# Patient Record
Sex: Female | Born: 1935 | Race: Black or African American | Hispanic: No | Smoking: Never smoker
Health system: Southern US, Community
[De-identification: ages and names within clinical notes are randomized; demographics above are authoritative.]

## PROBLEM LIST (undated history)

## (undated) DIAGNOSIS — H269 Unspecified cataract: Secondary | ICD-10-CM

## (undated) DIAGNOSIS — E785 Hyperlipidemia, unspecified: Secondary | ICD-10-CM

## (undated) DIAGNOSIS — I10 Essential (primary) hypertension: Secondary | ICD-10-CM

## (undated) HISTORY — PX: BILATERAL OOPHORECTOMY: SHX1221

## (undated) HISTORY — PX: APPENDECTOMY: SHX54

## (undated) HISTORY — DX: Unspecified cataract: H26.9

## (undated) HISTORY — DX: Hyperlipidemia, unspecified: E78.5

## (undated) HISTORY — PX: ABDOMINAL HYSTERECTOMY: SHX81

---

## 1994-03-04 HISTORY — PX: CATARACT EXTRACTION, BILATERAL: SHX1313

## 1997-11-30 ENCOUNTER — Other Ambulatory Visit: Admission: RE | Admit: 1997-11-30 | Discharge: 1997-11-30 | Payer: Self-pay | Admitting: Obstetrics & Gynecology

## 1998-12-01 ENCOUNTER — Other Ambulatory Visit: Admission: RE | Admit: 1998-12-01 | Discharge: 1998-12-01 | Payer: Self-pay | Admitting: Obstetrics & Gynecology

## 1999-01-14 ENCOUNTER — Emergency Department (HOSPITAL_COMMUNITY): Admission: EM | Admit: 1999-01-14 | Discharge: 1999-01-14 | Payer: Self-pay | Admitting: Emergency Medicine

## 1999-09-04 ENCOUNTER — Encounter (INDEPENDENT_AMBULATORY_CARE_PROVIDER_SITE_OTHER): Payer: Self-pay | Admitting: Specialist

## 1999-09-04 ENCOUNTER — Other Ambulatory Visit: Admission: RE | Admit: 1999-09-04 | Discharge: 1999-09-04 | Payer: Self-pay | Admitting: Internal Medicine

## 1999-12-03 ENCOUNTER — Other Ambulatory Visit: Admission: RE | Admit: 1999-12-03 | Discharge: 1999-12-03 | Payer: Self-pay | Admitting: Obstetrics & Gynecology

## 2000-01-09 ENCOUNTER — Emergency Department (HOSPITAL_COMMUNITY): Admission: EM | Admit: 2000-01-09 | Discharge: 2000-01-09 | Payer: Self-pay | Admitting: Emergency Medicine

## 2000-01-09 ENCOUNTER — Encounter: Payer: Self-pay | Admitting: Emergency Medicine

## 2000-06-26 ENCOUNTER — Emergency Department (HOSPITAL_COMMUNITY): Admission: EM | Admit: 2000-06-26 | Discharge: 2000-06-26 | Payer: Self-pay | Admitting: Internal Medicine

## 2001-01-08 ENCOUNTER — Other Ambulatory Visit: Admission: RE | Admit: 2001-01-08 | Discharge: 2001-01-08 | Payer: Self-pay | Admitting: Obstetrics & Gynecology

## 2002-02-01 ENCOUNTER — Other Ambulatory Visit: Admission: RE | Admit: 2002-02-01 | Discharge: 2002-02-01 | Payer: Self-pay | Admitting: Obstetrics & Gynecology

## 2003-03-18 ENCOUNTER — Other Ambulatory Visit: Admission: RE | Admit: 2003-03-18 | Discharge: 2003-03-18 | Payer: Self-pay | Admitting: Obstetrics & Gynecology

## 2003-03-24 ENCOUNTER — Emergency Department (HOSPITAL_COMMUNITY): Admission: AD | Admit: 2003-03-24 | Discharge: 2003-03-24 | Payer: Self-pay | Admitting: Emergency Medicine

## 2003-11-01 ENCOUNTER — Encounter: Admission: RE | Admit: 2003-11-01 | Discharge: 2003-11-01 | Payer: Self-pay | Admitting: Internal Medicine

## 2004-09-06 ENCOUNTER — Ambulatory Visit: Payer: Self-pay | Admitting: Internal Medicine

## 2004-09-18 ENCOUNTER — Encounter (INDEPENDENT_AMBULATORY_CARE_PROVIDER_SITE_OTHER): Payer: Self-pay | Admitting: Specialist

## 2004-09-18 ENCOUNTER — Ambulatory Visit: Payer: Self-pay | Admitting: Internal Medicine

## 2004-09-18 ENCOUNTER — Encounter (INDEPENDENT_AMBULATORY_CARE_PROVIDER_SITE_OTHER): Payer: Self-pay | Admitting: *Deleted

## 2005-04-11 ENCOUNTER — Other Ambulatory Visit: Admission: RE | Admit: 2005-04-11 | Discharge: 2005-04-11 | Payer: Self-pay | Admitting: Obstetrics & Gynecology

## 2006-02-22 ENCOUNTER — Emergency Department (HOSPITAL_COMMUNITY): Admission: EM | Admit: 2006-02-22 | Discharge: 2006-02-22 | Payer: Self-pay | Admitting: Emergency Medicine

## 2006-04-19 ENCOUNTER — Emergency Department (HOSPITAL_COMMUNITY): Admission: EM | Admit: 2006-04-19 | Discharge: 2006-04-19 | Payer: Self-pay | Admitting: Family Medicine

## 2006-10-11 ENCOUNTER — Emergency Department (HOSPITAL_COMMUNITY): Admission: EM | Admit: 2006-10-11 | Discharge: 2006-10-11 | Payer: Self-pay | Admitting: Family Medicine

## 2008-02-10 ENCOUNTER — Emergency Department (HOSPITAL_COMMUNITY): Admission: EM | Admit: 2008-02-10 | Discharge: 2008-02-10 | Payer: Self-pay | Admitting: Emergency Medicine

## 2009-02-07 ENCOUNTER — Emergency Department (HOSPITAL_COMMUNITY): Admission: EM | Admit: 2009-02-07 | Discharge: 2009-02-07 | Payer: Self-pay | Admitting: Emergency Medicine

## 2009-02-19 ENCOUNTER — Emergency Department (HOSPITAL_COMMUNITY): Admission: EM | Admit: 2009-02-19 | Discharge: 2009-02-19 | Payer: Self-pay | Admitting: Emergency Medicine

## 2009-03-18 ENCOUNTER — Encounter: Admission: RE | Admit: 2009-03-18 | Discharge: 2009-03-18 | Payer: Self-pay | Admitting: Orthopedic Surgery

## 2009-08-15 ENCOUNTER — Encounter (INDEPENDENT_AMBULATORY_CARE_PROVIDER_SITE_OTHER): Payer: Self-pay | Admitting: *Deleted

## 2010-02-21 ENCOUNTER — Emergency Department (HOSPITAL_COMMUNITY)
Admission: EM | Admit: 2010-02-21 | Discharge: 2010-02-21 | Payer: Self-pay | Source: Home / Self Care | Admitting: Family Medicine

## 2010-03-06 ENCOUNTER — Encounter
Admission: RE | Admit: 2010-03-06 | Discharge: 2010-03-06 | Payer: Self-pay | Source: Home / Self Care | Attending: Family Medicine | Admitting: Family Medicine

## 2010-04-03 NOTE — Letter (Signed)
Summary: Colonoscopy Date Change Letter  [facility]  [address] [place], [place] [address]   Phone: [phone]  Fax: [phone]      August 15, 2009 MRN: [id number]   Abbygail Ahrendt [address] [place], [place]  [address]   Dear Ms. Karczewski,   Previously you were recommended to have a repeat colonoscopy around this time. Your chart was recently reviewed by Dr. Lina Sar of [facility]. Follow up colonoscopy is now recommended in July 2013. This revised recommendation is based on current, nationally recognized guidelines for colorectal cancer screening and polyp surveillance. These guidelines are endorsed by the American Cancer Society, [employer] on Colorectal Cancer as well as numerous other major medical organizations.  Please understand that our recommendation assumes that you do not have any new symptoms such as bleeding, a change in bowel habits, anemia, or significant abdominal discomfort. If you do have any concerning GI symptoms or want to discuss the guideline recommendations, please call to arrange an office visit at your earliest convenience. Otherwise we will keep you in our reminder system and contact you 1-2 months prior to the date listed above to schedule your next colonoscopy.  Thank you,  Hedwig Morton. Juanda Chance, M.D.  [facility] [phone]

## 2010-04-20 ENCOUNTER — Emergency Department (HOSPITAL_COMMUNITY): Payer: Medicare Other

## 2010-04-20 ENCOUNTER — Emergency Department (HOSPITAL_COMMUNITY)
Admission: EM | Admit: 2010-04-20 | Discharge: 2010-04-20 | Disposition: A | Discharge status: Home / Self Care | Payer: Medicare Other | Attending: Emergency Medicine | Admitting: Emergency Medicine

## 2010-04-20 ENCOUNTER — Encounter (INDEPENDENT_AMBULATORY_CARE_PROVIDER_SITE_OTHER): Payer: Self-pay | Admitting: *Deleted

## 2010-04-20 DIAGNOSIS — K5900 Constipation, unspecified: Secondary | ICD-10-CM | POA: Insufficient documentation

## 2010-04-20 DIAGNOSIS — R110 Nausea: Secondary | ICD-10-CM | POA: Insufficient documentation

## 2010-04-20 DIAGNOSIS — I10 Essential (primary) hypertension: Secondary | ICD-10-CM | POA: Insufficient documentation

## 2010-04-20 DIAGNOSIS — R1011 Right upper quadrant pain: Secondary | ICD-10-CM | POA: Insufficient documentation

## 2010-04-20 DIAGNOSIS — R1033 Periumbilical pain: Secondary | ICD-10-CM | POA: Insufficient documentation

## 2010-04-20 LAB — COMPREHENSIVE METABOLIC PANEL
ALT: 10 U/L (ref 0–35)
Alkaline Phosphatase: 42 U/L (ref 39–117)
CO2: 31 mEq/L (ref 19–32)
Chloride: 101 mEq/L (ref 96–112)
Glucose, Bld: 111 mg/dL — ABNORMAL HIGH (ref 70–99)
Potassium: 4.1 mEq/L (ref 3.5–5.1)
Sodium: 140 mEq/L (ref 135–145)
Total Bilirubin: 1 mg/dL (ref 0.3–1.2)
Total Protein: 6.6 g/dL (ref 6.0–8.3)

## 2010-04-20 LAB — DIFFERENTIAL
Basophils Absolute: 0 10*3/uL (ref 0.0–0.1)
Basophils Relative: 0 % (ref 0–1)
Lymphocytes Relative: 28 % (ref 12–46)
Neutro Abs: 5.3 10*3/uL (ref 1.7–7.7)
Neutrophils Relative %: 63 % (ref 43–77)

## 2010-04-20 LAB — URINALYSIS, ROUTINE W REFLEX MICROSCOPIC
Bilirubin Urine: NEGATIVE
Hgb urine dipstick: NEGATIVE
Specific Gravity, Urine: 1.016 (ref 1.005–1.030)
Urobilinogen, UA: 1 mg/dL (ref 0.0–1.0)

## 2010-04-20 LAB — CBC
Hemoglobin: 13.7 g/dL (ref 12.0–15.0)
RBC: 5.17 MIL/uL — ABNORMAL HIGH (ref 3.87–5.11)
WBC: 8.4 10*3/uL (ref 4.0–10.5)

## 2010-04-20 MED ORDER — IOHEXOL 300 MG/ML  SOLN
100.0000 mL | Freq: Once | INTRAMUSCULAR | Status: AC | PRN
  Administered 2010-04-20: 100 mL via INTRAVENOUS

## 2010-05-14 LAB — URINE CULTURE: Culture  Setup Time: 201112211856

## 2010-05-14 LAB — POCT URINALYSIS DIPSTICK
Bilirubin Urine: NEGATIVE
Hgb urine dipstick: NEGATIVE
Ketones, ur: NEGATIVE mg/dL
Nitrite: NEGATIVE
pH: 6 (ref 5.0–8.0)

## 2010-05-14 LAB — URINALYSIS, ROUTINE W REFLEX MICROSCOPIC
Bilirubin Urine: NEGATIVE
Hgb urine dipstick: NEGATIVE
Specific Gravity, Urine: 1.014 (ref 1.005–1.030)
pH: 6 (ref 5.0–8.0)

## 2010-05-17 ENCOUNTER — Ambulatory Visit: Payer: Medicare Other | Admitting: Physician Assistant

## 2010-05-17 ENCOUNTER — Telehealth: Payer: Self-pay | Admitting: Internal Medicine

## 2010-05-18 ENCOUNTER — Encounter: Payer: Self-pay | Admitting: Internal Medicine

## 2010-05-18 ENCOUNTER — Encounter: Payer: Self-pay | Admitting: Physician Assistant

## 2010-05-18 ENCOUNTER — Other Ambulatory Visit: Payer: Self-pay | Admitting: Physician Assistant

## 2010-05-18 ENCOUNTER — Other Ambulatory Visit: Payer: Medicare Other

## 2010-05-18 ENCOUNTER — Ambulatory Visit (INDEPENDENT_AMBULATORY_CARE_PROVIDER_SITE_OTHER): Payer: Medicare Other | Admitting: Physician Assistant

## 2010-05-18 DIAGNOSIS — I10 Essential (primary) hypertension: Secondary | ICD-10-CM | POA: Insufficient documentation

## 2010-05-18 DIAGNOSIS — Z860100 Personal history of colon polyps, unspecified: Secondary | ICD-10-CM | POA: Insufficient documentation

## 2010-05-18 DIAGNOSIS — R1031 Right lower quadrant pain: Secondary | ICD-10-CM

## 2010-05-18 LAB — CBC WITH DIFFERENTIAL/PLATELET
Basophils Absolute: 0 10*3/uL (ref 0.0–0.1)
Eosinophils Absolute: 0.1 10*3/uL (ref 0.0–0.7)
Lymphocytes Relative: 27.6 % (ref 12.0–46.0)
MCHC: 33.5 g/dL (ref 30.0–36.0)
Neutrophils Relative %: 62.7 % (ref 43.0–77.0)
RDW: 14.1 % (ref 11.5–14.6)

## 2010-05-22 NOTE — Letter (Signed)
Summary: [facility] Instructions  [facility]  [address] [place], [place] [address]   Phone: [phone]  Fax: [phone]       Lauren Holloway    Jan 07, 1932    MRN: [id number]        Procedure Day /Date: 05-24-2010     Arrival Time: 12:30 PM      Procedure Time: 1:30 PM     Location of Procedure:                    X      [facility] (4th Floor)   PREPARATION FOR COLONOSCOPY WITH MOVIPREP   Starting 5 days prior to your procedure 05-19-2010 do not eat nuts, seeds, popcorn, corn, beans, peas,  salads, or any raw vegetables.  Do not take any fiber supplements (e.g. Metamucil, Citrucel, and Benefiber).  THE DAY BEFORE YOUR PROCEDURE         DATE: 05-23-2010  DAY: Wednesday  1.  Drink clear liquids the entire day-NO SOLID FOOD  2.  Do not drink anything colored red or purple.  Avoid juices with pulp.  No orange juice.  3.  Drink at least 64 oz. (8 glasses) of fluid/clear liquids during the day to prevent dehydration and help the prep work efficiently.  CLEAR LIQUIDS INCLUDE: Water Jello Ice Popsicles Tea (sugar ok, no milk/cream) Powdered fruit flavored drinks Coffee (sugar ok, no milk/cream) Gatorade Juice: apple, white grape, white cranberry  Lemonade Clear bullion, consomm, broth Carbonated beverages (any kind) Strained chicken noodle soup Hard Candy                             4.  In the morning, mix first dose of MoviPrep solution:    Empty 1 Pouch A and 1 Pouch B into the disposable container    Add lukewarm drinking water to the top line of the container. Mix to dissolve    Refrigerate (mixed solution should be used within 24 hrs)  5.  Begin drinking the prep at 5:00 p.m. The MoviPrep container is divided by 4 marks.   Every 15 minutes drink the solution down to the next mark (approximately 8 oz) until the full liter is complete.   6.  Follow completed prep with 16 oz of clear liquid of your choice (Nothing red or purple).   Continue to drink clear liquids until bedtime.  7.  Before going to bed, mix second dose of MoviPrep solution:    Empty 1 Pouch A and 1 Pouch B into the disposable container    Add lukewarm drinking water to the top line of the container. Mix to dissolve    Refrigerate  THE DAY OF YOUR PROCEDURE      DATE: 05-24-2010 DAY: Thursday  Beginning at 8:30 AM  (5 hours before procedure):         1. Every 15 minutes, drink the solution down to the next mark (approx 8 oz) until the full liter is complete.  2. Follow completed prep with 16 oz. of clear liquid of your choice.    3. You may drink clear liquids until 11:30 PM  (2 HOURS BEFORE PROCEDURE).   MEDICATION INSTRUCTIONS  Unless otherwise instructed, you should take regular prescription medications with a small sip of water   as early as possible the morning of your procedure.  Diabetic patients - see separate instructions.  Stop taking Plavix or Aggrenox on  _  _  (7 days before procedure).     Stop taking Coumadin on  _ _  (5 days before procedure).  Additional medication instructions: _         OTHER INSTRUCTIONS  You will need a responsible adult at least 75 years of age to accompany you and drive you home.   This person must remain in the waiting room during your procedure.  Wear loose fitting clothing that is easily removed.  Leave jewelry and other valuables at home.  However, you may wish to bring a book to read or  an iPod/MP3 player to listen to music as you wait for your procedure to start.  Remove all body piercing jewelry and leave at home.  Total time from sign-in until discharge is approximately 2-3 hours.  You should go home directly after your procedure and rest.  You can resume normal activities the  day after your procedure.  The day of your procedure you should not:   Drive   Make legal decisions   Operate machinery   Drink alcohol   Return to work  You will receive specific  instructions about eating, activities and medications before you leave.    The above instructions have been reviewed and explained to me by   _______________________    I fully understand and can verbalize these instructions _____________________________ Date _________

## 2010-05-22 NOTE — Progress Notes (Signed)
Summary: triage  Phone Note Call from Patient Call back at Home Phone [phone]   Caller: Patient Call For: Dr. Juanda Chance Reason for Call: Talk to Nurse Summary of Call: Clydie Braun at Dr. Nigel Mormon office is calling to set the patient up an appt for persistent right Upper quad pain, we can call the patient to schedule Initial call taken by: [place] Johnson,  May 17, 2010 10:12 AM  Follow-up for Phone Call        Scheduled patient with Mike Gip, PA on 05/18/10 at 11:00 AM. Message left for patient to call back.Jesse Fall, RN 05/17/10 11:09 AM Follow-up by: Jesse Fall RN,  May 17, 2010 11:10 AM  Additional Follow-up for Phone Call Additional follow up Details #1::        Spoke with patient and gave her the appointment date and time.  Additional Follow-up by: Jesse Fall RN,  May 17, 2010 1:35 PM    Additional Follow-up for Phone Call Additional follow up Details #2::    OK Follow-up by: Hart Carwin MD,  May 17, 2010 11:49 PM

## 2010-05-22 NOTE — Procedures (Signed)
Summary: colonoscopy  Patient Name: Lauren Holloway, Lauren Holloway MRN:  Procedure Procedures: Colonoscopy CPT: [id number].    with biopsy. CPT: Q5068410.  Personnel: Endoscopist: Delainee Tramel L. Juanda Chance, MD.  Exam Location: Exam performed in Outpatient Clinic. Outpatient  Patient Consent: Procedure, Alternatives, Risks and Benefits discussed, consent obtained, from patient. Consent was obtained by the RN.  Indications  Surveillance of: Adenomatous Polyp(s). Initial polypectomy was performed in 2001. 1-2 Polyps were found at Index Exam. Largest polyp removed was 6 to 9 mm. Prior polyp located in proximal (splenic flexure and beyond) colon. Pathology of worst  polyp: tubular adenoma.  History  Current Medications: Patient is not currently taking Coumadin.  Pre-Exam Physical: Performed Sep 18, 2004. Entire physical exam was normal.  Exam Exam: Extent of exam reached: Cecum, extent intended: Cecum.  The cecum was identified by appendiceal orifice and IC valve. Colon retroflexion performed. Images taken. ASA Classification: I. Tolerance: good.  Monitoring: Pulse and BP monitoring, Oximetry used. Supplemental O2 given.  Colon Prep Used Miralax for colon prep. Prep results: good.  Sedation Meds: Patient assessed and found to be appropriate for moderate (conscious) sedation. Fentanyl 75 mcg. given IV. Versed 8 mg. given IV.  Findings - MUCOSAL ABNORMALITY: Cecum. Biopsy/Mucosal Abn. taken. Comments: nonspesific erythema of the ileocecal valve, may be scope related.  - NORMAL EXAM: Cecum.  - NORMAL EXAM: Rectum.   Assessment Normal examination.  Comments: no recurrent polyps, s/p biopsies of the ileocecal valve Events  Unplanned Interventions: No intervention was required.  Unplanned Events: There were no complications. Plans Medication Plan: Await pathology.  Patient Education: Patient given standard instructions for: Yearly hemoccult testing recommended. Patient instructed to get routine  colonoscopy every 5-7 years.  Disposition: After procedure patient sent to recovery. After recovery patient sent  cc: Melanie B. Daphine Deutscher, MD   This report was created from the original endoscopy report, which was reviewed and signed by the above listed endoscopist.

## 2010-05-22 NOTE — Assessment & Plan Note (Signed)
Summary: RUQ pain referrred by Dr. Nash Dimmer) hx Juanda Chance, no show, cop...   History of Present Illness Visit Type: Initial Consult Primary GI MD: Lina Sar MD Primary Provider: Loetta Rough Requesting Provider: n/a Chief Complaint: RLQ Pain History of Present Illness:    This is a very nice 77 year old female known to Dr. Juanda Chance from prior colonoscopies. She was last seen in 2006 at which time colonoscopy was normal with no evidence of recurrent polyps she did have some erythema at the ileocecal valve this was biopsied and was benign. She does have history of previous adenomatous polyps.    patient is status post appendectomy and hysterectomy with BSO remotely.    She comes in today with complaints of acute right lower quadrant pain present over the past 3 weeks worse over the past one week by her report. She states the pain is constant sore and aching without radiation. She has not had any fever or chills no diarrhea or real change in her bowel habits. She does say that she feels better after a bowel movement. She has also been feeling a gassy and uncomfortable. She has not noted any melena or hematochezia. She does not feel that she evacuates her bowels very well. She has not had any known recent injury straining etc. Her appetite has been fine and she has not noticed any change in his right lower quadrant pain with by mouth intake.  Denies urgency ,frequency, dysuria.  She did have an ER visit on February 17 when the pain initially began. Review of those records shows a normal CBC ,cmet  and lipase. She had upper abdominal ultrasound which was negative and then a CT scan of the abdomen and pelvis on 2/17 with contrast which was also unremarkable.   GI Review of Systems    Reports abdominal pain, bloating, and  nausea.     Location of  Abdominal pain: RLQ.    Denies acid reflux, belching, chest pain, dysphagia with liquids, dysphagia with solids, heartburn, loss of appetite, vomiting,  vomiting blood, weight loss, and  weight gain.      Reports constipation.     Denies anal fissure, black tarry stools, change in bowel habit, diarrhea, diverticulosis, fecal incontinence, heme positive stool, hemorrhoids, irritable bowel syndrome, jaundice, light color stool, liver problems, rectal bleeding, and  rectal pain. Preventive Screening-Counseling & Management  Alcohol-Tobacco     Smoking Status: never      Drug Use:  no.      Current Medications (verified): 1)  Norvasc 5 Mg Tabs (Amlodipine Besylate) .Marland Kitchen.. 1 By Mouth Once Daily 2)  Toprol Xl 25 Mg Xr24h-Tab (Metoprolol Succinate) .Marland Kitchen.. 1 By Mouth Once Daily 3)  Klor-Con 10 10 Meq Cr-Tabs (Potassium Chloride) .Marland Kitchen.. 1 By Mouth Once Daily 4)  Premarin 1.25 Mg Tabs (Estrogens Conjugated) .Marland Kitchen.. 1 By Mouth Once Daily 5)  Lisinopril-Hydrochlorothiazide 20-12.5 Mg Tabs (Lisinopril-Hydrochlorothiazide) .Marland Kitchen.. 1 By Mouth Once Daily  Allergies (verified): 1)  ! Sulfa  Past History:  Past Medical History: Hypertension ADENOMATOUS COLON POLYPS  Past Surgical History: Hysterectomy Appendectomy COLONOSCOPY 2006-DR. BRODIE  Family History: Family History of Ovarian Cancer: mother Family History of Diabetes: sister BROTHER-COLON/PROSTATE CA  Social History: retired Runner, broadcasting/film/video Occupation:  Patient has never smoked.  Alcohol Use - no Illicit Drug Use - no Smoking Status:  never Drug Use:  no  Review of Systems       The patient complains of night sweats and sleeping problems.  The patient denies allergy/sinus, anemia, anxiety-new, arthritis/joint  pain, back pain, blood in urine, breast changes/lumps, change in vision, confusion, cough, coughing up blood, depression-new, fainting, fatigue, fever, headaches-new, hearing problems, heart murmur, heart rhythm changes, itching, menstrual pain, muscle pains/cramps, nosebleeds, pregnancy symptoms, shortness of breath, skin rash, sore throat, swelling of feet/legs, swollen lymph glands,  thirst - excessive , urination - excessive , urination changes/pain, urine leakage, vision changes, and voice change.         SEE HPI  Vital Signs:  Patient profile:   77 year old female Height:      62 inches Weight:      151 pounds BMI:     27.72 BSA:     1.70 Pulse rate:   60 / minute Pulse rhythm:   regular BP sitting:   122 / 68  (left arm)  Vitals Entered By: Merri Ray CMA Duncan Dull) (May 18, 2010 10:47 AM)  Physical Exam  General:  Well developed, well nourished, no acute distress. Head:  Normocephalic and atraumatic. Eyes:  PERRLA, no icterus. Lungs:  Clear throughout to auscultation. Heart:  Regular rate and rhythm; no murmurs, rubs,  or bruits. Abdomen:  SOFT, TENDER RLQ, NO PALP MASS OR HSM,BS+, NO GUARDING OR REBOUND Rectal:  HEME NEG, Extremities:  No clubbing, cyanosis, edema or deformities noted. Neurologic:  Alert and  oriented x4;  grossly normal neurologically. Psych:  Alert and cooperative. Normal mood and affect.   Impression & Recommendations:  Problem # 1:  ABDOMINAL PAIN RIGHT LOWER QUADRANT (ICD-789.03) Assessment New 77 YO FEMALE  WITH 3 WEEK HX OF RLQ PAIN. ETIOLOGY NOT CLEAR. NEGATIVE CT ABD/PELVIS  R/O ADHESIONS,CONSTIPATION,OCCULT COLON LESION.  ULTRAM 50 MG Q 6 HOURS as needed PAIN SCHEDULE FOR  COLONOSCOPY WITH DR. Juanda Chance  -DISCUSSED IN DETAIL WITH PT. CBC,CRP TODAY Orders: Colonoscopy (Colon) TLB-CRP-High Sensitivity (C-Reactive Protein) (86140-FCRP) TLB-CBC Platelet - w/Differential (85025-CBCD)  Problem # 2:  HYPERTENSION (ICD-401.9) Assessment: Comment Only  Problem # 3:  PERSONAL HISTORY OF COLONIC POLYPS (ICD-V12.72) Assessment: Comment Only ADENOMATOUS 2001,NEGATIVE 2006  COLONOSCOPY SCHEDULED AS ABOVE Orders: Colonoscopy (Colon) TLB-CRP-High Sensitivity (C-Reactive Protein) (86140-FCRP) TLB-CBC Platelet - w/Differential (85025-CBCD)  Problem # 4:  FAMILY HX COLON CANCER (ICD-V16.0) Assessment: Comment  Only BROTHER  Patient Instructions: 1)  Please go to lab, basement level. 2)  We sent prescriptions for Ultram and the colonoscopy prep to [facility] [address] 3)  We scheduled the the Colonoscopy with Dr. Lina Sar on 05-24-2010. 4)  Directions and brochure provided. 5)  [facility] Patient Information Guide given to patient. 6)  Copy sent to : Dr. Lowella Grip 7)  The medication list was reviewed and reconciled.  All changed / newly prescribed medications were explained.  A complete medication list was provided to the patient / caregiver. Prescriptions: MOVIPREP 100 GM  SOLR (PEG-KCL-NACL-NASULF-NA ASC-C) As per prep instructions.  #1 x 0   Entered by:   Lowry Ram NCMA   Authorized by:   Sammuel Cooper PA-c   Signed by:   Lowry Ram NCMA on 05/18/2010   Method used:   [facility]        [facility]  [address] [phone]* (retail)       [address]       [place], [place]  [id number]       Ph: [phone] or [phone]       Fax: [phone]   RxID:   [id number] ULTRAM 50 MG TABS (TRAMADOL HCL)  Take 1 tab every 6 hours as needed for pain  #20 x 0   Entered by:   Lowry Ram NCMA   Authorized by:   Sammuel Cooper PA-c   Signed by:   Lowry Ram NCMA on 05/18/2010   Method used:   [facility]        [facility]  [address] [phone]* (retail)       [address]       [place], [place]  [id number]       Ph: [phone] or [phone]       Fax: [phone]   RxID:   [id number]

## 2010-05-22 NOTE — Procedures (Signed)
Summary: [facility] COLON  COLONOSCOPY/[facility]C 05-24-2010 Time 2:00 PM  Dr Lina Sar

## 2010-05-23 ENCOUNTER — Encounter: Payer: Self-pay | Admitting: *Deleted

## 2010-05-24 ENCOUNTER — Encounter: Payer: Self-pay | Admitting: Internal Medicine

## 2010-05-24 ENCOUNTER — Ambulatory Visit (AMBULATORY_SURGERY_CENTER): Payer: Medicare Other | Admitting: Internal Medicine

## 2010-05-24 DIAGNOSIS — R1031 Right lower quadrant pain: Secondary | ICD-10-CM

## 2010-05-24 DIAGNOSIS — Z860100 Personal history of colon polyps, unspecified: Secondary | ICD-10-CM

## 2010-05-24 DIAGNOSIS — D126 Benign neoplasm of colon, unspecified: Secondary | ICD-10-CM

## 2010-05-24 DIAGNOSIS — R109 Unspecified abdominal pain: Secondary | ICD-10-CM

## 2010-05-24 MED ORDER — DICYCLOMINE HCL 10 MG PO CAPS: 10.0000 mg | ORAL_CAPSULE | Freq: Two times a day (BID) | ORAL | Status: DC

## 2010-05-24 NOTE — Patient Instructions (Signed)
1 small polyp Otherwise normal exam High Fiber diet Bentyl 10 mg 1 by mouth twice a day Await pathology results  Repeat colonoscopy in 10 yrs

## 2010-05-24 NOTE — Progress Notes (Signed)
Vital signs unintentionally erased before printing.  Vital signs were stable throughout recovery time with normal BP, HR and O2 sats.

## 2010-05-25 ENCOUNTER — Telehealth: Payer: Self-pay | Admitting: *Deleted

## 2010-05-25 NOTE — Telephone Encounter (Signed)

## 2010-05-29 ENCOUNTER — Encounter: Payer: Self-pay | Admitting: Internal Medicine

## 2010-05-30 ENCOUNTER — Encounter: Payer: Self-pay | Admitting: Internal Medicine

## 2010-05-31 NOTE — Procedures (Addendum)
Summary: Colonoscopy  Patient: [name] Note: All result statuses are Final unless otherwise noted.  Tests: (1) Colonoscopy (COL)   COL Colonoscopy           DONE      [facility]     [address]     [place], [place]  [address]          COLONOSCOPY PROCEDURE REPORT          PATIENT:  Lauren Holloway, Lauren Holloway  MR#:  [id number]     BIRTHDATE:  [birth year], 79 yrs. old  GENDER:  female     ENDOSCOPIST:  Hedwig Morton. Juanda Chance, MD     REF. BY:  Varney Baas, M.D.     PROCEDURE DATE:  05/24/2010     PROCEDURE:  Colonoscopy 295[address]     ASA CLASS:  Class II     INDICATIONS:  Abdominal pain RLQ abd. pain,     last colon 2006     hx ofadenom. polyp,cecum,2001     normal colon 1994     MEDICATIONS:   Versed 10 mg, Fentanyl 75 mcg          DESCRIPTION OF PROCEDURE:   After the risks benefits and     alternatives of the procedure were thoroughly explained, informed     consent was obtained.  Digital rectal exam was performed and     revealed no rectal masses.   The LB [id number] endoscope     was introduced through the anus and advanced to the cecum, which     was identified by both the appendix and ileocecal valve, without     limitations.  The quality of the prep was good, using MoviPrep.     The instrument was then slowly withdrawn as the colon was fully     examined.     <<PROCEDUREIMAGES>>          FINDINGS:  A diminutive polyp was found. at 60 cm 2mm polyp The     polyp was removed using cold biopsy forceps (see image4).  This     was otherwise a normal examination of the colon (see image5,     image3, image2, and image1).  normal cecum.   Retroflexed views in     the rectum revealed no abnormalities.    The scope was then     withdrawn from the patient and the procedure completed.          COMPLICATIONS:  None     ENDOSCOPIC IMPRESSION:     1) Diminutive polyp     2) Otherwise normal examination     3) Normal cecum     no structural abnormality of the cecum and right  colon,     RECOMMENDATIONS:     1) high fiber diet     2) Await pathology results     trial of Bentyl 10mg , #30, 1 po bid, 1 refill,     REPEAT EXAM:  In 10 year(s) for.          ______________________________     Hedwig Morton. Juanda Chance, MD          CC:  Renaye Rakers, MD          n.     Rosalie DoctorHedwig Morton. Brodie at 05/24/2010 02:46 PM          Valinda Party, [phone]  Note: An exclamation mark (!) indicates a result that was not dispersed into the flowsheet. Document  Creation Date: 05/30/2010 11:38 AM _______________________________________________________________________  (1) Order result status: Final Collection or observation date-time: 05/24/2010 14:36 Requested date-time:  Receipt date-time:  Reported date-time:  Referring Physician:   Ordering Physician: Lina Sar [phone]) Specimen Source:  Source: Launa Grill Order Number: [id number] Lab site:

## 2010-09-06 ENCOUNTER — Emergency Department (HOSPITAL_COMMUNITY)
Admission: EM | Admit: 2010-09-06 | Discharge: 2010-09-07 | Disposition: A | Discharge status: Home / Self Care | Payer: Medicare Other | Attending: Emergency Medicine | Admitting: Emergency Medicine

## 2010-09-06 DIAGNOSIS — I10 Essential (primary) hypertension: Secondary | ICD-10-CM | POA: Insufficient documentation

## 2010-09-06 DIAGNOSIS — M5380 Other specified dorsopathies, site unspecified: Secondary | ICD-10-CM | POA: Insufficient documentation

## 2010-09-06 DIAGNOSIS — Z79899 Other long term (current) drug therapy: Secondary | ICD-10-CM | POA: Insufficient documentation

## 2010-11-02 ENCOUNTER — Other Ambulatory Visit: Payer: Self-pay | Admitting: Neurosurgery

## 2010-11-02 DIAGNOSIS — M542 Cervicalgia: Secondary | ICD-10-CM

## 2010-11-12 ENCOUNTER — Ambulatory Visit
Admission: RE | Admit: 2010-11-12 | Discharge: 2010-11-12 | Disposition: A | Discharge status: Home / Self Care | Payer: Medicare Other | Source: Ambulatory Visit | Attending: Neurosurgery | Admitting: Neurosurgery

## 2010-11-12 DIAGNOSIS — M542 Cervicalgia: Secondary | ICD-10-CM

## 2010-11-15 ENCOUNTER — Ambulatory Visit
Admission: RE | Admit: 2010-11-15 | Discharge: 2010-11-15 | Disposition: A | Discharge status: Home / Self Care | Payer: Medicare Other | Source: Ambulatory Visit | Attending: Neurosurgery | Admitting: Neurosurgery

## 2010-11-16 ENCOUNTER — Other Ambulatory Visit: Payer: Medicare Other

## 2010-12-17 LAB — POCT URINALYSIS DIP (DEVICE)
Bilirubin Urine: NEGATIVE
Ketones, ur: NEGATIVE
Operator id: 247071
Protein, ur: NEGATIVE
Specific Gravity, Urine: 1.01
pH: 7

## 2011-05-11 ENCOUNTER — Emergency Department (INDEPENDENT_AMBULATORY_CARE_PROVIDER_SITE_OTHER): Payer: Medicare Other

## 2011-05-11 ENCOUNTER — Emergency Department (INDEPENDENT_AMBULATORY_CARE_PROVIDER_SITE_OTHER)
Admission: EM | Admit: 2011-05-11 | Discharge: 2011-05-11 | Disposition: A | Discharge status: Home / Self Care | Payer: Medicare Other | Source: Home / Self Care | Attending: Emergency Medicine | Admitting: Emergency Medicine

## 2011-05-11 ENCOUNTER — Encounter (HOSPITAL_COMMUNITY): Payer: Self-pay | Admitting: Emergency Medicine

## 2011-05-11 ENCOUNTER — Other Ambulatory Visit: Payer: Self-pay

## 2011-05-11 DIAGNOSIS — B029 Zoster without complications: Secondary | ICD-10-CM

## 2011-05-11 HISTORY — DX: Essential (primary) hypertension: I10

## 2011-05-11 MED ORDER — HYDROMORPHONE HCL PF 1 MG/ML IJ SOLN
INTRAMUSCULAR | Status: AC
  Filled 2011-05-11: qty 1

## 2011-05-11 MED ORDER — ACYCLOVIR 400 MG PO TABS: 800.0000 mg | ORAL_TABLET | ORAL | Status: AC

## 2011-05-11 MED ORDER — ONDANSETRON 4 MG PO TBDP
ORAL_TABLET | ORAL | Status: AC
  Filled 2011-05-11: qty 2

## 2011-05-11 MED ORDER — OXYCODONE-ACETAMINOPHEN 5-325 MG PO TABS: ORAL_TABLET | ORAL | Status: AC

## 2011-05-11 MED ORDER — ONDANSETRON 4 MG PO TBDP
8.0000 mg | ORAL_TABLET | Freq: Once | ORAL | Status: AC
  Administered 2011-05-11: 8 mg via ORAL

## 2011-05-11 MED ORDER — HYDROMORPHONE HCL PF 1 MG/ML IJ SOLN
1.0000 mg | Freq: Once | INTRAMUSCULAR | Status: AC
  Administered 2011-05-11: 1 mg via INTRAMUSCULAR

## 2011-05-11 MED ORDER — PREDNISONE 5 MG PO KIT: 1.0000 | PACK | Freq: Every day | ORAL | Status: DC

## 2011-05-11 NOTE — ED Notes (Signed)
Patient has notified friend to pick her up, waiting for her arrival

## 2011-05-11 NOTE — ED Notes (Signed)
Reports right shoulder pain, reports pain into back, down to mid back and pain under breast.  No known injury.  Reports dull pain.  Movement of right arm changes pain in shoulder/back/chest.  Pain is constant.  Patient reports pain med helps, but pain returns, pain seems particularly bad at night.  Patient reports similar pain in left shoulder, this was 2 years ago, seen here, given injection.  Denies sob, nausea, vomiting.

## 2011-05-11 NOTE — Discharge Instructions (Signed)

## 2011-05-11 NOTE — ED Provider Notes (Signed)
Chief Complaint  Patient presents with  . Shoulder Pain    History of Present Illness:   Lauren Holloway is a 78 year old female who has had a four-day history of pain that began in her right shoulder blade area, radiated to her shoulder, right lateral chest, axilla, and around to the right submammary area. She denies any rash or itching. The pain is described as a severe, sharp ache. She denies any burning sensation of the pain, numbness, or tingling. There has been no injury. Pain is worse with movement of her neck but not with movement of her shoulder arm and not with deep inspiration. She denies any fevers, chills, adenopathy, sore throat, nasal congestion, or rhinorrhea. She's had no radiation of pain down the arm, numbness, tingling, or weakness. She denies any shortness of breath or wheezing. She has had a slight nonproductive cough.  Review of Systems:  Other than noted above, the patient denies any of the following symptoms. Systemic:  No fever, chills, sweats, or fatigue. ENT:  No nasal congestion, rhinorrhea, or sore throat. Pulmonary:  No cough, wheezing, shortness of breath, sputum production, hemoptysis. Cardiac:  No palpitations, rapid heartbeat, dizziness, presyncope or syncope. GI:  No abdominal pain, heartburn, nausea, or vomiting. Skin:  No rash or itching. Ext:  No leg pain or swelling.   PMFSH:  Past medical history, family history, social history, meds, and allergies were reviewed and updated as needed.  Physical Exam:   Vital signs:  BP 150/66  Pulse 69  Temp(Src) 97.3 F (36.3 C) (Oral)  Resp 16  SpO2 99% Gen:  Alert, oriented, in no distress, skin warm and dry. Eye:  PERRL, lids and conjunctivas normal.  Sclera non-icteric. ENT:  Mucous membranes moist, pharynx clear. Neck:  Supple, no adenopathy or tenderness.  No JVD. Lungs:  Clear to auscultation, no wheezes, rales or rhonchi.  No respiratory distress. Heart:  Regular rhythm.  No gallops, murmers, clicks or  rubs. Chest:  No chest wall tenderness. Abdomen:  Soft, nontender, no organomegaly or mass.  Bowel sounds normal.  No pulsatile abdominal mass or bruit. Ext:  No edema.  No calf tenderness and Homann's sign negative.  Pulses full and equal. Skin:  Warm and dry.  No rash.   Radiology:  Dg Chest 2 View  05/11/2011  *RADIOLOGY REPORT*  Clinical Data: Right-sided scapular pain for the past 4 days.  CHEST - 2 VIEW  Comparison: Chest x-ray 02/07/2009.  Findings: Lung volumes are normal.  No consolidative airspace disease.  No pleural effusions.  No pneumothorax.  No pulmonary nodule or mass noted.  Pulmonary vasculature and the cardiomediastinal silhouette are within normal limits.  IMPRESSION: 1. No radiographic evidence of acute cardiopulmonary disease.  Original Report Authenticated By: Florencia Reasons, M.D.    EKG:   Date: 05/11/2011  Rate: 50  Rhythm: sinus bradycardia  QRS Axis: normal  Intervals: normal  ST/T Wave abnormalities: normal  Conduction Disutrbances:none  Narrative Interpretation: Sinus bradycardia, otherwise normal EKG.  Old EKG Reviewed: none available  Course in Urgent Care Center:   She was given hydromorphone 1 mg IM and Zofran 8 mg by mouth sublingually. She tolerated these medications well without any immediate side effects.   Assessment:   Diagnoses that have been ruled out:  None  Diagnoses that are still under consideration:  None  Final diagnoses:  Shingles    Plan:   1.  The following meds were prescribed:   New Prescriptions   ACYCLOVIR (ZOVIRAX) 400 MG  TABLET    Take 2 tablets (800 mg total) by mouth every 4 (four) hours while awake.   OXYCODONE-ACETAMINOPHEN (PERCOCET) 5-325 MG PER TABLET    1 to 2 tablets every 6 hours as needed for pain.   PREDNISONE 5 MG KIT    Take 1 kit (5 mg total) by mouth daily after breakfast. Prednisone 5 mg 6 day dosepack.  Take as directed.   2.  The patient was instructed in symptomatic care and handouts were given. 3.   The patient was told to return if becoming worse in any way, if no better in 3 or 4 days, and given some red flag symptoms that would indicate earlier return.    Reuben Likes, MD 05/11/11 1351

## 2012-01-01 ENCOUNTER — Emergency Department (INDEPENDENT_AMBULATORY_CARE_PROVIDER_SITE_OTHER)
Admission: EM | Admit: 2012-01-01 | Discharge: 2012-01-01 | Disposition: A | Discharge status: Home / Self Care | Payer: Medicare Other | Source: Home / Self Care

## 2012-01-01 ENCOUNTER — Encounter (HOSPITAL_COMMUNITY): Payer: Self-pay | Admitting: *Deleted

## 2012-01-01 DIAGNOSIS — J069 Acute upper respiratory infection, unspecified: Secondary | ICD-10-CM

## 2012-01-01 LAB — POCT RAPID STREP A: Streptococcus, Group A Screen (Direct): NEGATIVE

## 2012-01-01 MED ORDER — PHENYLEPHRINE-CHLORPHEN-DM 10-4-12.5 MG/5ML PO LIQD: 5.0000 mL | ORAL | Status: DC | PRN

## 2012-01-01 NOTE — ED Provider Notes (Signed)
History     CSN: [id number]  Arrival date & time 01/01/12  9528   First MD Initiated Contact with Patient 01/01/12 [phone]      Chief Complaint  Patient presents with  . Sore Throat    (Consider location/radiation/quality/duration/timing/severity/associated sxs/prior treatment) HPI Comments: 78 year old female presents with upper respiratory congestion, laryngitis and cough for 3 days. She denies having p.m. the she is clearing her throat frequently. Denies fever, earache or sore throat. Denies chest pain or shortness of breath.  Patient is a 78 y.o. female presenting with pharyngitis.  Sore Throat    Past Medical History  Diagnosis Date  . Cataract   . Hypertension     Past Surgical History  Procedure Date  . Appendectomy   . Abdominal hysterectomy   . Bilateral oophorectomy     Family History  Problem Relation Age of Onset  . Ovarian cancer Mother   . Hypertension Mother   . Hypertension Father     History  Substance Use Topics  . Smoking status: Never Smoker   . Smokeless tobacco: Not on file  . Alcohol Use: Yes     MIX OCCASIONALLY    OB History    Grav Para Term Preterm Abortions TAB SAB Ect Mult Living                  Review of Systems  Constitutional: Negative for fever, chills, activity change, appetite change and fatigue.  HENT: Positive for congestion and rhinorrhea. Negative for sore throat, facial swelling, neck pain, neck stiffness and postnasal drip.   Eyes: Negative.   Respiratory: Negative.   Cardiovascular: Negative.   Gastrointestinal: Negative.   Genitourinary: Negative.   Musculoskeletal: Negative.   Skin: Negative for pallor and rash.  Neurological: Negative.   Psychiatric/Behavioral: Negative.     Allergies  Sulfonamide derivatives  Home Medications   Current Outpatient Rx  Name Route Sig Dispense Refill  . AMLODIPINE BESYLATE 5 MG PO TABS Oral Take 5 mg by mouth daily.      Marland Kitchen DICYCLOMINE HCL 10 MG PO CAPS Oral Take 1  capsule (10 mg total) by mouth 2 (two) times daily. 120 capsule 0  . ESTROGENS CONJUGATED 1.25 MG PO TABS Oral Take 1.25 mg by mouth daily.      Marland Kitchen LISINOPRIL-HYDROCHLOROTHIAZIDE 20-12.5 MG PO TABS Oral Take 1 tablet by mouth daily.      Marland Kitchen METOPROLOL SUCCINATE ER 25 MG PO TB24 Oral Take 25 mg by mouth daily.      Marland Kitchen PHENYLEPHRINE-CHLORPHEN-DM 12-06-10.5 MG/5ML PO LIQD Oral Take 5 mLs by mouth every 4 (four) hours as needed. 120 mL 0  . POTASSIUM CHLORIDE 10 MEQ PO TBCR Oral Take 10 mEq by mouth daily.      Marland Kitchen PREDNISONE 5 MG PO KIT Oral Take 1 kit (5 mg total) by mouth daily after breakfast. Prednisone 5 mg 6 day dosepack.  Take as directed. 1 kit 0    BP 171/85  Pulse 65  Temp 98.4 F (36.9 C) (Oral)  Resp 17  SpO2 99%  Physical Exam  Constitutional: She is oriented to person, place, and time. She appears well-developed and well-nourished. No distress.  HENT:  Right Ear: External ear normal.  Left Ear: External ear normal.  Nose: Nose normal.  Mouth/Throat: No oropharyngeal exudate.       TMs are partially obstructed by cerumen. Oropharynx is erythematous with cobblestoning and clear PND   Eyes: Conjunctivae normal and EOM are normal.  Neck: Normal  range of motion. Neck supple.  Cardiovascular: Normal rate, regular rhythm and normal heart sounds.   Pulmonary/Chest: Effort normal and breath sounds normal. No respiratory distress. She has no wheezes. She has no rales.  Abdominal: Soft. She exhibits no distension. There is no tenderness.  Musculoskeletal: Normal range of motion. She exhibits no edema.  Lymphadenopathy:    She has no cervical adenopathy.  Neurological: She is alert and oriented to person, place, and time.  Skin: Skin is warm and dry. No rash noted.  Psychiatric: She has a normal mood and affect.    ED Course  Procedures (including critical care time)   Labs Reviewed  POCT RAPID STREP A ([facility] ONLY)   No results found.   1. URI (upper respiratory  infection)       MDM  Norell CS 1 teaspoon every 4 hours when necessary cough drainage and congestion. Drink plenty of fluids stay well hydrated Cepacol lozenges as needed for development of sore throat Tylenol every 4 hours when necessary pain and discomfort [place] of rest        Hayden Rasmussen, NP 01/01/12 1042  Hayden Rasmussen, NP 01/01/12 1042

## 2012-01-01 NOTE — ED Notes (Signed)
Pt  Reports  Symptoms  Of  sorethroat  With  Body  Aches  And  Congestion       Cough  Is  Worse at  Night        -  Symptoms  Not  releived  By  otc   meds

## 2012-01-02 NOTE — ED Provider Notes (Signed)
Medical screening examination/treatment/procedure(s) were performed by non-physician practitioner and as supervising physician I was immediately available for consultation/collaboration.   [facility]; MD   Sharin Grave, MD 01/02/12 [phone]

## 2013-03-31 ENCOUNTER — Ambulatory Visit (INDEPENDENT_AMBULATORY_CARE_PROVIDER_SITE_OTHER): Payer: Medicare Other | Admitting: Podiatry

## 2013-03-31 ENCOUNTER — Ambulatory Visit (INDEPENDENT_AMBULATORY_CARE_PROVIDER_SITE_OTHER): Payer: Medicare Other

## 2013-03-31 ENCOUNTER — Encounter: Payer: Self-pay | Admitting: Podiatry

## 2013-03-31 VITALS — BP 159/76 | HR 74 | Resp 12 | Ht 63.0 in | Wt 150.0 lb

## 2013-03-31 DIAGNOSIS — R52 Pain, unspecified: Secondary | ICD-10-CM

## 2013-03-31 DIAGNOSIS — M779 Enthesopathy, unspecified: Secondary | ICD-10-CM

## 2013-03-31 NOTE — Progress Notes (Signed)
   Subjective:    Patient ID: Lauren Holloway, female    DOB: [birth year], 80 y.o.   MRN: [id number]  HPI Comments: '' BOTH BALL OF THE FEET ARE TENDER FOR 2 WEEKS AND BEEN THE SAME. TIRED NO TREATMENT.''  Foot Pain   Patient denies any direct injury or any significant increased her physical activity. The symptoms are proportions of standing and walking.   Review of Systems  All other systems reviewed and are negative.       Objective:   Physical Exam Orientated x31 space 80 year old female  Vascular: The DP and PT pulses are 2/4 bilaterally. Capillary fill is immediate bilaterally.  Neurological: Knee and ankle reflexes equal and reactive bilaterally  Dermatological: Hypertrophic, deformed, and discolored toenails x10 noted  Musculoskeletal: The medial longitudinal arches bilaterally and a medium/tight contour bilaterally. The heel is in a varus position upon standing bilaterally. There is palpable tenderness on the plantar second and third right metatarsal and the plantar third left metatarsal without a palpable lesions or edema noted. These areas that are symptomatic.  X-ray examination right and left feet demonstrate intact bony structures done a fracture dislocation and adequate joint spaces including the MPJs bilaterally.       Assessment & Plan:   Assessment: Capsulitis/metatarsalgia bilaterally  Plan:Two Universal metatarsal straps  weredispensed. Patient noticed immediate relief when she began wearing these. She will continue to wear these in a thick sole shoes until asymptomatic.

## 2013-03-31 NOTE — Patient Instructions (Signed)
Wear the pads on the right and left feet daily until foot pain ends.

## 2013-10-25 ENCOUNTER — Ambulatory Visit (INDEPENDENT_AMBULATORY_CARE_PROVIDER_SITE_OTHER): Payer: Medicare Other | Admitting: Cardiovascular Disease

## 2013-10-25 ENCOUNTER — Encounter: Payer: Self-pay | Admitting: Cardiovascular Disease

## 2013-10-25 VITALS — BP 122/64 | HR 58 | Ht 64.0 in | Wt 165.0 lb

## 2013-10-25 DIAGNOSIS — I10 Essential (primary) hypertension: Secondary | ICD-10-CM

## 2013-10-25 DIAGNOSIS — R0609 Other forms of dyspnea: Secondary | ICD-10-CM

## 2013-10-25 DIAGNOSIS — R0600 Dyspnea, unspecified: Secondary | ICD-10-CM | POA: Insufficient documentation

## 2013-10-25 DIAGNOSIS — R0989 Other specified symptoms and signs involving the circulatory and respiratory systems: Secondary | ICD-10-CM

## 2013-10-25 NOTE — Assessment & Plan Note (Signed)
Well controlled.  Continue current medications and low sodium Dash type diet.    

## 2013-10-25 NOTE — Patient Instructions (Signed)
Your physician recommends that you schedule a follow-up appointment in:   3 MONTHS WITH  DR NISHAN Your physician recommends that you continue on your current medications as directed. Please refer to the Current Medication list given to you today.  Your physician has requested that you have an echocardiogram. Echocardiography is a painless test that uses sound waves to create images of your heart. It provides your doctor with information about the size and shape of your heart and how well your heart's chambers and valves are working. This procedure takes approximately one hour. There are no restrictions for this procedure.  

## 2013-10-25 NOTE — Assessment & Plan Note (Signed)
Seems more related to "asthma"  Continue Qvar and consider claritin when outdoors.  Will order echo to assess RV and LV function and estimate PA pressure.  No immediate stress test needed as she is active at gym with no chest pain

## 2013-10-25 NOTE — Progress Notes (Signed)
Patient ID: Lauren Holloway, female   DOB: April 01, 1931, 80 y.o.   MRN: [id number]   80 yo  [employer] graduate referred by Dr Joylene Draft for dyspnea Reviewed his office notes from 10/21/13    His notes indicate echo in 2013 with mild pulmonary HTN  Record not available  Some asthma uses ventolin and trial of Qvair  Non smoker  CXR at his office reported normal and review shows normal CXR 2013 She gets her dyspnea always with wheezing.  Mostly when outside and tending her roses.  She goes to gym 2-3 x/ week and does not Have as much dyspnea or wheezing.  No chest pain.  No history of murmur, CAD, anemia or thyroid disease.  She was on ventolin and Now on Qvar and feels it is helping with less wheezing No LE edema or pain    ROS: Denies fever, malais, weight loss, blurry vision, decreased visual acuity, cough, sputum, SOB, hemoptysis, pleuritic pain, palpitaitons, heartburn, abdominal pain, melena, lower extremity edema, claudication, or rash.  All other systems reviewed and negative   General: Affect appropriate Healthy:  appears stated age 73: normal Neck supple with no adenopathy JVP normal no bruits no thyromegaly Lungs clear with no wheezing and good diaphragmatic motion Heart:  S1/S2 no murmur,rub, gallop or click PMI normal Abdomen: benighn, BS positve, no tenderness, no AAA no bruit.  No HSM or HJR Distal pulses intact with no bruits No edema Neuro non-focal Skin warm and dry No muscular weakness  Medications Current Outpatient Prescriptions  Medication Sig Dispense Refill  . amLODipine (NORVASC) 5 MG tablet Take 5 mg by mouth daily.        Marland Kitchen aspirin 81 MG tablet Take 81 mg by mouth daily.      Marland Kitchen escitalopram (LEXAPRO) 5 MG tablet Take 5 mg by mouth daily.      . Fish Oil-Cholecalciferol (FISH OIL + D3 PO) Take by mouth.      Marland Kitchen lisinopril-hydrochlorothiazide (PRINZIDE,ZESTORETIC) 20-12.5 MG per tablet Take 1 tablet by mouth daily.        . meloxicam (MOBIC) 7.5 MG tablet       .  metoprolol succinate (TOPROL-XL) 25 MG 24 hr tablet Take 25 mg by mouth daily.        . metoprolol tartrate (LOPRESSOR) 25 MG tablet        No current facility-administered medications for this visit.    Allergies Sulfonamide derivatives  Family History: Family History  Problem Relation Age of Onset  . Ovarian cancer Mother   . Hypertension Mother   . Hypertension Father     Social History: History   Social History  . Marital Status: Widowed    Spouse Name: N/A    Number of Children: N/A  . Years of Education: N/A   Occupational History  . Not on file.   Social History Main Topics  . Smoking status: Never Smoker   . Smokeless tobacco: Not on file  . Alcohol Use: Yes     Comment: MIX OCCASIONALLY  . Drug Use: No  . Sexual Activity: Not on file   Other Topics Concern  . Not on file   Social History Narrative  . No narrative on file    Electrocardiogram:  05/11/11 SB rate 58  Nonspecific ST T wave changes   Assessment and Plan

## 2013-10-29 ENCOUNTER — Ambulatory Visit (HOSPITAL_COMMUNITY): Payer: Medicare Other | Attending: Cardiovascular Disease | Admitting: Radiology

## 2013-10-29 DIAGNOSIS — I10 Essential (primary) hypertension: Secondary | ICD-10-CM | POA: Insufficient documentation

## 2013-10-29 DIAGNOSIS — R0602 Shortness of breath: Secondary | ICD-10-CM

## 2013-10-29 DIAGNOSIS — R0989 Other specified symptoms and signs involving the circulatory and respiratory systems: Secondary | ICD-10-CM | POA: Diagnosis present

## 2013-10-29 DIAGNOSIS — R0600 Dyspnea, unspecified: Secondary | ICD-10-CM

## 2013-10-29 DIAGNOSIS — R0609 Other forms of dyspnea: Secondary | ICD-10-CM | POA: Insufficient documentation

## 2013-10-29 NOTE — Progress Notes (Signed)
Echocardiogram performed.  

## 2013-11-04 ENCOUNTER — Telehealth: Payer: Self-pay | Admitting: Cardiovascular Disease

## 2013-11-04 NOTE — Telephone Encounter (Signed)
PT AWARE OF ECHO RESULTS./CY 

## 2013-11-04 NOTE — Telephone Encounter (Signed)
New Message  Pt called for ECHO results please call

## 2013-12-03 ENCOUNTER — Encounter: Payer: Self-pay | Admitting: Internal Medicine

## 2014-01-25 ENCOUNTER — Ambulatory Visit (INDEPENDENT_AMBULATORY_CARE_PROVIDER_SITE_OTHER): Payer: Medicare Other | Admitting: Cardiovascular Disease

## 2014-01-25 ENCOUNTER — Encounter: Payer: Self-pay | Admitting: Cardiovascular Disease

## 2014-01-25 VITALS — BP 136/68 | HR 56 | Ht 65.0 in | Wt 165.8 lb

## 2014-01-25 DIAGNOSIS — R0600 Dyspnea, unspecified: Secondary | ICD-10-CM

## 2014-01-25 DIAGNOSIS — I10 Essential (primary) hypertension: Secondary | ICD-10-CM

## 2014-01-25 NOTE — Assessment & Plan Note (Signed)
Well controlled.  Continue current medications and low sodium Dash type diet.    

## 2014-01-25 NOTE — Assessment & Plan Note (Signed)
Not related to heart  Echo normal RV and LV function ? Reactive airway disease  Sounds good today and very active for age No further w/u at this time F/U 6 months

## 2014-01-25 NOTE — Patient Instructions (Signed)
Your physician wants you to follow-up in:  6 MONTHS WITH DR NISHAN  You will receive a reminder letter in the mail two months in advance. If you don't receive a letter, please call our office to schedule the follow-up appointment. Your physician recommends that you continue on your current medications as directed. Please refer to the Current Medication list given to you today. 

## 2014-01-25 NOTE — Progress Notes (Signed)
Patient ID: Lauren Holloway, female   DOB: 04-11-1931, 80 y.o.   MRN: [id number] 80 yo [employer] graduate referred by Dr Joylene Draft for dyspnea Reviewed his office notes from 10/21/13    His notes indicate echo in 2013 with mild pulmonary HTN Record not available  Some asthma uses ventolin and trial of Qvair Non smoker CXR at his office reported normal and review shows normal CXR 2013 She gets her dyspnea always with wheezing. Mostly when outside and tending her roses. She goes to gym 2-3 x/ week and does not Have as much dyspnea or wheezing. No chest pain. No history of murmur, CAD, anemia or thyroid disease. She was on ventolin and Now on Qvar and feels it is helping with less wheezing No LE edema or pain   Echo:  8/28  EF 60-65%  PA syst estimate 38 mmhg   Breathing some better Still complaining about not finding a boyfriend    ROS: Denies fever, malais, weight loss, blurry vision, decreased visual acuity, cough, sputum, SOB, hemoptysis, pleuritic pain, palpitaitons, heartburn, abdominal pain, melena, lower extremity edema, claudication, or rash.  All other systems reviewed and negative  General: Affect appropriate Healthy:  appears stated age 7: normal Neck supple with no adenopathy JVP normal no bruits no thyromegaly Lungs clear with no wheezing and good diaphragmatic motion Heart:  S1/S2 no murmur, no rub, gallop or click PMI normal Abdomen: benighn, BS positve, no tenderness, no AAA no bruit.  No HSM or HJR Distal pulses intact with no bruits No edema Neuro non-focal Skin warm and dry No muscular weakness   Current Outpatient Prescriptions  Medication Sig Dispense Refill  . amLODipine (NORVASC) 5 MG tablet Take 5 mg by mouth daily.      Marland Kitchen aspirin 81 MG tablet Take 81 mg by mouth daily.    Marland Kitchen doxazosin (CARDURA) 2 MG tablet Take 2 mg by mouth daily.    Marland Kitchen escitalopram (LEXAPRO) 5 MG tablet Take 5 mg by mouth daily.    . Fish Oil-Cholecalciferol (FISH OIL + D3 PO) Take  by mouth.    Marland Kitchen HYDROcodone-acetaminophen (NORCO/VICODIN) 5-325 MG per tablet Take 1 tablet by mouth every 6 (six) hours as needed.  0  . lisinopril-hydrochlorothiazide (PRINZIDE,ZESTORETIC) 20-12.5 MG per tablet Take 1 tablet by mouth daily.      . metoprolol tartrate (LOPRESSOR) 25 MG tablet     . pravastatin (PRAVACHOL) 40 MG tablet Take 40 mg by mouth at bedtime.    . meloxicam (MOBIC) 7.5 MG tablet     . metoprolol succinate (TOPROL-XL) 25 MG 24 hr tablet Take 25 mg by mouth daily.      . predniSONE (DELTASONE) 10 MG tablet Take 10 mg by mouth daily with breakfast.     No current facility-administered medications for this visit.    Allergies  Sulfonamide derivatives  Electrocardiogram:  SR rate 58 nonspecific ST T wave change 8/15   Assessment and Plan

## 2014-06-06 ENCOUNTER — Other Ambulatory Visit: Payer: Self-pay | Admitting: Obstetrics & Gynecology

## 2014-06-08 LAB — CYTOLOGY - PAP

## 2014-07-21 NOTE — Progress Notes (Signed)
Patient ID: Lauren Holloway, female   DOB: 1931-03-21, 81 y.o.   MRN: [id number] 81 y.o.  [employer] graduate referred by Dr Joylene Draft for dyspnea Reviewed his office notes from 10/21/13    His notes indicate echo in 2013 with mild pulmonary HTN Record not available  Some asthma uses ventolin and trial of Qvair Non smoker CXR at his office reported normal and review shows normal CXR 2013 She gets her dyspnea always with wheezing. Mostly when outside and tending her roses. She goes to gym 2-3 x/ week and does not Have as much dyspnea or wheezing. No chest pain. No history of murmur, CAD, anemia or thyroid disease. She was on ventolin and Now on Qvar and feels it is helping with less wheezing No LE edema or pain   Echo:  8/28  EF 60-65%  PA syst estimate 38 mmhg   Breathing some better Still complaining about not finding a boyfriend  Been on a cruise and church and still can't find one    ROS: Denies fever, malais, weight loss, blurry vision, decreased visual acuity, cough, sputum, SOB, hemoptysis, pleuritic pain, palpitaitons, heartburn, abdominal pain, melena, lower extremity edema, claudication, or rash.  All other systems reviewed and negative  General: Affect appropriate Healthy:  appears stated age 18: normal Neck supple with no adenopathy JVP normal no bruits no thyromegaly Lungs clear with no wheezing and good diaphragmatic motion Heart:  S1/S2 no murmur, no rub, gallop or click PMI normal Abdomen: benighn, BS positve, no tenderness, no AAA no bruit.  No HSM or HJR Distal pulses intact with no bruits No edema Neuro non-focal Skin warm and dry No muscular weakness   Current Outpatient Prescriptions  Medication Sig Dispense Refill  . amLODipine (NORVASC) 5 MG tablet Take 5 mg by mouth daily.      Marland Kitchen aspirin 81 MG tablet Take 81 mg by mouth daily.    Marland Kitchen doxazosin (CARDURA) 2 MG tablet Take 2 mg by mouth daily.    Marland Kitchen escitalopram (LEXAPRO) 10 MG tablet Take 1 tablet by mouth  daily.    . Fish Oil-Cholecalciferol (FISH OIL + D3 PO) Take 1 tablet by mouth daily.     Marland Kitchen lisinopril-hydrochlorothiazide (PRINZIDE,ZESTORETIC) 20-25 MG per tablet Take 1 tablet by mouth daily.    . metoprolol tartrate (LOPRESSOR) 25 MG tablet Take 25 mg by mouth 2 (two) times daily.     . VENTOLIN HFA 108 (90 BASE) MCG/ACT inhaler Inhale 1 puff into the lungs 2 (two) times daily.     No current facility-administered medications for this visit.    Allergies  Sulfonamide derivatives  Electrocardiogram:   01/25/14  SR rate 58 nonspecific ST T wave change 8/15   Assessment and Plan Dyspnea:  Non cardiac exam normal echo normal stable HTN:  Well controlled.  Continue current medications and low sodium Dash type diet.     F/u with Dr Abner Greenspan in September and me in a year

## 2014-07-25 ENCOUNTER — Encounter: Payer: Self-pay | Admitting: Cardiovascular Disease

## 2014-07-25 ENCOUNTER — Ambulatory Visit (INDEPENDENT_AMBULATORY_CARE_PROVIDER_SITE_OTHER): Payer: Medicare Other | Admitting: Cardiovascular Disease

## 2014-07-25 VITALS — BP 138/54 | HR 61 | Ht 64.0 in | Wt 160.1 lb

## 2014-07-25 DIAGNOSIS — R0600 Dyspnea, unspecified: Secondary | ICD-10-CM

## 2014-07-25 NOTE — Patient Instructions (Addendum)
Medication Instructions:  NO CHANGES  Labwork: NONE  Testing/Procedures: NONE  Follow-Up: Your physician wants you to follow-up in:   12 MONTHS WITH DR NISHAN You will receive a reminder letter in the mail two months in advance. If you don't receive a letter, please call our office to schedule the follow-up appointment.  Any Other Special Instructions Will Be Listed Below (If Applicable).   

## 2015-03-25 ENCOUNTER — Encounter (HOSPITAL_COMMUNITY): Payer: Self-pay | Admitting: Emergency Medicine

## 2015-03-25 ENCOUNTER — Emergency Department (HOSPITAL_COMMUNITY): Payer: Medicare Other

## 2015-03-25 ENCOUNTER — Emergency Department (HOSPITAL_COMMUNITY)
Admission: EM | Admit: 2015-03-25 | Discharge: 2015-03-25 | Disposition: A | Discharge status: Home / Self Care | Payer: Medicare Other | Attending: Emergency Medicine | Admitting: Emergency Medicine

## 2015-03-25 DIAGNOSIS — Y9389 Activity, other specified: Secondary | ICD-10-CM | POA: Diagnosis not present

## 2015-03-25 DIAGNOSIS — Y998 Other external cause status: Secondary | ICD-10-CM | POA: Insufficient documentation

## 2015-03-25 DIAGNOSIS — S99921A Unspecified injury of right foot, initial encounter: Secondary | ICD-10-CM | POA: Diagnosis present

## 2015-03-25 DIAGNOSIS — Y9289 Other specified places as the place of occurrence of the external cause: Secondary | ICD-10-CM | POA: Insufficient documentation

## 2015-03-25 DIAGNOSIS — W228XXA Striking against or struck by other objects, initial encounter: Secondary | ICD-10-CM | POA: Diagnosis not present

## 2015-03-25 DIAGNOSIS — S90121A Contusion of right lesser toe(s) without damage to nail, initial encounter: Secondary | ICD-10-CM | POA: Insufficient documentation

## 2015-03-25 DIAGNOSIS — Z79899 Other long term (current) drug therapy: Secondary | ICD-10-CM | POA: Diagnosis not present

## 2015-03-25 DIAGNOSIS — H269 Unspecified cataract: Secondary | ICD-10-CM | POA: Diagnosis not present

## 2015-03-25 DIAGNOSIS — I10 Essential (primary) hypertension: Secondary | ICD-10-CM | POA: Insufficient documentation

## 2015-03-25 DIAGNOSIS — Z7982 Long term (current) use of aspirin: Secondary | ICD-10-CM | POA: Insufficient documentation

## 2015-03-25 MED ORDER — IBUPROFEN 200 MG PO TABS
600.0000 mg | ORAL_TABLET | Freq: Once | ORAL | Status: AC
Start: 2015-03-25 — End: 2015-03-25
  Administered 2015-03-25: 600 mg via ORAL
  Filled 2015-03-25: qty 1

## 2015-03-25 MED ORDER — IBUPROFEN 400 MG PO TABS: 400.0000 mg | ORAL_TABLET | Freq: Four times a day (QID) | ORAL | Status: DC | PRN

## 2015-03-25 NOTE — ED Notes (Signed)
Pt sts right pinky toe pain after hitting on grocery cart yesterday

## 2015-03-25 NOTE — ED Provider Notes (Signed)
CSN: [id number]     Arrival date & time 03/25/15  1018 History   First MD Initiated Contact with Patient 03/25/15 1114     Chief Complaint  Patient presents with  . Toe Pain   HPI   Lauren Holloway is an 82 y.o. female who presents to the ED for evaluation of right fifth toe pain. States she stubbed her toe on a grocery shopping cart yesterday. Since then her toe has been extremely painful. She states she has used iced to the area and epsom salt soaks with no relief. She denies radiation of the pain. Denies new weakness, numbness, or tingling. Denies significant swelling or erythema. States she has not tried any PO pain meds. Denies falling at time of injury. States she is able to ambulate though it is painful.  Past Medical History  Diagnosis Date  . Cataract   . Hypertension    Past Surgical History  Procedure Laterality Date  . Appendectomy    . Abdominal hysterectomy    . Bilateral oophorectomy     Family History  Problem Relation Age of Onset  . Ovarian cancer Mother   . Hypertension Mother   . Hypertension Father    Social History  Substance Use Topics  . Smoking status: Never Smoker   . Smokeless tobacco: None  . Alcohol Use: Yes     Comment: MIX OCCASIONALLY   OB History    No data available     Review of Systems  All other systems reviewed and are negative.     Allergies  Sulfonamide derivatives  Home Medications   Prior to Admission medications   Medication Sig Start Date End Date Taking? Authorizing Provider  amLODipine (NORVASC) 5 MG tablet Take 5 mg by mouth daily.      Historical Provider, MD  aspirin 81 MG tablet Take 81 mg by mouth daily.    Historical Provider, MD  doxazosin (CARDURA) 2 MG tablet Take 2 mg by mouth daily. 09/27/13   Historical Provider, MD  escitalopram (LEXAPRO) 10 MG tablet Take 1 tablet by mouth daily. 05/03/14   Historical Provider, MD  Fish Oil-Cholecalciferol (FISH OIL + D3 PO) Take 1 tablet by mouth daily.     Historical  Provider, MD  lisinopril-hydrochlorothiazide (PRINZIDE,ZESTORETIC) 20-25 MG per tablet Take 1 tablet by mouth daily. 05/03/14   Historical Provider, MD  metoprolol tartrate (LOPRESSOR) 25 MG tablet Take 25 mg by mouth 2 (two) times daily.  03/18/13   Historical Provider, MD  VENTOLIN HFA 108 (90 BASE) MCG/ACT inhaler Inhale 1 puff into the lungs 2 (two) times daily. 04/19/14   Historical Provider, MD   BP 144/55 mmHg  Pulse 64  Temp(Src) 97.7 F (36.5 C) (Oral)  Resp 18  SpO2 95% Physical Exam  Constitutional: She is oriented to person, place, and time. No distress.  HENT:  Head: Atraumatic.  Right Ear: External ear normal.  Left Ear: External ear normal.  Nose: Nose normal.  Mouth/Throat: Oropharynx is clear and moist.  Eyes: Conjunctivae and EOM are normal.  Neck: Normal range of motion. Neck supple.  Cardiovascular: Normal rate, regular rhythm, normal heart sounds and intact distal pulses.   Pulmonary/Chest: Effort normal and breath sounds normal. She exhibits no tenderness.  Abdominal: Soft. She exhibits no distension. There is no tenderness.  Musculoskeletal: Normal range of motion.  Right pinky toe mildly erythematous. +TTP. No radiation of pain. 2+ pulses. Good cap refill x 5 digits.   Neurological: She is alert and oriented  to person, place, and time. No cranial nerve deficit. Gait normal.  Skin: Skin is warm and dry. She is not diaphoretic.  Psychiatric: She has a normal mood and affect.  Nursing note and vitals reviewed.   ED Course  Procedures (including critical care time) Labs Review Labs Reviewed - No data to display  Imaging Review Dg Foot Complete Right  03/25/2015  CLINICAL DATA:  Blunt trauma on grocery cart with lateral foot pain at the fifth toe, initial encounter EXAM: RIGHT FOOT COMPLETE - 3+ VIEW COMPARISON:  None. FINDINGS: There is no evidence of fracture or dislocation. There is no evidence of arthropathy or other focal bone abnormality. Soft tissues are  unremarkable. IMPRESSION: No acute abnormality noted. Electronically Signed   By: Inez Catalina M.D.   On: 03/25/2015 11:25   I have personally reviewed and evaluated these images and lab results as part of my medical decision-making.   EKG Interpretation None      MDM   Final diagnoses:  Contusion of fifth toe, right, initial encounter    XR negative. Motrin given for pain. Will buddy tape toe for support. Discussed RICE therapy. Encouraged PCP f/u. ER return precautions given.       Anne Ng, PA-C 03/25/15 [place], MD 03/28/15 [phone]

## 2015-03-25 NOTE — Discharge Instructions (Signed)
Your X-ray was normal today. As we discussed, you may take Motrin as needed for pain. Apply ice to the area on and off to help with pain and swelling. We taped your small toe to the one next to it for added support. Please follow up with your primary care provider next week. Return to the ED for new or worsening symptoms.

## 2015-03-31 ENCOUNTER — Encounter: Payer: Self-pay | Admitting: Podiatry

## 2015-03-31 ENCOUNTER — Ambulatory Visit (INDEPENDENT_AMBULATORY_CARE_PROVIDER_SITE_OTHER): Payer: Medicare Other | Admitting: Podiatry

## 2015-03-31 VITALS — BP 126/61 | HR 53 | Resp 16

## 2015-03-31 DIAGNOSIS — S92911A Unspecified fracture of right toe(s), initial encounter for closed fracture: Secondary | ICD-10-CM | POA: Diagnosis not present

## 2015-03-31 NOTE — Progress Notes (Signed)
Subjective:     Patient ID: Lauren Holloway, female   DOB: [birth year], 82 y.o.   MRN: [id number]  HPI this patient presents to the office with chief complaint of a painful fifth toe, right foot. She states she jammed her fifth toe while shopping last Friday. She says on Saturday. She went to the emergency room where they diagnosed her as having a contusion of the fifth toe, right foot. They told her to make an appointment and follow-up with a podiatrist. She presents the office today stating that her toe has continued to swell and be painful and throbbing at night. She presents the office for an evaluation and treatment of her fifth toe   Review of Systems     Objective:   Physical Exam GENERAL APPEARANCE: Alert, conversant. Appropriately groomed. No acute distress.  VASCULAR: Pedal pulses palpable at  [facility] and PT bilateral.  Capillary refill time is immediate to all digits,  Normal temperature gradient.  Digital hair growth is present bilateral  NEUROLOGIC: sensation is normal to 5.07 monofilament at 5/5 sites bilateral.  Light touch is intact bilateral, Muscle strength normal.  MUSCULOSKELETAL: acceptable muscle strength, tone and stability bilateral.  Intrinsic muscluature intact bilateral.  Rectus appearance of foot and digits noted bilateral.  Swelling noted fifth toe right foot with palpable pain proximal phalanx fifth toe right foot.  DERMATOLOGIC: skin color, texture, and turgor are within normal limits.  No preulcerative lesions or ulcers  are seen, no interdigital maceration noted.  No open lesions present.  Digital nails are asymptomatic. No drainage noted.      Assessment:     Closed fifth toe fracture right foot.     Plan:     ROV  X-ray reveals a non -displaced fracture.    Patient was taught to buddy tape toes.  Dispense wooden shoe.  RTC prn   Gardiner Barefoot DPM

## 2015-04-06 ENCOUNTER — Telehealth: Payer: Self-pay | Admitting: *Deleted

## 2015-04-06 NOTE — Telephone Encounter (Signed)
Pt states her toe is still red and swollen from her last visit with Dr. Prudence Davidson.  I told pt to begin warm salt water soaks, and transferred to schedulers to be seen tomorrow by Dr. Prudence Davidson.

## 2015-04-07 ENCOUNTER — Ambulatory Visit (INDEPENDENT_AMBULATORY_CARE_PROVIDER_SITE_OTHER): Payer: Medicare Other | Admitting: Podiatry

## 2015-04-07 ENCOUNTER — Encounter: Payer: Self-pay | Admitting: Podiatry

## 2015-04-07 VITALS — BP 167/87 | HR 60 | Resp 14

## 2015-04-07 DIAGNOSIS — S92911A Unspecified fracture of right toe(s), initial encounter for closed fracture: Secondary | ICD-10-CM | POA: Diagnosis not present

## 2015-04-07 MED ORDER — MELOXICAM 15 MG PO TABS: 15.0000 mg | ORAL_TABLET | Freq: Every day | ORAL | Status: DC

## 2015-04-07 NOTE — Progress Notes (Signed)
Subjective:     Patient ID: Lauren Holloway, female   DOB: 1931/07/30, 82 y.o.   MRN: [id number]  HPI this patient presents to the office stating she has continued pain in her fifth toe, right foot. She states the throbbing  pain is worse at night after days activity. She walks in the wooden shoe dispensed last visit.  She says she may be incorrectly buddy taping the toes right foot. She presents for evaluation and treatment. She was diagnosed with closed proximal phalanx last week.   Review of Systems     Objective:   Physical Exam  Objective:   Physical Exam GENERAL APPEARANCE: Alert, conversant. Appropriately groomed. No acute distress.  VASCULAR: Pedal pulses palpable at [facility] and PT bilateral. Capillary refill time is immediate to all digits, Normal temperature gradient. Digital hair growth is present bilateral  NEUROLOGIC: sensation is normal to 5.07 monofilament at 5/5 sites bilateral. Light touch is intact bilateral, Muscle strength normal.  MUSCULOSKELETAL: acceptable muscle strength, tone and stability bilateral. Intrinsic muscluature intact bilateral. Rectus appearance of foot and digits noted bilateral. Swelling noted fifth toe right foot with palpable pain proximal phalanx fifth toe right foot.  DERMATOLOGIC: skin color, texture, and turgor are within normal limits. No preulcerative lesions or ulcers are seen, no interdigital maceration noted. No open lesions present. Digital nails are asymptomatic. No drainage noted.            Assessment:     Closed fracture fifth toe right foot.     Plan:     ROV.  Told patient that the closed fracture takes 4-6 weeks to heal. Therefore, she needs to continue buddy taping the toe and wearing the surgical shoe. Prescribed Mobic to help her with her night pain .  Return to the office in 4 weeks  For reevaluation.  Gardiner Barefoot DPM

## 2015-05-03 ENCOUNTER — Other Ambulatory Visit: Payer: Self-pay | Admitting: Podiatry

## 2015-05-03 NOTE — Telephone Encounter (Signed)
Pt needs an appt prior to future refills. 

## 2015-07-24 NOTE — Progress Notes (Signed)
Patient ID: Lauren Holloway, female   DOB: [birth year], 82 y.o.   MRN: [id number]   82 y.o.  [employer] graduate  Initially referred by Dr Lauren Holloway in 2015  for dyspnea     His notes indicate echo in 2013 with mild pulmonary HTN Record not available  Some asthma uses ventolin and trial of Qvair Non smoker CXR at his office reported normal and review shows normal CXR 2013 She gets her dyspnea always with wheezing. Mostly when outside and tending her roses. She goes to gym 2-3 x/ week and does not Have as much dyspnea or wheezing. No chest pain. No history of murmur, CAD, anemia or thyroid disease. She was on ventolin and Now on Qvar and feels it is helping with less wheezing No LE edema or pain   Echo:  10/29/13   EF 60-65%  PA syst estimate 38 mmhg   Breathing some better Still complaining about not finding a boyfriend  Been on a cruise and church and still can't find one   Her twin brother died last year and left a void  ECG shows new BBB she is asymptomatic    ROS: Denies fever, malais, weight loss, blurry vision, decreased visual acuity, cough, sputum, SOB, hemoptysis, pleuritic pain, palpitaitons, heartburn, abdominal pain, melena, lower extremity edema, claudication, or rash.  All other systems reviewed and negative  General: Affect appropriate Healthy:  appears stated age 82: normal Neck supple with no adenopathy JVP normal no bruits no thyromegaly Lungs clear with no wheezing and good diaphragmatic motion Heart:  S1/S2 no murmur, no rub, gallop or click PMI normal Abdomen: benighn, BS positve, no tenderness, no AAA no bruit.  No HSM or HJR Distal pulses intact with no bruits No edema Neuro non-focal Skin warm and dry No muscular weakness   Current Outpatient Prescriptions  Medication Sig Dispense Refill  . amLODipine (NORVASC) 5 MG tablet Take 5 mg by mouth daily.      Marland Kitchen aspirin 81 MG tablet Take 81 mg by mouth daily.    Marland Kitchen doxazosin (CARDURA) 2 MG tablet Take 2 mg  by mouth daily.    Marland Kitchen escitalopram (LEXAPRO) 10 MG tablet Take 1 tablet by mouth daily.    . Fish Oil-Cholecalciferol (FISH OIL + D3 PO) Take 1 tablet by mouth daily.     Marland Kitchen ibuprofen (ADVIL,MOTRIN) 400 MG tablet Take 400 mg by mouth every 6 (six) hours as needed for headache, mild pain or moderate pain.    Marland Kitchen lisinopril-hydrochlorothiazide (PRINZIDE,ZESTORETIC) 20-25 MG per tablet Take 1 tablet by mouth daily.    . meloxicam (MOBIC) 15 MG tablet TAKE 1 TABLET (15 MG TOTAL) BY MOUTH DAILY. 30 tablet 0  . metoprolol tartrate (LOPRESSOR) 25 MG tablet Take 25 mg by mouth 2 (two) times daily.     . Multiple Vitamins-Minerals (MULTIVITAMIN WITH MINERALS) tablet Take 1 tablet by mouth daily.    . rosuvastatin (CRESTOR) 5 MG tablet Take 5 mg by mouth daily.    . VENTOLIN HFA 108 (90 BASE) MCG/ACT inhaler Inhale 1 puff into the lungs 2 (two) times daily.     No current facility-administered medications for this visit.    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  Continue current medications and low sodium Dash type diet.  Bifasicular Block:  New no high grade AV block no syncope follow ECG q 6 months    F/u with  me in a year  She will get routine blood work with Dr Lauren Holloway next week   Lauren Holloway

## 2015-07-26 ENCOUNTER — Ambulatory Visit (INDEPENDENT_AMBULATORY_CARE_PROVIDER_SITE_OTHER): Payer: Medicare Other | Admitting: Cardiovascular Disease

## 2015-07-26 ENCOUNTER — Encounter: Payer: Self-pay | Admitting: Cardiovascular Disease

## 2015-07-26 VITALS — BP 144/60 | HR 61 | Ht 65.0 in | Wt 164.8 lb

## 2015-07-26 DIAGNOSIS — I10 Essential (primary) hypertension: Secondary | ICD-10-CM | POA: Diagnosis not present

## 2015-07-26 NOTE — Patient Instructions (Signed)

## 2016-02-07 ENCOUNTER — Telehealth: Payer: Self-pay | Admitting: Internal Medicine

## 2016-02-07 ENCOUNTER — Encounter: Payer: Self-pay | Admitting: Internal Medicine

## 2016-02-07 NOTE — Telephone Encounter (Signed)
Pt scheduled to see Tye Savoy NP tomorrow at 3pm. Dr. Joylene Draft aware and will notify pt of appt date and time.

## 2016-02-08 ENCOUNTER — Ambulatory Visit (INDEPENDENT_AMBULATORY_CARE_PROVIDER_SITE_OTHER): Payer: Medicare Other | Admitting: Nurse Practitioner

## 2016-02-08 ENCOUNTER — Encounter: Payer: Self-pay | Admitting: Nurse Practitioner

## 2016-02-08 VITALS — BP 140/60 | HR 66 | Ht 63.0 in | Wt 165.2 lb

## 2016-02-08 DIAGNOSIS — K921 Melena: Secondary | ICD-10-CM | POA: Diagnosis not present

## 2016-02-08 NOTE — Patient Instructions (Addendum)
We have given you samples of Omeprazole 20 mg daily  You have been scheduled for an endoscopy. Please follow written instructions given to you at your visit today. If you use inhalers (even only as needed), please bring them with you on the day of your procedure. Your physician has requested that you go to www.startemmi.com and enter the access code given to you at your visit today. This web site gives a general overview about your procedure. However, you should still follow specific instructions given to you by our office regarding your preparation for the procedure.

## 2016-02-08 NOTE — Progress Notes (Signed)
HPI: Patient is an 82 year old female previously followed by Dr. Olevia Perches. She had an adenomatous colon polyp in 2001. Her last colonoscopy was March 2012. It was a complete exam with a good prep. A tiny non-adenomatous polyp was removed.  Patient is referred by PCP, Dr. [name], for evaluation of melena. Last week she noticed black stools. This continued over the weekend, she saw PCP on Thursday and found to be heme positive. Stools still dark yesterday, normal in color today. No bismuth or iron. Initially patient had some associated morningtime nausea but this has subsided..  No upper abdominal pain. She takes baby aspirin, no other NSAIDs. Weight is stable. Patient has no history of peptic ulcer disease.   Past Medical History:  Diagnosis Date  . Hypertension     Past Surgical History:  Procedure Laterality Date  . ABDOMINAL HYSTERECTOMY    . APPENDECTOMY    . BILATERAL OOPHORECTOMY    . CATARACT EXTRACTION, BILATERAL Bilateral 1996   Family History  Problem Relation Age of Onset  . Ovarian cancer Mother   . Hypertension Mother   . Hypertension Father   . Heart disease Father   . Hypertension Sister   . Hypertension Sister    Social History  Substance Use Topics  . Smoking status: Never Smoker  . Smokeless tobacco: Never Used  . Alcohol use Yes     Comment: MIX OCCASIONALLY   Current Outpatient Prescriptions  Medication Sig Dispense Refill  . amLODipine (NORVASC) 5 MG tablet Take 5 mg by mouth daily.      Marland Kitchen aspirin 81 MG tablet Take 81 mg by mouth daily.    Marland Kitchen doxazosin (CARDURA) 2 MG tablet Take 2 mg by mouth daily.    Marland Kitchen escitalopram (LEXAPRO) 5 MG tablet Take 5 mg by mouth daily.    . Fish Oil-Cholecalciferol (FISH OIL + D3 PO) Take 1 tablet by mouth daily.     Marland Kitchen ibuprofen (ADVIL,MOTRIN) 400 MG tablet Take 400 mg by mouth every 6 (six) hours as needed for headache, mild pain or moderate pain.    Marland Kitchen lisinopril-hydrochlorothiazide (PRINZIDE,ZESTORETIC) 20-25 MG  per tablet Take 1 tablet by mouth daily.    . metoprolol tartrate (LOPRESSOR) 25 MG tablet Take 25 mg by mouth 2 (two) times daily.     . Multiple Vitamins-Minerals (MULTIVITAMIN WITH MINERALS) tablet Take 1 tablet by mouth daily.    . rosuvastatin (CRESTOR) 5 MG tablet Take 5 mg by mouth daily.    . VENTOLIN HFA 108 (90 BASE) MCG/ACT inhaler Inhale 1 puff into the lungs 2 (two) times daily.     No current facility-administered medications for this visit.    Allergies  Allergen Reactions  . Sulfonamide Derivatives     HIVES     Review of Systems: All systems reviewed and negative except where noted in HPI.    Physical Exam: BP 140/60   Pulse 66   Ht 5\' 3"  (1.6 m) Comment: measured without shoes  Wt 165 lb 4 oz (75 kg)   BMI 29.27 kg/m  Constitutional:  Well-developed, black female in no acute distress. Psychiatric: Normal mood and affect. Behavior is normal. HEENT: Normocephalic and atraumatic. Conjunctivae are normal. No scleral icterus. Neck supple.  Cardiovascular: Normal rate, regular rhythm.  Pulmonary/chest: Effort normal and breath sounds normal. No wheezing, rales or rhonchi. Abdominal: Soft, nondistended, nontender. Bowel sounds active throughout. There are no masses palpable. No hepatomegaly. Rectal: brown, heme negative stool Extremities: no edema Lymphadenopathy:  No cervical adenopathy noted. Neurological: Alert and oriented to person place and time. Skin: Skin is warm and dry. No rashes noted.   ASSESSMENT AND PLAN:  82 year old female referred for black, heme positive stool. Today her stools are light brown, heme negative. Hgb yesterday at PCPs office was 13.6 which is at baseline.  -No evidence for significant GI bleeding at present but will schedule for EGD since lesions can bleed intermittently. The risks and benefits of EGD were discussed and the patient agrees to proceed.  -She will come in for repeat CBC on Monday  -Samples of Prilosec OTC given for  her to take once every morning until EGD. -PCP has already put her daily aspirin on hold.    Dr. [place] Cellar will assume patient's GI care now that Dr. Olevia Perches has retired.   Tye Savoy  02/08/2016, 3:19 PM  Cc:  [name], MD

## 2016-02-09 ENCOUNTER — Encounter: Payer: Self-pay | Admitting: Gastroenterology

## 2016-02-12 NOTE — Progress Notes (Signed)
Agree with assessment and plan as outlined. Recent Hgb normal, agree with repeat to ensure stable. We will await EGD.

## 2016-02-14 ENCOUNTER — Ambulatory Visit (INDEPENDENT_AMBULATORY_CARE_PROVIDER_SITE_OTHER): Payer: Medicare Other

## 2016-02-14 ENCOUNTER — Ambulatory Visit (HOSPITAL_COMMUNITY)
Admission: EM | Admit: 2016-02-14 | Discharge: 2016-02-14 | Disposition: A | Discharge status: Home / Self Care | Payer: Medicare Other | Attending: Family Medicine | Admitting: Family Medicine

## 2016-02-14 ENCOUNTER — Encounter (HOSPITAL_COMMUNITY): Payer: Self-pay | Admitting: Emergency Medicine

## 2016-02-14 DIAGNOSIS — S93402A Sprain of unspecified ligament of left ankle, initial encounter: Secondary | ICD-10-CM

## 2016-02-14 MED ORDER — TRAMADOL HCL 50 MG PO TABS: 50.0000 mg | ORAL_TABLET | Freq: Four times a day (QID) | ORAL | 0 refills | Status: DC | PRN

## 2016-02-14 NOTE — ED Triage Notes (Signed)
The patient presented to the [facility] with a complaint of left ankle pain secondary to a fall that occurred today. The patient reported that she slipped in her garage and injured her left ankle. The patient had obvious swelling and reduced ROM in her left foot.

## 2016-02-14 NOTE — ED Provider Notes (Signed)
[place]    CSN: [id number] Arrival date & time: 02/14/16  1556     History   Chief Complaint Chief Complaint  Patient presents with  . Fall    HPI Lauren Holloway is a 82 y.o. female.   This is an 82 year old woman who tripped in her garage today and injured her left ankle. She has pain on the lateral malleolus and cannot bear weight. She has swelling over the lateral aspect of her ankle as well as the anterior aspect right ankle.   Patient has a past medical history of hypertension.      Past Medical History:  Diagnosis Date  . Hypertension     Patient Active Problem List   Diagnosis Date Noted  . Dyspnea 10/25/2013  . Essential hypertension 05/18/2010  . ABDOMINAL PAIN RIGHT LOWER QUADRANT 05/18/2010  . PERSONAL HISTORY OF COLONIC POLYPS 05/18/2010    Past Surgical History:  Procedure Laterality Date  . ABDOMINAL HYSTERECTOMY    . APPENDECTOMY    . BILATERAL OOPHORECTOMY    . CATARACT EXTRACTION, BILATERAL Bilateral 1996    OB History    No data available       Home Medications    Prior to Admission medications   Medication Sig Start Date End Date Taking? Authorizing Provider  amLODipine (NORVASC) 5 MG tablet Take 5 mg by mouth daily.     Yes Historical Provider, MD  aspirin 81 MG tablet Take 81 mg by mouth daily.   Yes Historical Provider, MD  doxazosin (CARDURA) 2 MG tablet Take 2 mg by mouth daily. 09/27/13  Yes Historical Provider, MD  escitalopram (LEXAPRO) 5 MG tablet Take 5 mg by mouth daily.   Yes Historical Provider, MD  Fish Oil-Cholecalciferol (FISH OIL + D3 PO) Take 1 tablet by mouth daily.    Yes Historical Provider, MD  lisinopril-hydrochlorothiazide (PRINZIDE,ZESTORETIC) 20-25 MG per tablet Take 1 tablet by mouth daily. 05/03/14  Yes Historical Provider, MD  metoprolol tartrate (LOPRESSOR) 25 MG tablet Take 25 mg by mouth 2 (two) times daily.  03/18/13  Yes Historical Provider, MD  Multiple Vitamins-Minerals (MULTIVITAMIN  WITH MINERALS) tablet Take 1 tablet by mouth daily.   Yes Historical Provider, MD  rosuvastatin (CRESTOR) 5 MG tablet Take 5 mg by mouth daily. 06/07/15  Yes Historical Provider, MD  VENTOLIN HFA 108 (90 BASE) MCG/ACT inhaler Inhale 1 puff into the lungs 2 (two) times daily. 04/19/14  Yes Historical Provider, MD  ibuprofen (ADVIL,MOTRIN) 400 MG tablet Take 400 mg by mouth every 6 (six) hours as needed for headache, mild pain or moderate pain.    Historical Provider, MD  traMADol (ULTRAM) 50 MG tablet Take 1 tablet (50 mg total) by mouth every 6 (six) hours as needed. 02/14/16   Robyn Haber, MD    Family History Family History  Problem Relation Age of Onset  . Ovarian cancer Mother   . Hypertension Mother   . Hypertension Father   . Heart disease Father   . Hypertension Sister   . Hypertension Sister     Social History Social History  Substance Use Topics  . Smoking status: Never Smoker  . Smokeless tobacco: Never Used  . Alcohol use Yes     Comment: MIX OCCASIONALLY     Allergies   Sulfonamide derivatives   Review of Systems Review of Systems  Constitutional: Negative.   HENT: Negative.   Cardiovascular: Negative.   Musculoskeletal: Positive for arthralgias and gait problem.  Physical Exam Triage Vital Signs ED Triage Vitals  Enc Vitals Group     BP 02/14/16 1613 149/57     Pulse Rate 02/14/16 1613 65     Resp 02/14/16 1613 16     Temp 02/14/16 1613 98.5 F (36.9 C)     Temp Source 02/14/16 1613 Oral     SpO2 02/14/16 1613 96 %     Weight --      Height --      Head Circumference --      Peak Flow --      Pain Score 02/14/16 1618 8     Pain Loc --      Pain Edu? --      Excl. in [place]? --    No data found.   Updated Vital Signs BP 149/57 (BP Location: Left Arm)   Pulse 65   Temp 98.5 F (36.9 C) (Oral)   Resp 16   SpO2 96%   Physical Exam  Constitutional: She is oriented to person, place, and time. She appears well-developed and  well-nourished.  HENT:  Right Ear: External ear normal.  Left Ear: External ear normal.  Mouth/Throat: Oropharynx is clear and moist.  Eyes: Conjunctivae and EOM are normal.  Neck: Normal range of motion. Neck supple.  Pulmonary/Chest: Effort normal.  Musculoskeletal:  Marked swelling over the lateral aspect of her left ankle. There is no tenderness over any of the metatarsals. She does have tenderness over the left lateral malleolus.  Neurological: She is alert and oriented to person, place, and time.  Skin: Skin is warm and dry.  Nursing note and vitals reviewed.    UC Treatments / Results  Labs (all labs ordered are listed, but only abnormal results are displayed) Labs Reviewed - No data to display  EKG  EKG Interpretation None       Radiology No results found.  Procedures Procedures (including critical care time)  Medications Ordered in UC Medications - No data to display   Initial Impression / Assessment and Plan / UC Course  I have reviewed the triage vital signs and the nursing notes.  Pertinent labs & imaging results that were available during my care of the patient were reviewed by me and considered in my medical decision making (see chart for details).  Clinical Course      Final Clinical Impressions(s) / UC Diagnoses   Final diagnoses:  Sprain of left ankle, unspecified ligament, initial encounter    New Prescriptions New Prescriptions   TRAMADOL (ULTRAM) 50 MG TABLET    Take 1 tablet (50 mg total) by mouth every 6 (six) hours as needed.  crutches and cam walker   Robyn Haber, MD 02/14/16 1719

## 2016-02-15 ENCOUNTER — Ambulatory Visit: Payer: Medicare Other | Admitting: Physician Assistant

## 2016-02-20 ENCOUNTER — Encounter: Payer: Self-pay | Admitting: Nurse Practitioner

## 2016-02-20 ENCOUNTER — Encounter: Payer: Self-pay | Admitting: Gastroenterology

## 2016-02-20 ENCOUNTER — Ambulatory Visit (AMBULATORY_SURGERY_CENTER): Payer: Medicare Other | Admitting: Gastroenterology

## 2016-02-20 VITALS — BP 141/78 | HR 58 | Temp 97.1°F | Resp 15 | Ht 63.0 in | Wt 165.0 lb

## 2016-02-20 DIAGNOSIS — K921 Melena: Secondary | ICD-10-CM

## 2016-02-20 DIAGNOSIS — K317 Polyp of stomach and duodenum: Secondary | ICD-10-CM | POA: Diagnosis not present

## 2016-02-20 DIAGNOSIS — K2900 Acute gastritis without bleeding: Secondary | ICD-10-CM | POA: Diagnosis not present

## 2016-02-20 MED ORDER — SODIUM CHLORIDE 0.9 % IV SOLN: 500.0000 mL | INTRAVENOUS | Status: DC

## 2016-02-20 NOTE — Progress Notes (Signed)
Pt awake and sitting in fowler's position.  She denied and nausea at this time.  I asked if she wanted to stay or ready to go home.  Pt wanted to go home.  No further complaints at this time.  maw

## 2016-02-20 NOTE — Patient Instructions (Signed)
YOU HAD AN ENDOSCOPIC PROCEDURE TODAY AT [facility]:   Refer to the procedure report that was given to you for any specific questions about what was found during the examination.  If the procedure report does not answer your questions, please call your gastroenterologist to clarify.  If you requested that your care partner not be given the details of your procedure findings, then the procedure report has been included in a sealed envelope for you to review at your convenience later.  YOU SHOULD EXPECT: Some feelings of bloating in the abdomen. Passage of more gas than usual.  Walking can help get rid of the air that was put into your GI tract during the procedure and reduce the bloating. If you had a lower endoscopy (such as a colonoscopy or flexible sigmoidoscopy) you may notice spotting of blood in your stool or on the toilet paper. If you underwent a bowel prep for your procedure, you may not have a normal bowel movement for a few days.  Please Note:  You might notice some irritation and congestion in your nose or some drainage.  This is from the oxygen used during your procedure.  There is no need for concern and it should clear up in a day or so.  SYMPTOMS TO REPORT IMMEDIATELY:   Following lower endoscopy (colonoscopy or flexible sigmoidoscopy):  Excessive amounts of blood in the stool  Significant tenderness or worsening of abdominal pains  Swelling of the abdomen that is new, acute  Fever of 100F or higher   Following upper endoscopy (EGD)  Vomiting of blood or coffee ground material  New chest pain or pain under the shoulder blades  Painful or persistently difficult swallowing  New shortness of breath  Fever of 100F or higher  Black, tarry-looking stools  For urgent or emergent issues, a gastroenterologist can be reached at any hour by calling [phone].   DIET:  We do recommend a small meal at first, but then you may proceed to your regular diet.  Drink  plenty of fluids but you should avoid alcoholic beverages for 24 hours.  ACTIVITY:  You should plan to take it easy for the rest of today and you should NOT DRIVE or use heavy machinery until tomorrow (because of the sedation medicines used during the test).    FOLLOW UP: Our staff will call the number listed on your records the next business day following your procedure to check on you and address any questions or concerns that you may have regarding the information given to you following your procedure. If we do not reach you, we will leave a message.  However, if you are feeling well and you are not experiencing any problems, there is no need to return our call.  We will assume that you have returned to your regular daily activities without incident.  If any biopsies were taken you will be contacted by phone or by letter within the next 1-3 weeks.  Please call us at [phone] if you have not heard about the biopsies in 3 weeks.    SIGNATURES/CONFIDENTIALITY: You and/or your care partner have signed paperwork which will be entered into your electronic medical record.  These signatures attest to the fact that that the information above on your After Visit Summary has been reviewed and is understood.  Full responsibility of the confidentiality of this discharge information lies with you and/or your care-partner.   A card was given to your daughter giving information that  a metal clip was applied to body of your stomach. No aspirin, aspirin products,  ibuprofen, naproxen, advil, motrin, aleve, or other non-steroidal anti-inflammatory drugs for 14 days after polyp removal. You may resume your other current medications today. Await biopsy results. Consider repeat upper endoscopy for surveillance pending pathology results. Please call if any questions or concerns.

## 2016-02-20 NOTE — Progress Notes (Signed)
Report given to PACU RN, vss 

## 2016-02-20 NOTE — Progress Notes (Signed)
Pt very sleepy.  1115 pt still sleepy.  Pt c/o nausea.  Reported to Dr. Havery Moros.  He gave verbal order to administer zofran 4 mg po.  Ernestine Conrad, RN administer to pt at 11:20.  Maw  Pt also spit up clear mucous before swallowing pill.  maw

## 2016-02-20 NOTE — Op Note (Signed)
[place] Patient Name: Lauren Holloway Procedure Date: 02/20/2016 10:12 AM MRN: [id number] Endoscopist: Remo Lipps P. Armbruster MD, MD Age: 82 Referring MD:  Date of Birth: Apr 11, 1931 Gender: Female Account #: [id number] Procedure:                Upper GI endoscopy Indications:              reported symptoms of melena Medicines:                Monitored Anesthesia Care Procedure:                Pre-Anesthesia Assessment:                           - Prior to the procedure, a History and Physical                            was performed, and patient medications and                            allergies were reviewed. The patient's tolerance of                            previous anesthesia was also reviewed. The risks                            and benefits of the procedure and the sedation                            options and risks were discussed with the patient.                            All questions were answered, and informed consent                            was obtained. Prior Anticoagulants: The patient has                            taken aspirin, last dose was 1 day prior to                            procedure. ASA Grade Assessment: II - A patient                            with mild systemic disease. After reviewing the                            risks and benefits, the patient was deemed in                            satisfactory condition to undergo the procedure.                           After obtaining informed consent, the endoscope was  passed under direct vision. Throughout the                            procedure, the patient's blood pressure, pulse, and                            oxygen saturations were monitored continuously. The                            Model GIF-HQ190 [id number] [id number]) scope was introduced                            through the mouth, and advanced to the second part                            of duodenum. The  upper GI endoscopy was                            accomplished without difficulty. The patient                            tolerated the procedure well. Scope In: Scope Out: Findings:                 Esophagogastric landmarks were identified: the                            Z-line was found at 37 cm, the gastroesophageal                            junction was found at 37 cm and the upper extent of                            the gastric folds was found at 37 cm from the                            incisors.                           The exam of the esophagus was otherwise normal.                           A single 20 to 25 mm pedunculated polyp with                            inflammed appearance was found on the greater                            curvature of the distal gastric body. Area was                            successfully injected with 4 mL of a 1:10,000  solution of epinephrine for drug delivery prior to                            polypectomy attempt. The polyp was removed with a                            hot snare. Resection and retrieval were complete.                            To prevent bleeding after the polypectomy, one                            hemostatic clip was successfully placed across the                            defect. There was no bleeding during, or at the                            end, of the procedure.                           A single 3 mm sessile polyp with no stigmata of                            recent bleeding was found in the cardia. The polyp                            was removed with a cold biopsy forceps. Resection                            and retrieval were complete.                           The exam of the stomach was otherwise normal.                           The duodenal bulb and second portion of the                            duodenum were normal. Complications:            No immediate complications.  Estimated blood loss:                            Minimal. Estimated Blood Loss:     Estimated blood loss was minimal. Impression:               - Esophagogastric landmarks identified.                           - A single gastric polyp. Resected and retrieved.                            Injected. Clip was placed.                           -  A single gastric polyp. Resected and retrieved.                           - Normal duodenal bulb and second portion of the                            duodenum.                           Overall, I suspect symptoms are due to large                            inflamed gastric polyp. Recommendation:           - Patient has a contact number available for                            emergencies. The signs and symptoms of potential                            delayed complications were discussed with the                            patient. Return to normal activities tomorrow.                            Written discharge instructions were provided to the                            patient.                           - Resume previous diet.                           - Continue present medications.                           - No aspirin, ibuprofen, naproxen, or other                            non-steroidal anti-inflammatory drugs for 2 weeks                            after polyp removal.                           - Await pathology results.                           - Consideration for repeat upper endoscopy for                            surveillance pending pathology results Remo Lipps P. Armbruster MD, MD 02/20/2016 10:47:23 AM This report has been signed electronically.

## 2016-02-21 ENCOUNTER — Telehealth: Payer: Self-pay | Admitting: *Deleted

## 2016-02-21 NOTE — Telephone Encounter (Signed)
  Follow up Call-  Call back number 02/20/2016  Post procedure Call Back phone  # [phone]  Permission to leave phone message Yes  Some recent data might be hidden     Patient questions:  Do you have a fever, pain , or abdominal swelling? No. Pain Score  0 *  Have you tolerated food without any problems? Yes.    Have you been able to return to your normal activities? Yes.    Do you have any questions about your discharge instructions: Diet   No. Medications  No. Follow up visit  No.  Do you have questions or concerns about your Care? No.  Actions: * If pain score is 4 or above: No action needed, pain <4.

## 2016-02-29 ENCOUNTER — Other Ambulatory Visit: Payer: Self-pay

## 2016-02-29 DIAGNOSIS — A048 Other specified bacterial intestinal infections: Secondary | ICD-10-CM

## 2016-02-29 MED ORDER — OMEPRAZOLE 40 MG PO CPDR: 40.0000 mg | DELAYED_RELEASE_CAPSULE | Freq: Two times a day (BID) | ORAL | 0 refills | Status: DC

## 2016-02-29 MED ORDER — BIS SUBCIT-METRONID-TETRACYC 140-125-125 MG PO CAPS: 3.0000 | ORAL_CAPSULE | Freq: Three times a day (TID) | ORAL | 0 refills | Status: DC

## 2016-02-29 NOTE — Progress Notes (Signed)
Labs ordered.

## 2016-03-05 ENCOUNTER — Telehealth: Payer: Self-pay | Admitting: Gastroenterology

## 2016-03-05 MED ORDER — BIS SUBCIT-METRONID-TETRACYC 140-125-125 MG PO CAPS: 3.0000 | ORAL_CAPSULE | Freq: Three times a day (TID) | ORAL | 0 refills | Status: DC

## 2016-03-05 MED ORDER — OMEPRAZOLE 40 MG PO CPDR: 40.0000 mg | DELAYED_RELEASE_CAPSULE | Freq: Two times a day (BID) | ORAL | 0 refills | Status: DC

## 2016-03-05 NOTE — Telephone Encounter (Signed)
Spoke with pt and scripts were sent to [facility]. Scripts sent today to [facility] [address]. Pt aware.

## 2016-03-19 ENCOUNTER — Encounter (INDEPENDENT_AMBULATORY_CARE_PROVIDER_SITE_OTHER): Payer: Self-pay | Admitting: Orthopaedic Surgery

## 2016-03-19 ENCOUNTER — Ambulatory Visit (INDEPENDENT_AMBULATORY_CARE_PROVIDER_SITE_OTHER): Payer: Medicare Other | Admitting: Orthopaedic Surgery

## 2016-03-19 ENCOUNTER — Encounter (INDEPENDENT_AMBULATORY_CARE_PROVIDER_SITE_OTHER): Payer: Self-pay

## 2016-03-19 ENCOUNTER — Ambulatory Visit (INDEPENDENT_AMBULATORY_CARE_PROVIDER_SITE_OTHER): Payer: Medicare Other

## 2016-03-19 VITALS — BP 136/59 | HR 59 | Ht 63.0 in | Wt 165.0 lb

## 2016-03-19 DIAGNOSIS — M25572 Pain in left ankle and joints of left foot: Secondary | ICD-10-CM

## 2016-03-19 NOTE — Progress Notes (Signed)
Office Visit Note   Patient: Lauren Holloway           Date of Birth: [birth year]           MRN: [id number] Visit Date: 03/19/2016              Requested by: [name], MD [address] [place], [place] [address] PCP: Jerlyn Ly, MD   Assessment & Plan: Visit Diagnoses:  1. Pain in left ankle and joints of left foot        Grade 2 lateral ankle sprain  Plan: We'll apply Swede-O. She'll use the boot for about 10 days to 2 weeks. She can elevate her foot above her heart intermittently during the day to help with the swelling. She can remove it at night and for bathing will recheck in 4 weeks. She needs to elevate her foot due to the dependent edema.  Follow-Up Instructions: Return in about 4 weeks (around 04/16/2016).   Orders:  Orders Placed This Encounter  Procedures  . XR Ankle Complete Left   No orders of the defined types were placed in this encounter.     Procedures: No procedures performed   Clinical Data: No additional findings.   Subjective: Chief Complaint  Patient presents with  . Left Ankle - Follow-up    Patient presents with left ankle pain and swelling. She [facility] and had x-rays. She states that she was diagnosed with a sprain. She was given a boot to wear. She has continued to have pain and swelling. She is unable to wear any of her shoes due to the swelling. She has tried tylenol with no relief.  She is having great difficulty walking by the end of the day. She stopped the pill pack on the walk for a well with that since her ankle doesn't band and she is afraid she will fall. She only use the boot for about 1-1/2 weeks.  Review of Systems  Constitutional: Negative for chills and diaphoresis.  HENT: Negative for ear discharge, ear pain and nosebleeds.   Eyes: Negative for discharge and visual disturbance.  Respiratory: Negative for cough, choking and shortness of breath.   Cardiovascular: Negative for  chest pain and palpitations.  Gastrointestinal: Negative for abdominal distention and abdominal pain.  Endocrine: Negative for cold intolerance and heat intolerance.  Genitourinary: Negative for flank pain and hematuria.  Musculoskeletal:       Anterolateral ankle tenderness swelling over the dorsum of the foot limited range of motion decreased by 50%. Pulses are intact. 1+ anterior drawer. Syndesmosis is nontender. Negative proctoscopy compression test good knee range of motion.  Skin: Negative for rash and wound.  Neurological: Negative for seizures and speech difficulty.  Hematological: Negative for adenopathy. Does not bruise/bleed easily.  Psychiatric/Behavioral: Negative for agitation and suicidal ideas.     Objective: Vital Signs: BP (!) 136/59   Pulse (!) 59   Ht 5\' 3"  (1.6 m)   Wt 165 lb (74.8 kg)   BMI 29.23 kg/m   Physical Exam  Constitutional: She is oriented to person, place, and time. She appears well-developed.  HENT:  Head: Normocephalic.  Right Ear: External ear normal.  Left Ear: External ear normal.  Eyes: Pupils are equal, round, and reactive to light.  Neck: No tracheal deviation present. No thyromegaly present.  Cardiovascular: Normal rate.   Pulmonary/Chest: Effort normal.  Abdominal: Soft.  Musculoskeletal:  Diffuse swelling on ankle tenderness over the  anterior talofibular 1+ anterior drawer. Good capillary refill pulses are 2+ knee range of motion is full. Negative straight leg raising 90. She is a mature with a short stride gait minimal ankle range of motion keeps her foot in external rotation to minimize ankle dorsiflexion.  Neurological: She is alert and oriented to person, place, and time.  Skin: Skin is warm and dry.  Psychiatric: She has a normal mood and affect. Her behavior is normal.    Ortho Exam syndesmosis is nontender. Proximal fibula is normal post pressure is normal. Achilles tendon and plantar fascia is normal. No plantar foot  lesions.  Specialty Comments:  No specialty comments available.  Imaging: Xr Ankle Complete Left  Result Date: 03/19/2016 X-rays left ankle obtained and reviewed AP lateral mortise view. This is negative for fracture she has significant soft tissue swelling at the ankle joint dorsally and also laterally. Impression: Left ankle soft tissue swelling x-rays negative for fracture    PMFS History: Patient Active Problem List   Diagnosis Date Noted  . Dyspnea 10/25/2013  . Essential hypertension 05/18/2010  . ABDOMINAL PAIN RIGHT LOWER QUADRANT 05/18/2010  . PERSONAL HISTORY OF COLONIC POLYPS 05/18/2010   Past Medical History:  Diagnosis Date  . Cataract   . Hyperlipidemia   . Hypertension     Family History  Problem Relation Age of Onset  . Ovarian cancer Mother   . Hypertension Mother   . Hypertension Father   . Heart disease Father   . Hypertension Sister   . Hypertension Sister     Past Surgical History:  Procedure Laterality Date  . ABDOMINAL HYSTERECTOMY    . APPENDECTOMY    . BILATERAL OOPHORECTOMY    . CATARACT EXTRACTION, BILATERAL Bilateral 1996   Social History   Occupational History  . Not on file.   Social History Main Topics  . Smoking status: Never Smoker  . Smokeless tobacco: Never Used  . Alcohol use Yes     Comment: MIX OCCASIONALLY  . Drug use: No  . Sexual activity: Not on file

## 2016-04-23 ENCOUNTER — Encounter (INDEPENDENT_AMBULATORY_CARE_PROVIDER_SITE_OTHER): Payer: Self-pay | Admitting: Orthopaedic Surgery

## 2016-04-23 ENCOUNTER — Ambulatory Visit (INDEPENDENT_AMBULATORY_CARE_PROVIDER_SITE_OTHER): Payer: Medicare Other | Admitting: Orthopaedic Surgery

## 2016-04-23 ENCOUNTER — Encounter (INDEPENDENT_AMBULATORY_CARE_PROVIDER_SITE_OTHER): Payer: Self-pay

## 2016-04-23 VITALS — BP 128/64 | HR 60 | Ht 63.0 in | Wt 165.0 lb

## 2016-04-23 DIAGNOSIS — M25572 Pain in left ankle and joints of left foot: Secondary | ICD-10-CM | POA: Diagnosis not present

## 2016-04-23 NOTE — Progress Notes (Signed)
Office Visit Note   Patient: Lauren Holloway           Date of Birth: 1932/01/15           MRN: [id number] Visit Date: 04/23/2016              Requested by: [name], MD [address] [place], [place] [address] PCP: Jerlyn Ly, MD   Assessment & Plan: Visit Diagnoses:  1. Pain in left ankle and joints of left foot          Persistently painful post conservative treatment for ankle sprain.  Plan: Patient sent persistent pain and difficulty walking it at the end of the day since her ankle sprain in December 2017. We'll set her up for some physical therapy for strengthening and edema reduction and check her back again in 4 weeks  Follow-Up Instructions: Return in about 4 weeks (around 05/21/2016).   Orders:  No orders of the defined types were placed in this encounter.  No orders of the defined types were placed in this encounter.     Procedures: No procedures performed   Clinical Data: No additional findings.   Subjective: Chief Complaint  Patient presents with  . Left Ankle - Follow-up    Patient returns for four week follow up Grade 2 lateral ankle sprain left ankle. She states that she is not doing much better. She continues to have swelling. She tried to walk yesterday and states that she almost couldn't get back. She does have to be up on her feet a lot at work. She has worn the ASO, but does not have it on today due to it not fitting in her shoes. She states that she bought an elastic ankle sleeve to wear and that helps some.     Review of Systems  Constitutional: Negative for chills and diaphoresis.  HENT: Negative for ear discharge, ear pain and nosebleeds.   Eyes: Negative for discharge and visual disturbance.  Respiratory: Negative for cough, choking and shortness of breath.   Cardiovascular: Negative for chest pain and palpitations.  Gastrointestinal: Negative for abdominal distention and abdominal pain.  Endocrine: Negative for cold intolerance and  heat intolerance.  Genitourinary: Negative for flank pain and hematuria.  Musculoskeletal:       Persistent ankle pain since her injury before Christmas. She used to cam boot and then was switched to suite of the [facility] doesn't fit in many overnight stylish shoes she's been using elastic sleeve. At the in the day she states she limps and has some problems walking with increased pain and edema. Swelling goes down the morning  Skin: Negative for rash and wound.  Neurological: Negative for seizures and speech difficulty.  Hematological: Negative for adenopathy. Does not bruise/bleed easily.  Psychiatric/Behavioral: Negative for agitation and suicidal ideas.     Objective: Vital Signs: BP 128/64   Pulse 60   Ht 5\' 3"  (1.6 m)   Wt 165 lb (74.8 kg)   BMI 29.23 kg/m   Physical Exam  Constitutional: She is oriented to person, place, and time. She appears well-developed.  HENT:  Head: Normocephalic.  Right Ear: External ear normal.  Left Ear: External ear normal.  Eyes: Pupils are equal, round, and reactive to light.  Neck: No tracheal deviation present. No thyromegaly present.  Cardiovascular: Normal rate.   Pulmonary/Chest: Effort normal.  Abdominal: Soft.  Musculoskeletal:  Patient's tenderness from anteromedial across the anterior aspect of the ankle joint all the way to the tip  of the fibula. Syndesmosis is nontender. She has some opaque pitting edema in the foot. No tenderness along the saphenous vein. Anterior tib peroneals are strong.  Neurological: She is alert and oriented to person, place, and time.  Skin: Skin is warm and dry.  Psychiatric: She has a normal mood and affect. Her behavior is normal.    Ortho Exam negative anterior drawer. Posterior tib anterior tib peroneals are strong. No tenderness of the talonavicular or calcaneocuboid joint. Good endpoint for anterior drawer. No lateral talar uncoverage. Anterior ankle is tender across the front of the joint minimal  tenderness over the syndesmosis anterior talofibular ligament region is very tender.  Specialty Comments:  No specialty comments available.  Imaging: No results found.   PMFS History: Patient Active Problem List   Diagnosis Date Noted  . Dyspnea 10/25/2013  . Essential hypertension 05/18/2010  . ABDOMINAL PAIN RIGHT LOWER QUADRANT 05/18/2010  . PERSONAL HISTORY OF COLONIC POLYPS 05/18/2010   Past Medical History:  Diagnosis Date  . Cataract   . Hyperlipidemia   . Hypertension     Family History  Problem Relation Age of Onset  . Ovarian cancer Mother   . Hypertension Mother   . Hypertension Father   . Heart disease Father   . Hypertension Sister   . Hypertension Sister     Past Surgical History:  Procedure Laterality Date  . ABDOMINAL HYSTERECTOMY    . APPENDECTOMY    . BILATERAL OOPHORECTOMY    . CATARACT EXTRACTION, BILATERAL Bilateral 1996   Social History   Occupational History  . Not on file.   Social History Main Topics  . Smoking status: Never Smoker  . Smokeless tobacco: Never Used  . Alcohol use Yes     Comment: MIX OCCASIONALLY  . Drug use: No  . Sexual activity: Not on file

## 2016-04-30 ENCOUNTER — Encounter: Payer: Self-pay | Admitting: Physical Therapy

## 2016-04-30 ENCOUNTER — Ambulatory Visit: Payer: Medicare Other | Attending: Orthopaedic Surgery | Admitting: Physical Therapy

## 2016-04-30 DIAGNOSIS — R600 Localized edema: Secondary | ICD-10-CM | POA: Diagnosis present

## 2016-04-30 DIAGNOSIS — M25672 Stiffness of left ankle, not elsewhere classified: Secondary | ICD-10-CM | POA: Diagnosis not present

## 2016-04-30 DIAGNOSIS — M25572 Pain in left ankle and joints of left foot: Secondary | ICD-10-CM

## 2016-04-30 DIAGNOSIS — M6281 Muscle weakness (generalized): Secondary | ICD-10-CM | POA: Diagnosis present

## 2016-04-30 DIAGNOSIS — R262 Difficulty in walking, not elsewhere classified: Secondary | ICD-10-CM

## 2016-05-01 ENCOUNTER — Encounter: Payer: Self-pay | Admitting: Physical Therapy

## 2016-05-01 NOTE — Therapy (Signed)
[place], [place], [address] Phone: [phone]   Fax:  [phone]  Physical Therapy Evaluation  Patient Details  Name: Lauren Holloway MRN: [id number] Date of Birth: 1932-03-01 Referring Provider: Dr Rodell Perna   Encounter Date: 04/30/2016      PT End of Session - 05/01/16 1311    Visit Number 1   Number of Visits 16   Date for PT Re-Evaluation 06/26/16   Authorization Type Medicare    Activity Tolerance Patient tolerated treatment well   Behavior During Therapy [facility] for tasks assessed/performed      Past Medical History:  Diagnosis Date  . Cataract   . Hyperlipidemia   . Hypertension     Past Surgical History:  Procedure Laterality Date  . ABDOMINAL HYSTERECTOMY    . APPENDECTOMY    . BILATERAL OOPHORECTOMY    . CATARACT EXTRACTION, BILATERAL Bilateral 1996    There were no vitals filed for this visit.       Subjective Assessment - 04/30/16 1536    Subjective Patient got out her car on Decemebr 18th and walked around her car and fell. She has not fallen before. She sustained a lateral ankle sprain. Since that point she has not had any significant imporovement. She continues to have swelling. She has difficult putting full weight through her foot when ambulating. She is active. She goes to the gym and walks for exercise. She reports pain in her anterior ankle and lateral ankle.    Limitations Standing;Walking   How long can you sit comfortably? No limit    How long can you stand comfortably? < 20 min    How long can you walk comfortably? limited community distances without increased pain    Diagnostic tests X-ray: (-) for fracture    Patient Stated Goals To return to walking    Currently in Pain? Yes   Pain Score 6    Pain Location Ankle   Pain Orientation Left   Pain Descriptors / Indicators Aching   Pain Type Acute pain   Pain Onset More than a month ago   Pain Frequency Constant   increases with walking    Aggravating Factors  ambualtion    Pain Relieving Factors rest and ice    Effect of Pain on Daily Activities difficulty walking for IADL's and for exercises             [facility] PT Assessment - 05/01/16 0001      Assessment   Medical Diagnosis Left ankle sprain    Referring Provider Dr Rodell Perna    Onset Date/Surgical Date 02/19/16   Hand Dominance Right   Next MD Visit 05/28/2016   Prior Therapy None     Precautions   Precautions None     Restrictions   Weight Bearing Restrictions No     Balance Screen   Has the patient fallen in the past 6 months Yes   How many times? 1  Just an accident. No pattern of falls    Has the patient had a decrease in activity level because of a fear of falling?  Yes   Is the patient reluctant to leave their home because of a fear of falling?  No     Home Environment   Additional Comments No steps at her house.      Prior Function   Level of Independence Independent   Vocation Retired   Leisure Going to [employer]  Cognition   Overall Cognitive Status Within Functional Limits for tasks assessed   Attention Focused   Focused Attention Appears intact   Memory Appears intact   Awareness Appears intact   Problem Solving Appears intact     Observation/Other Assessments   Observations high arches billateral rear foot supination.      Observation/Other Assessments-Edema    Edema Figure 8     Figure 8 Edema   Figure 8 - Right  48.5 cm    Figure 8 - Left  51.5 cm      Sensation   Light Touch Appears Intact   Additional Comments densies parathesias      Coordination   Gross Motor Movements are Fluid and Coordinated No   Fine Motor Movements are Fluid and Coordinated No     Posture/Postural Control   Posture/Postural Control No significant limitations     AROM   Left Ankle Dorsiflexion -10   Left Ankle Plantar Flexion 28   Left Ankle Inversion 15   Left Ankle Eversion 5     PROM   Overall PROM   Deficits   Right Ankle Dorsiflexion 8   Right Ankle Plantar Flexion 10   Right Ankle Inversion 25   Right Ankle Eversion 10   Left Ankle Dorsiflexion -8   Left Ankle Inversion `8   Left Ankle Eversion 5     Strength   Right Ankle Dorsiflexion 5/5   Right Ankle Plantar Flexion 5/5   Right Ankle Inversion 5/5   Right Ankle Eversion 5/5   Left Ankle Dorsiflexion 3/5   Left Ankle Plantar Flexion 1/5   Left Ankle Inversion 3/5   Left Ankle Eversion 3/5     Palpation   Palpation comment tednernes sto palpation around the ATFL and the anterior talus      Ambulation/Gait   Gait Comments decreased weight bearing on the left. Decreased heel strike,      High Level Balance   High Level Balance Comments unable to perform single leg stance                    [facility] Adult PT Treatment/Exercise - 05/01/16 0001      Ankle Exercises: Supine   Other Supine Ankle Exercises DF/PF 2x10 EV/ IV 2x10; passive DF stretch 2x10                 PT Education - 05/01/16 1311    Education provided Yes   Education Details improtance of increasing DF, HEP, symptom mangement, edema mangement    Person(s) Educated Patient   Methods Explanation;Demonstration   Comprehension Verbalized understanding;Returned demonstration          PT Short Term Goals - 05/01/16 1325      PT SHORT TERM GOAL #1   Title Patient will improve passive left dorsiflexion to neutral    Baseline -6    Time 4   Period Weeks   Status New     PT SHORT TERM GOAL #2   Title Patient will report no pain with active EV/IV    Time 4   Period Weeks     PT SHORT TERM GOAL #3   Title Patients figure 8 swelling on the left will be within 1 cm of the right    Time 4   Period Weeks   Status New     PT SHORT TERM GOAL #4   Title Patient will be independent with initial HEP    Time 4  Period Weeks   Status New           PT Long Term Goals - 05/09/2016 1327      PT LONG TERM GOAL #1   Title Patient  will stand for 1 hour without increased pain inorder to go shopping    Time 8   Period Weeks   Status New     PT LONG TERM GOAL #2   Title Patient will go up/down 8 steps without increased pain to improve community mobility.   Time 8   Period Weeks   Status New     PT LONG TERM GOAL #3   Title Patient will return to the gym without increased pain and swelling    Time 8   Period Weeks   Status New     PT LONG TERM GOAL #4   Title Patient will demsotrate  a 36% deficit on FOTO    Time 8   Period Weeks   Status New               Plan - 2016-05-09 1314    Clinical Impression Statement Patient is an 83 year old female S/P Ankle sprain in December. She continues to have swelling and pain with ambulation. She is lacking motion with all movements of her ankle but with DF in particular. She has decreased stability in her left ankle. She is very active. She would benefit from skilled therapy to improve motion and stability. She has edema which is likley caused by her lack of motion. She was seen for sa low complexity evaluaion.    Rehab Potential Good   PT Frequency 2x / week   PT Duration 8 weeks   PT Treatment/Interventions ADLs/Self Care Home Management;Cryotherapy;Electrical Stimulation;Iontophoresis 4mg /ml Dexamethasone;Moist Heat;Ultrasound;Stair training;Gait training;Patient/family education;Neuromuscular re-education;Therapeutic exercise;Therapeutic activities;Manual techniques;Passive range of motion;Taping   PT Next Visit Plan continue with mobilization of the ankle, work on edema massage, add light band strenghtening if able, work on standing weight shift; consider seated heel raise.    PT Home Exercise Plan self DF stretch, ankle pumps, ankle EV/ IV    Consulted and Agree with Plan of Care Patient      Patient will benefit from skilled therapeutic intervention in order to improve the following deficits and impairments:  Decreased activity tolerance, Decreased mobility,  Decreased strength, Pain, Decreased range of motion, Abnormal gait, Difficulty walking  Visit Diagnosis: Stiffness of left ankle, not elsewhere classified  Pain in left ankle and joints of left foot  Difficulty in walking, not elsewhere classified  Localized edema  Muscle weakness (generalized)      G-Codes - May 09, 2016 1436    Functional Assessment Tool Used (Outpatient Only) FOTO, clinical decsion making    Functional Limitation Mobility: Walking and moving around   Mobility: Walking and Moving Around Current Status [phone]) At least 40 percent but less than 60 percent impaired, limited or restricted   Mobility: Walking and Moving Around Goal Status [phone]) At least 20 percent but less than 40 percent impaired, limited or restricted       Problem List Patient Active Problem List   Diagnosis Date Noted  . Dyspnea 10/25/2013  . Essential hypertension 05/18/2010  . ABDOMINAL PAIN RIGHT LOWER QUADRANT 05/18/2010  . PERSONAL HISTORY OF COLONIC POLYPS 05/18/2010    Carney Living PT DPT  05-09-16, 2:42 PM  [facility] [address] [place], [place], [address] Phone: [phone]   Fax:  [phone]  Name: Lauren  WRENLEE Holloway MRN: [id number] Date of Birth: 02/27/1932

## 2016-05-03 ENCOUNTER — Ambulatory Visit: Payer: Medicare Other | Admitting: Physical Therapy

## 2016-05-07 ENCOUNTER — Ambulatory Visit: Payer: Medicare Other | Attending: Orthopaedic Surgery | Admitting: Physical Therapy

## 2016-05-07 DIAGNOSIS — R600 Localized edema: Secondary | ICD-10-CM | POA: Insufficient documentation

## 2016-05-07 DIAGNOSIS — R262 Difficulty in walking, not elsewhere classified: Secondary | ICD-10-CM

## 2016-05-07 DIAGNOSIS — M25572 Pain in left ankle and joints of left foot: Secondary | ICD-10-CM

## 2016-05-07 DIAGNOSIS — M6281 Muscle weakness (generalized): Secondary | ICD-10-CM | POA: Diagnosis present

## 2016-05-07 DIAGNOSIS — M25672 Stiffness of left ankle, not elsewhere classified: Secondary | ICD-10-CM | POA: Diagnosis present

## 2016-05-07 NOTE — Therapy (Signed)
[place], [place], [address] Phone: [phone]   Fax:  [phone]  Physical Therapy Treatment  Patient Details  Name: Lauren Holloway MRN: [id number] Date of Birth: 1931/11/23 Referring Provider: Dr Rodell Perna   Encounter Date: 05/07/2016      PT End of Session - 05/07/16 1531    Visit Number 2   Number of Visits 16   Date for PT Re-Evaluation 06/26/16   Authorization Type Medicare    PT Start Time 0300   PT Stop Time 0348   PT Time Calculation (min) 48 min      Past Medical History:  Diagnosis Date  . Cataract   . Hyperlipidemia   . Hypertension     Past Surgical History:  Procedure Laterality Date  . ABDOMINAL HYSTERECTOMY    . APPENDECTOMY    . BILATERAL OOPHORECTOMY    . CATARACT EXTRACTION, BILATERAL Bilateral 1996    There were no vitals filed for this visit.      Subjective Assessment - 05/07/16 1502    Subjective The swelling has reduced alot.   Currently in Pain? Yes   Pain Score 9    Pain Location Ankle   Pain Orientation Left   Pain Descriptors / Indicators Throbbing   Aggravating Factors  ambulation   Pain Relieving Factors rest ice                          [facility] Adult PT Treatment/Exercise - 05/07/16 0001      Modalities   Modalities Cryotherapy     Cryotherapy   Number Minutes Cryotherapy 10 Minutes   Cryotherapy Location Ankle   Type of Cryotherapy Ice pack     Manual Therapy   Manual Therapy Joint mobilization;Soft tissue mobilization   Joint Mobilization A/P, P/A ankle mobs, metatarsal mobs   Soft tissue mobilization soft tissue work to lateral dorsal foot -very tender      Ankle Exercises: Stretches   Press photographer Limitations DF stretch with towel     Ankle Exercises: Seated   Towel Crunch 5 reps   Towel Inversion/Eversion 5 reps   Heel Raises 20 reps   Toe Raise 20 reps   Other Seated Ankle Exercises inversion and eversion  isometric 10 x 2 each      Ankle Exercises: Standing   Other Standing Ankle Exercises weight shifting onto left in barefeet- 10 sex x 5                   PT Short Term Goals - 05/01/16 1325      PT SHORT TERM GOAL #1   Title Patient will improve passive left dorsiflexion to neutral    Baseline -6    Time 4   Period Weeks   Status New     PT SHORT TERM GOAL #2   Title Patient will report no pain with active EV/IV    Time 4   Period Weeks     PT SHORT TERM GOAL #3   Title Patients figure 8 swelling on the left will be within 1 cm of the right    Time 4   Period Weeks   Status New     PT SHORT TERM GOAL #4   Title Patient will be independent with initial HEP    Time 4   Period Weeks   Status New  PT Long Term Goals - 05/01/16 1327      PT LONG TERM GOAL #1   Title Patient will stand for 1 hour without increased pain inorder to go shopping    Time 8   Period Weeks   Status New     PT LONG TERM GOAL #2   Title Patient will go up/down 8 steps without increased pain to improve community mobility.   Time 8   Period Weeks   Status New     PT LONG TERM GOAL #3   Title Patient will return to the gym without increased pain and swelling    Time 8   Period Weeks   Status New     PT LONG TERM GOAL #4   Title Patient will demsotrate  a 36% deficit on FOTO    Time 8   Period Weeks   Status New               Plan - 05/07/16 1551    Clinical Impression Statement Pt reports overall less pain however 9/10 pai today, maybe due to the weather She reports less sweeling since last visit.  Mobs and passive ROM used to increase ankle mobility. She reports one area of pain at anterior lateral ankle that is still very tender. AROM inversion and eversion is painful so gave pt isometrics for HEP without increased pain. She reports she is always in shoes with heels. Began weight shifting in barefoot and standing. Pt feels stretching in calf., Asked her to  spend sometime barefoot at home to assist with recovery of DF ROM.    PT Next Visit Plan continue with mobilization of the ankle, work on edema massage,check isometrics ;  add light band strenghtening if able, work on standing weight shift; consider seated heel raise.    PT Home Exercise Plan self DF stretch, ankle pumps, ankle EV/ IV ( IV.EV isometrics) standing weight shift- no shoes/ no heels   Consulted and Agree with Plan of Care Patient      Patient will benefit from skilled therapeutic intervention in order to improve the following deficits and impairments:  Decreased activity tolerance, Decreased mobility, Decreased strength, Pain, Decreased range of motion, Abnormal gait, Difficulty walking  Visit Diagnosis: Stiffness of left ankle, not elsewhere classified  Difficulty in walking, not elsewhere classified  Pain in left ankle and joints of left foot  Localized edema  Muscle weakness (generalized)     Problem List Patient Active Problem List   Diagnosis Date Noted  . Dyspnea 10/25/2013  . Essential hypertension 05/18/2010  . ABDOMINAL PAIN RIGHT LOWER QUADRANT 05/18/2010  . PERSONAL HISTORY OF COLONIC POLYPS 05/18/2010    Dorene Ar, PTA 05/07/2016, 4:04 PM  [facility] [address] [place], [place], [address] Phone: [phone]   Fax:  [phone]  Name: Lauren Holloway MRN: [id number] Date of Birth: 24-Mar-1931

## 2016-05-08 ENCOUNTER — Encounter: Payer: Self-pay | Admitting: Physical Therapy

## 2016-05-08 ENCOUNTER — Ambulatory Visit: Payer: Medicare Other | Admitting: Physical Therapy

## 2016-05-08 DIAGNOSIS — R600 Localized edema: Secondary | ICD-10-CM

## 2016-05-08 DIAGNOSIS — M25572 Pain in left ankle and joints of left foot: Secondary | ICD-10-CM

## 2016-05-08 DIAGNOSIS — M6281 Muscle weakness (generalized): Secondary | ICD-10-CM

## 2016-05-08 DIAGNOSIS — M25672 Stiffness of left ankle, not elsewhere classified: Secondary | ICD-10-CM

## 2016-05-08 DIAGNOSIS — R262 Difficulty in walking, not elsewhere classified: Secondary | ICD-10-CM

## 2016-05-08 NOTE — Therapy (Signed)
[place], [place], [address] Phone: [phone]   Fax:  [phone]  Physical Therapy Treatment  Patient Details  Name: Lauren Holloway MRN: [id number] Date of Birth: 1931-12-23 Referring Provider: Dr Rodell Perna   Encounter Date: 05/08/2016      PT End of Session - 05/08/16 1442    Visit Number 3   Number of Visits 16   Date for PT Re-Evaluation 06/26/16   Authorization Type Medicare    PT Start Time 1330   PT Stop Time 1413   PT Time Calculation (min) 43 min   Activity Tolerance Patient tolerated treatment well   Behavior During Therapy [facility] for tasks assessed/performed      Past Medical History:  Diagnosis Date  . Cataract   . Hyperlipidemia   . Hypertension     Past Surgical History:  Procedure Laterality Date  . ABDOMINAL HYSTERECTOMY    . APPENDECTOMY    . BILATERAL OOPHORECTOMY    . CATARACT EXTRACTION, BILATERAL Bilateral 1996    There were no vitals filed for this visit.      Subjective Assessment - 05/08/16 1357    Subjective Patient reports this morning was the first time in a long time that she woke up without pain. She is still having swelling but it has improved.    Limitations Standing;Walking   How long can you sit comfortably? No limit    How long can you stand comfortably? < 20 min    How long can you walk comfortably? limited community distances without increased pain    Diagnostic tests X-ray: (-) for fracture    Patient Stated Goals To return to walking    Currently in Pain? Yes   Pain Score 2    Pain Location Ankle   Pain Orientation Left   Pain Descriptors / Indicators Aching   Pain Type Acute pain   Pain Onset More than a month ago   Pain Frequency Constant   Aggravating Factors  ambualtion    Pain Relieving Factors rest, ice    Effect of Pain on Daily Activities difficulty walking IADL's                          [facility] Adult PT  Treatment/Exercise - 05/08/16 0001      Manual Therapy   Manual Therapy Joint mobilization;Soft tissue mobilization   Joint Mobilization A/P, P/A ankle mobs, metatarsal mobs   Soft tissue mobilization soft tissue work to lateral dorsal foot -very tender      Ankle Exercises: Seated   Towel Crunch 5 reps  2 sets   Towel Inversion/Eversion 5 reps  2sets    Heel Raises 20 reps   Toe Raise 20 reps     Ankle Exercises: Stretches   Gastroc Stretch Limitations DF stretch with towel     Ankle Exercises: Supine   Other Supine Ankle Exercises DF x10 yellow PF x10 yellow;  EV/ IV 2x10; passive DF stretch 2x10                 PT Education - 05/08/16 1441    Education provided Yes   Education Details rationale behind standing exercises    Person(s) Educated Patient   Methods Explanation;Demonstration   Comprehension Verbalized understanding;Returned demonstration          PT Short Term Goals - 05/08/16 1447      PT SHORT TERM GOAL #1  Title Patient will improve passive left dorsiflexion to neutral    Baseline to nuetral with stretching    Time 4   Period Weeks   Status On-going     PT SHORT TERM GOAL #2   Title Patient will report no pain with active EV/IV    Baseline continues to be painful but improved movement    Time 4   Period Weeks   Status On-going     PT SHORT TERM GOAL #3   Title Patients figure 8 swelling on the left will be within 1 cm of the right    Baseline continues to have swelling    Time 4   Period Weeks   Status On-going     PT SHORT TERM GOAL #4   Title Patient will be independent with initial HEP    Time 4   Period Weeks   Status On-going           PT Long Term Goals - 05/01/16 1327      PT LONG TERM GOAL #1   Title Patient will stand for 1 hour without increased pain inorder to go shopping    Time 8   Period Weeks   Status New     PT LONG TERM GOAL #2   Title Patient will go up/down 8 steps without increased pain to  improve community mobility.   Time 8   Period Weeks   Status New     PT LONG TERM GOAL #3   Title Patient will return to the gym without increased pain and swelling    Time 8   Period Weeks   Status New     PT LONG TERM GOAL #4   Title Patient will demsotrate  a 36% deficit on FOTO    Time 8   Period Weeks   Status New               Plan - 05/08/16 1445    Clinical Impression Statement Patient tolerated treatment well. Therapy worked on standing forward and side to side weight shift with the patient. she had some pain with forward weight shift. Therapy added light resistance to ankle DF/ PF. Her DF was moeasured at neutral passivley. She was given an updated HEP. She was advised to  watch her symptoms.     Rehab Potential Good   PT Frequency 2x / week   PT Duration 8 weeks   PT Treatment/Interventions ADLs/Self Care Home Management;Cryotherapy;Electrical Stimulation;Iontophoresis 4mg /ml Dexamethasone;Moist Heat;Ultrasound;Stair training;Gait training;Patient/family education;Neuromuscular re-education;Therapeutic exercise;Therapeutic activities;Manual techniques;Passive range of motion;Taping   PT Next Visit Plan continue with mobilization of the ankle, work on edema massage,check isometrics ;  add light band strenghtening if able, work on standing weight shift; consider seated heel raise.    PT Home Exercise Plan self DF stretch, ankle pumps, ankle EV/ IV ( IV.EV isometrics) standing weight shift- no shoes/ no heels   Consulted and Agree with Plan of Care Patient      Patient will benefit from skilled therapeutic intervention in order to improve the following deficits and impairments:  Decreased activity tolerance, Decreased mobility, Decreased strength, Pain, Decreased range of motion, Abnormal gait, Difficulty walking  Visit Diagnosis: Stiffness of left ankle, not elsewhere classified  Difficulty in walking, not elsewhere classified  Pain in left ankle and joints of  left foot  Localized edema  Muscle weakness (generalized)     Problem List Patient Active Problem List   Diagnosis Date Noted  . Dyspnea 10/25/2013  .  Essential hypertension 05/18/2010  . ABDOMINAL PAIN RIGHT LOWER QUADRANT 05/18/2010  . PERSONAL HISTORY OF COLONIC POLYPS 05/18/2010    Carney Living  PT DPT  05/08/2016, 2:49 PM  [facility] [address] [place], [place], [address] Phone: [phone]   Fax:  [phone]  Name: Lauren Holloway MRN: [id number] Date of Birth: [birth year]

## 2016-05-14 ENCOUNTER — Ambulatory Visit: Payer: Medicare Other | Admitting: Physical Therapy

## 2016-05-16 ENCOUNTER — Encounter: Payer: Self-pay | Admitting: Physical Therapy

## 2016-05-16 ENCOUNTER — Ambulatory Visit: Payer: Medicare Other | Admitting: Physical Therapy

## 2016-05-16 DIAGNOSIS — M25672 Stiffness of left ankle, not elsewhere classified: Secondary | ICD-10-CM | POA: Diagnosis not present

## 2016-05-16 DIAGNOSIS — R600 Localized edema: Secondary | ICD-10-CM

## 2016-05-16 DIAGNOSIS — M25572 Pain in left ankle and joints of left foot: Secondary | ICD-10-CM

## 2016-05-16 DIAGNOSIS — R262 Difficulty in walking, not elsewhere classified: Secondary | ICD-10-CM

## 2016-05-16 DIAGNOSIS — M6281 Muscle weakness (generalized): Secondary | ICD-10-CM

## 2016-05-16 NOTE — Therapy (Signed)
[place], [place], [address] Phone: [phone]   Fax:  [phone]  Physical Therapy Treatment  Patient Details  Name: Lauren Holloway MRN: [id number] Date of Birth: 04/21/1931 Referring Provider: Dr Rodell Perna   Encounter Date: 05/16/2016      PT End of Session - 05/16/16 1412    Visit Number 4   Number of Visits 16   Date for PT Re-Evaluation 06/26/16   PT Start Time 1331   PT Stop Time 1412   PT Time Calculation (min) 41 min   Activity Tolerance Patient tolerated treatment well   Behavior During Therapy [facility] for tasks assessed/performed      Past Medical History:  Diagnosis Date  . Cataract   . Hyperlipidemia   . Hypertension     Past Surgical History:  Procedure Laterality Date  . ABDOMINAL HYSTERECTOMY    . APPENDECTOMY    . BILATERAL OOPHORECTOMY    . CATARACT EXTRACTION, BILATERAL Bilateral 1996    There were no vitals filed for this visit.      Subjective Assessment - 05/16/16 1334    Subjective "I feel like I am getting better, i am doing the exercises but just haven't been as consistent with them"    Currently in Pain? Yes   Pain Score 7    Pain Location Ankle   Pain Orientation Left   Pain Descriptors / Indicators Sore   Pain Type Chronic pain   Pain Onset More than a month ago   Pain Frequency Constant   Aggravating Factors  N/A                         [facility] Adult PT Treatment/Exercise - 05/16/16 0001      Knee/Hip Exercises: Supine   Other Supine Knee/Hip Exercises clamshells 2 x 10  with red theraband     Manual Therapy   Manual therapy comments manual trigger point release over the fibularis tertius   Joint Mobilization A/P, P/A ankle mobs, metatarsal mobs   Soft tissue mobilization soft tissue work to lateral dorsal foot -very tender      Ankle Exercises: Standing   Heel Raises 10 reps  x 2 sets   Toe Raise 10 reps  x 2 sets     Ankle  Exercises: Seated   Towel Crunch 4 reps   Heel Raises 15 reps   Toe Raise 15 reps   Other Seated Ankle Exercises rockerboard 2 x 12 with DF/PF, and 2 x 12 INV/ EV  verbal/ tactile cues to keep knee still and only move ankle     Ankle Exercises: Supine   Other Supine Ankle Exercises DF x10 red PF x10 yellow;  EV/ IV 2x10; passive DF stretch 2x10      Ankle Exercises: Stretches   Slant Board Stretch 2 reps;30 seconds                PT Education - 05/16/16 1409    Education provided Yes   Education Details updated HEP for standing heel/ toe raise   Person(s) Educated Patient   Methods Explanation;Verbal cues;Demonstration;Handout   Comprehension Verbalized understanding;Returned demonstration;Verbal cues required          PT Short Term Goals - 05/08/16 1447      PT SHORT TERM GOAL #1   Title Patient will improve passive left dorsiflexion to neutral    Baseline to nuetral with stretching  Time 4   Period Weeks   Status On-going     PT SHORT TERM GOAL #2   Title Patient will report no pain with active EV/IV    Baseline continues to be painful but improved movement    Time 4   Period Weeks   Status On-going     PT SHORT TERM GOAL #3   Title Patients figure 8 swelling on the left will be within 1 cm of the right    Baseline continues to have swelling    Time 4   Period Weeks   Status On-going     PT SHORT TERM GOAL #4   Title Patient will be independent with initial HEP    Time 4   Period Weeks   Status On-going           PT Long Term Goals - 05/01/16 1327      PT LONG TERM GOAL #1   Title Patient will stand for 1 hour without increased pain inorder to go shopping    Time 8   Period Weeks   Status New     PT LONG TERM GOAL #2   Title Patient will go up/down 8 steps without increased pain to improve community mobility.   Time 8   Period Weeks   Status New     PT LONG TERM GOAL #3   Title Patient will return to the gym without increased pain  and swelling    Time 8   Period Weeks   Status New     PT LONG TERM GOAL #4   Title Patient will demsotrate  a 36% deficit on FOTO    Time 8   Period Weeks   Status New               Plan - 05/16/16 1412    Clinical Impression Statement Shawndell reports 7/10 pain today due to walking despite her report of feeling better. Focused on manual to calm down the ankle pain and mobs to to improve mobility. She was able to do seated/ standing exericses well requiring verbal cues for rocker board / toe raise exericse to avoid using the hip/ knee to move the ankle.  she declined modalities post session.    PT Next Visit Plan ankle strengthening, CKC activities, begin light balance, manual/ mobs PRN,    PT Home Exercise Plan self DF stretch, ankle pumps, ankle EV/ IV ( IV.EV isometrics) standing weight shift- no shoes/ no heels, standing heel/ toe raise   Consulted and Agree with Plan of Care Patient      Patient will benefit from skilled therapeutic intervention in order to improve the following deficits and impairments:  Decreased activity tolerance, Decreased mobility, Decreased strength, Pain, Decreased range of motion, Abnormal gait, Difficulty walking  Visit Diagnosis: Stiffness of left ankle, not elsewhere classified  Difficulty in walking, not elsewhere classified  Pain in left ankle and joints of left foot  Localized edema  Muscle weakness (generalized)     Problem List Patient Active Problem List   Diagnosis Date Noted  . Dyspnea 10/25/2013  . Essential hypertension 05/18/2010  . ABDOMINAL PAIN RIGHT LOWER QUADRANT 05/18/2010  . PERSONAL HISTORY OF COLONIC POLYPS 05/18/2010   Starr Lake PT, DPT, LAT, ATC  05/16/16  2:17 PM      [place] [facility] [address] [place], [place], [address] Phone: [phone]   Fax:  [phone]  Name: Lauren Holloway MRN: [id number] Date  of Birth: 1931-07-13

## 2016-05-21 ENCOUNTER — Ambulatory Visit: Payer: Medicare Other | Admitting: Physical Therapy

## 2016-05-23 ENCOUNTER — Ambulatory Visit: Payer: Medicare Other | Admitting: Physical Therapy

## 2016-05-23 ENCOUNTER — Encounter: Payer: Self-pay | Admitting: Physical Therapy

## 2016-05-23 DIAGNOSIS — M6281 Muscle weakness (generalized): Secondary | ICD-10-CM

## 2016-05-23 DIAGNOSIS — M25672 Stiffness of left ankle, not elsewhere classified: Secondary | ICD-10-CM | POA: Diagnosis not present

## 2016-05-23 DIAGNOSIS — M25572 Pain in left ankle and joints of left foot: Secondary | ICD-10-CM

## 2016-05-23 DIAGNOSIS — R600 Localized edema: Secondary | ICD-10-CM

## 2016-05-23 DIAGNOSIS — R262 Difficulty in walking, not elsewhere classified: Secondary | ICD-10-CM

## 2016-05-23 NOTE — Patient Instructions (Signed)
Remove tape if irritatring

## 2016-05-23 NOTE — Therapy (Signed)
[facility] [address] [place], [place], [address] Phone: [phone]   Fax:  [phone]  Physical Therapy Treatment  Patient Details  Name: Lauren Holloway MRN: [id number] Date of Birth: 02/06/1932 Referring Provider: Dr Mark Yates   Encounter Date: 05/23/2016      PT End of Session - 05/23/16 1611    Visit Number 5   Number of Visits 16   Date for PT Re-Evaluation 06/26/16   PT Start Time 1330   PT Stop Time 1415   PT Time Calculation (min) 45 min   Activity Tolerance Patient tolerated treatment well   Behavior During Therapy [facility]FL for tasks assessed/performed      Past Medical History:  Diagnosis Date  . Cataract   . Hyperlipidemia   . Hypertension     Past Surgical History:  Procedure Laterality Date  . ABDOMINAL HYSTERECTOMY    . APPENDECTOMY    . BILATERAL OOPHORECTOMY    . CATARACT EXTRACTION, BILATERAL Bilateral 1996    There were no vitals filed for this visit.      Subjective Assessment - 05/23/16 1607    Multiple Pain Sites No                         [facility] Adult PT Treatment/Exercise - 05/23/16 0001      Manual Therapy   Manual Therapy Joint mobilization  retrograde soft tissue / edema contrtol   Joint Mobilization A/P mobilization with movement.  ,  taping 1 Fan for edema,  2 I bands for ligament correction     Ankle Exercises: Stretches   Other Stretch pro stretch 5 minutes  DF/PF     Ankle Exercises: Standing   SLS needs hands  on pad and on floor`   Heel Raises 10 reps   Toe Raise 10 reps  on foam and off foam   Other Standing Ankle Exercises 4 inch step ups ,  heavy cues.      Ankle Exercises: Seated   Ankle Circles/Pumps 10 reps   Other Seated Ankle Exercises AROM 5 reps                  PT Short Term Goals - 05/23/16 1612      PT SHORT TERM GOAL #1   Title Patient will improve passive left dorsiflexion to neutral    Baseline to nuetral with  stretching    Time 4   Period Weeks   Status Achieved     PT SHORT TERM GOAL #2   Title Patient will report no pain with active EV/IV    Baseline continues to be painful   Time 4   Period Weeks   Status On-going     PT SHORT TERM GOAL #3   Title Patients figure 8 swelling on the left will be within 1 cm of the right    Baseline continues to have swelling not measured.   Time 4   Period Weeks   Status On-going     PT SHORT TERM GOAL #4   Title Patient will be independent with initial HEP    Time 4   Period Weeks   Status Unable to assess           PT Long Term Goals - 05/01/16 1327      PT LONG TERM GOAL #1   Title Patient will stand for 1 hour without increased pain inorder to go shopping      Time 8   Period Weeks   Status New     PT LONG TERM GOAL #2   Title Patient will go up/down 8 steps without increased pain to improve community mobility.   Time 8   Period Weeks   Status New     PT LONG TERM GOAL #3   Title Patient will return to the gym without increased pain and swelling    Time 8   Period Weeks   Status New     PT LONG TERM GOAL #4   Title Patient will demsotrate  a 36% deficit on FOTO    Time 8   Period Weeks   Status New               Plan - 05/23/16 1611    Clinical Impression Statement No pain at end of session.  manual ankle joint helpful.  STG#1 met.   PT Next Visit Plan ankle strengthening, CKC activities, begin light balance, manual/ mobs PRN, Check tape Measure edema   PT Home Exercise Plan self DF stretch, ankle pumps, ankle EV/ IV ( IV.EV isometrics) standing weight shift- no shoes/ no heels, standing heel/ toe raise   Consulted and Agree with Plan of Care Patient      Patient will benefit from skilled therapeutic intervention in order to improve the following deficits and impairments:  Decreased activity tolerance, Decreased mobility, Decreased strength, Pain, Decreased range of motion, Abnormal gait, Difficulty  walking  Visit Diagnosis: Stiffness of left ankle, not elsewhere classified  Difficulty in walking, not elsewhere classified  Pain in left ankle and joints of left foot  Localized edema  Muscle weakness (generalized)     Problem List Patient Active Problem List   Diagnosis Date Noted  . Dyspnea 10/25/2013  . Essential hypertension 05/18/2010  . ABDOMINAL PAIN RIGHT LOWER QUADRANT 05/18/2010  . PERSONAL HISTORY OF COLONIC POLYPS 05/18/2010    Travius Crochet PTA 05/23/2016, 4:14 PM  [facility] [address] [place], [place], [address] Phone: [phone]   Fax:  [phone]  Name: HOORIA GASPARINI MRN: [id number] Date of Birth: August 21, 1931

## 2016-05-27 ENCOUNTER — Ambulatory Visit: Payer: Medicare Other | Admitting: Physical Therapy

## 2016-05-28 ENCOUNTER — Encounter (INDEPENDENT_AMBULATORY_CARE_PROVIDER_SITE_OTHER): Payer: Self-pay | Admitting: Orthopaedic Surgery

## 2016-05-28 ENCOUNTER — Ambulatory Visit (INDEPENDENT_AMBULATORY_CARE_PROVIDER_SITE_OTHER): Payer: Medicare Other | Admitting: Orthopaedic Surgery

## 2016-05-28 VITALS — BP 136/72 | HR 57 | Ht 63.0 in | Wt 165.0 lb

## 2016-05-28 DIAGNOSIS — S93409D Sprain of unspecified ligament of unspecified ankle, subsequent encounter: Secondary | ICD-10-CM | POA: Diagnosis not present

## 2016-05-28 DIAGNOSIS — IMO0001 Reserved for inherently not codable concepts without codable children: Secondary | ICD-10-CM

## 2016-05-28 NOTE — Progress Notes (Addendum)
Office Visit Note   Patient: Lauren Holloway           Date of Birth: [birth year]           MRN: [id number] Visit Date: 05/28/2016              Requested by: [name], MD [address] [place], [place] [address] PCP: Jerlyn Ly, MD   Assessment & Plan Visit Diagnoses:  1. Grade 2 ankle sprain, unspecified laterality, subsequent encounter     Plan: Asian gotten some improvement with therapy. Swelling is down some but she still has anterior ankle tenderness and lateral ankle tenderness with some residual swelling. She was in her boot original injury back in December. She has had 2 sets of x-rays which were negative for fracture. Will check her back in one month if she still having continued symptoms we'll need to consider an MRI scan of the left ankle. With the patient slow progression may have an osteochondral lesion that is not responding to time rest and elevation and therapy. Recheck 1 month.  Follow-Up Instructions: Return in about 1 month (around 06/28/2016).   Orders:  No orders of the defined types were placed in this encounter.  No orders of the defined types were placed in this encounter.     Procedures: No procedures performed   Clinical Data: No additional findings.   Subjective: Chief Complaint  Patient presents with  . Left Ankle - Pain    Patient returns for four week follow up. She is status post Grade 2 Lateral Ankle Sprain on the left. She has been attending physical therapy which is going well. She continues to be a little swollen. She is not having to take medication for pain.   Therapy progress reviewed exercises were reviewed as well as gradual improvement in her symptoms.  Review of Systems 10 point review of systems updated and is unchanged from last office visit last month.   Objective: Vital Signs: BP 136/72   Pulse (!) 57   Ht 5\' 3"  (1.6 m)   Wt 165 lb (74.8 kg)   BMI 29.23 kg/m   Physical Exam  Constitutional: She is oriented to  person, place, and time. She appears well-developed.  HENT:  Head: Normocephalic.  Right Ear: External ear normal.  Left Ear: External ear normal.  Eyes: Pupils are equal, round, and reactive to light.  Neck: No tracheal deviation present. No thyromegaly present.  Cardiovascular: Normal rate.   Pulmonary/Chest: Effort normal.  Abdominal: Soft.  Musculoskeletal:  Patient still has anterolateral tenderness over the left ankle. No swelling of the opposite right ankle pulses are normal toe extensors peroneal posterior tibial is normal. Short extensors are normal. Anterior talofibular still causes some tenderness with palpation. Grade 1 anterior drawer with good endpoint. No rash or expose skin no plantar foot lesions pulses are normal.  Neurological: She is alert and oriented to person, place, and time.  Skin: Skin is warm and dry.  Psychiatric: She has a normal mood and affect. Her behavior is normal.    Ortho Exam  Specialty Comments:  No specialty comments available.  Imaging: No results found.   PMFS History: Patient Active Problem List   Diagnosis Date Noted  . Grade 2 ankle sprain, unspecified laterality, subsequent encounter 05/28/2016  . Dyspnea 10/25/2013  . Essential hypertension 05/18/2010  . ABDOMINAL PAIN RIGHT LOWER QUADRANT 05/18/2010  . PERSONAL HISTORY OF COLONIC POLYPS 05/18/2010   Past Medical History:  Diagnosis Date  .  Cataract   . Hyperlipidemia   . Hypertension     Family History  Problem Relation Age of Onset  . Ovarian cancer Mother   . Hypertension Mother   . Hypertension Father   . Heart disease Father   . Hypertension Sister   . Hypertension Sister     Past Surgical History:  Procedure Laterality Date  . ABDOMINAL HYSTERECTOMY    . APPENDECTOMY    . BILATERAL OOPHORECTOMY    . CATARACT EXTRACTION, BILATERAL Bilateral 1996   Social History   Occupational History  . Not on file.   Social History Main Topics  . Smoking status: Never  Smoker  . Smokeless tobacco: Never Used  . Alcohol use Yes     Comment: MIX OCCASIONALLY  . Drug use: No  . Sexual activity: Not on file

## 2016-05-29 ENCOUNTER — Ambulatory Visit: Payer: Medicare Other | Admitting: Physical Therapy

## 2016-06-03 ENCOUNTER — Other Ambulatory Visit: Payer: Self-pay | Admitting: Gastroenterology

## 2016-06-03 NOTE — Telephone Encounter (Signed)
Pt is requesting refill of Pylera and Omeprazole. Does she need to submit another stool sample prior to refilling Pylera?

## 2016-06-03 NOTE — Telephone Encounter (Signed)
Lauren Holloway, This patient was given a 10 day course of Pylera after her last endoscopy. It is not a long term medication. If she has yet to complete the 10 day course we can give her that, but that is all. She should have already completed the therapy though, and if that is the case she should have a follow up H pylori stool antigen to confirm eradication. I don't see she has submitted this yet. She needs to be off her prilosec for at least 2 weeks prior to submitting. Otherwise we can refill prilosec to use if she takes it for reflux symptoms. Can you please confirm with me that you got this message, Thanks

## 2016-06-04 NOTE — Telephone Encounter (Signed)
Left message for pt to return call.

## 2016-06-07 ENCOUNTER — Ambulatory Visit: Payer: Medicare Other | Admitting: Physical Therapy

## 2016-06-11 ENCOUNTER — Encounter: Payer: Self-pay | Admitting: Physical Therapy

## 2016-06-11 ENCOUNTER — Ambulatory Visit: Payer: Medicare Other | Attending: Orthopaedic Surgery | Admitting: Physical Therapy

## 2016-06-11 DIAGNOSIS — M25572 Pain in left ankle and joints of left foot: Secondary | ICD-10-CM | POA: Insufficient documentation

## 2016-06-11 DIAGNOSIS — R262 Difficulty in walking, not elsewhere classified: Secondary | ICD-10-CM | POA: Diagnosis present

## 2016-06-11 DIAGNOSIS — M25672 Stiffness of left ankle, not elsewhere classified: Secondary | ICD-10-CM | POA: Insufficient documentation

## 2016-06-11 DIAGNOSIS — M6281 Muscle weakness (generalized): Secondary | ICD-10-CM

## 2016-06-11 DIAGNOSIS — R600 Localized edema: Secondary | ICD-10-CM | POA: Insufficient documentation

## 2016-06-11 NOTE — Therapy (Signed)
[place], [place], [address] Phone: [phone]   Fax:  [phone]  Physical Therapy Treatment/ Discharge  Patient Details  Name: Lauren Holloway MRN: [id number] Date of Birth: 1931-07-05 Referring Provider: Dr Rodell Perna   Encounter Date: 06/11/2016      PT End of Session - 06/11/16 1610    Visit Number 6   Number of Visits 16   Date for PT Re-Evaluation 06/26/16   Authorization Type Medicare    PT Start Time 1330   PT Stop Time 1410   PT Time Calculation (min) 40 min   Activity Tolerance Patient tolerated treatment well   Behavior During Therapy [facility] for tasks assessed/performed      Past Medical History:  Diagnosis Date  . Cataract   . Hyperlipidemia   . Hypertension     Past Surgical History:  Procedure Laterality Date  . ABDOMINAL HYSTERECTOMY    . APPENDECTOMY    . BILATERAL OOPHORECTOMY    . CATARACT EXTRACTION, BILATERAL Bilateral 1996    There were no vitals filed for this visit.      Subjective Assessment - 06/11/16 1605    Subjective Patient has been to the MD. He wants her to continue with her exercises. She feels like she can continue on her own. She continues to have some pain in her anteriro and lateral ankjle but it is much better. She reports she has been wearing some shoes that may be causing some swellling. She hopes to return to the gym soon.    Limitations Standing;Walking   How long can you sit comfortably? No limit    How long can you stand comfortably? < 20 min    How long can you walk comfortably? limited community distances without increased pain    Diagnostic tests X-ray: (-) for fracture    Patient Stated Goals To return to walking    Currently in Pain? No/denies            [facility] PT Assessment - 06/11/16 0001      Figure 8 Edema   Figure 8 - Right  48.5   Figure 8 - Left  50.5      AROM   Left Ankle Dorsiflexion 0   Left Ankle Plantar Flexion 35    Left Ankle Inversion 20   Left Ankle Eversion 13     PROM   Right Ankle Dorsiflexion 3   Right Ankle Plantar Flexion 40   Left Ankle Dorsiflexion 0   Left Ankle Inversion 14   Left Ankle Eversion 10     Strength   Left Ankle Dorsiflexion 5/5   Left Ankle Inversion 5/5   Left Ankle Eversion 5/5     Palpation   Palpation comment continues to have tenderness to palaption in the lateral ankle                              PT Education - 06/11/16 1608    Education provided Yes   Education Details continue with HEP; updated HEP for standing and weight shifting    Person(s) Educated Patient   Methods Explanation;Demonstration;Verbal cues;Handout   Comprehension Verbalized understanding;Returned demonstration;Verbal cues required          PT Short Term Goals - 06/11/16 1343      PT SHORT TERM GOAL #1   Title Patient will improve passive left dorsiflexion to neutral    Baseline  to nuetral with stretching    Time 4   Period Weeks   Status Achieved     PT SHORT TERM GOAL #2   Title Patient will report no pain with active EV/IV    Baseline only mild intermittent pain    Time 4   Period Weeks   Status Achieved     PT SHORT TERM GOAL #3   Title Patients figure 8 swelling on the left will be within 1 cm of the right    Baseline 1 cm down    Time 4   Period Weeks   Status Achieved     PT SHORT TERM GOAL #4   Title Patient will be independent with initial HEP    Time 4   Period Weeks           PT Long Term Goals - 06/11/16 1345      PT LONG TERM GOAL #1   Title Patient will stand for 1 hour without increased pain inorder to go shopping    Baseline Patient reports no difficulty shopping.    Time 8   Period Weeks   Status Achieved     PT LONG TERM GOAL #2   Title Patient will go up/down 8 steps without increased pain to improve community mobility.   Baseline demsotrated no pain with steps   Time 8   Period Weeks     PT LONG TERM GOAL  #3   Title Patient will return to the gym without increased pain and swelling    Baseline Patient has not been back to the gym but it is because of personel matters but feels comfortable with all her exercises    Time 8   Period Weeks   Status Achieved     PT LONG TERM GOAL #4   Title Patient will demsotrate  a 36% deficit on FOTO    Baseline 28% deficit    Time 8   Period Weeks   Status Achieved               Plan - 06/11/16 1611    Clinical Impression Statement Patient has progressed well. She still has limitations in motion. Her strength has progresed very well. She feels like she can continue working on her stretches at home. She was given standing and weight bearing exercises. She was strongly encourged to keep working on range of motion and strengthening at home. D/C to HEP at this time.,     Rehab Potential Good   PT Frequency 2x / week   PT Duration 8 weeks   PT Treatment/Interventions ADLs/Self Care Home Management;Cryotherapy;Electrical Stimulation;Iontophoresis 77m/ml Dexamethasone;Moist Heat;Ultrasound;Stair training;Gait training;Patient/family education;Neuromuscular re-education;Therapeutic exercise;Therapeutic activities;Manual techniques;Passive range of motion;Taping   PT Next Visit Plan ankle strengthening, CKC activities, begin light balance, manual/ mobs PRN, Check tape Measure edema   PT Home Exercise Plan self DF stretch, ankle pumps, ankle EV/ IV ( IV.EV isometrics) standing weight shift- no shoes/ no heels, standing heel/ toe raise   Consulted and Agree with Plan of Care Patient      Patient will benefit from skilled therapeutic intervention in order to improve the following deficits and impairments:  Decreased activity tolerance, Decreased mobility, Decreased strength, Pain, Decreased range of motion, Abnormal gait, Difficulty walking  Visit Diagnosis: Stiffness of left ankle, not elsewhere classified  Difficulty in walking, not elsewhere  classified  Pain in left ankle and joints of left foot  Localized edema  Muscle weakness (generalized)  PHYSICAL THERAPY DISCHARGE  SUMMARY  Visits from Start of Care: 6  Current functional level related to goals / functional outcomes: Decreased pain with all activity   Remaining deficits:  Swelling and ROM. Patient encouraged to continue with exercises at home Education / Equipment: HEP  Plan: Patient agrees to discharge.  Patient goals were not met. Patient is being discharged due to meeting the stated rehab goals.  ?????       Problem List Patient Active Problem List   Diagnosis Date Noted  . Grade 2 ankle sprain, unspecified laterality, subsequent encounter 05/28/2016  . Dyspnea 10/25/2013  . Essential hypertension 05/18/2010  . ABDOMINAL PAIN RIGHT LOWER QUADRANT 05/18/2010  . PERSONAL HISTORY OF COLONIC POLYPS 05/18/2010    Carney Living PT DPT  06/11/2016, 4:35 PM  [facility] [address] [place], [place], [address] Phone: [phone]   Fax:  [phone]  Name: Lauren Holloway MRN: [id number] Date of Birth: 1931/10/05

## 2016-06-12 ENCOUNTER — Other Ambulatory Visit: Payer: Self-pay

## 2016-06-12 MED ORDER — OMEPRAZOLE 40 MG PO CPDR: 40.0000 mg | DELAYED_RELEASE_CAPSULE | Freq: Two times a day (BID) | ORAL | 0 refills | Status: DC

## 2016-06-12 NOTE — Telephone Encounter (Signed)
I have had no response from pt. I refilled the Omeprazole and sent a note to pharmacy that Dr. Brooks Sailors denying Pylera at this time and that pt needed to call the office to discuss.

## 2016-06-17 NOTE — Telephone Encounter (Signed)
Hi Lauren Holloway if you still can't get ahold of her please send a letter stating the Pylera was for one time use only and to call back the clinic for further instructions. If she has not take Pylera yet then she should do so, but I don't think this is the case. Thanks

## 2016-06-26 NOTE — Telephone Encounter (Signed)
Letter mailed to pt. Instructed to contact us if she needed a refill of Pylera.

## 2016-07-02 ENCOUNTER — Ambulatory Visit (INDEPENDENT_AMBULATORY_CARE_PROVIDER_SITE_OTHER): Payer: Medicare Other | Admitting: Orthopaedic Surgery

## 2016-07-25 ENCOUNTER — Encounter: Payer: Self-pay | Admitting: Cardiovascular Disease

## 2016-08-09 NOTE — Progress Notes (Signed)
Patient ID: Lauren Holloway, female   DOB: 02/07/1932, 83 y.o.   MRN: [id number]   83 y.o.  [employer] graduate  Initially referred by Dr Joylene Draft in 2013/06/13  for dyspnea     His notes indicate echo in June 14, 2011 with mild pulmonary HTN Record not available  Some asthma uses ventolin and trial of Qvair Non smoker CXR at his office reported normal and review shows normal CXR 06/14/2011 She gets her dyspnea always with wheezing. Mostly when outside and tending her roses. She goes to gym 2-3 x/ week and does not Have as much dyspnea or wheezing. No chest pain. No history of murmur, CAD, anemia or thyroid disease. 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  All other systems reviewed and negative  General: BP (!) 154/70 (BP Location: Right Arm)   Pulse (!) 55   Ht 5\' 4"  (1.626 m)   Wt 74.8 kg (164 lb 12.8 oz)   BMI 28.29 kg/m  Affect appropriate Healthy:  appears stated age HEENT: normal Neck supple with no adenopathy JVP normal no bruits no thyromegaly Lungs clear with no wheezing and good diaphragmatic motion Heart:  S1/S2 no murmur, no rub, gallop or click PMI normal Abdomen: benighn, BS positve, no tenderness, no AAA no bruit.  No HSM or HJR Distal pulses intact with no bruits No edema Neuro non-focal Skin  warm and dry No muscular weakness    Current Outpatient Prescriptions  Medication Sig Dispense Refill  . ALPRAZolam (XANAX) 0.5 MG tablet Take 0.25 mg by mouth 2 (two) times daily as needed for anxiety or sleep.  3  . amLODipine (NORVASC) 5 MG tablet Take 5 mg by mouth daily.      Marland Kitchen aspirin 81 MG tablet Take 81 mg by mouth daily.    Marland Kitchen escitalopram (LEXAPRO) 5 MG tablet Take 5 mg by mouth daily.    . Fish Oil-Cholecalciferol (FISH OIL + D3 PO) Take 1 tablet by mouth daily.     Marland Kitchen ibuprofen (ADVIL,MOTRIN) 400 MG tablet Take 400 mg by mouth every 6 (six) hours as needed for headache, mild pain or moderate pain.    Marland Kitchen lisinopril (PRINIVIL,ZESTRIL) 10 MG tablet Take 10 mg by mouth daily.    Marland Kitchen lisinopril-hydrochlorothiazide (PRINZIDE,ZESTORETIC) 20-25 MG per tablet Take 1 tablet by mouth daily.    . metoprolol tartrate (LOPRESSOR) 25 MG tablet Take 25 mg by mouth 2 (two) times daily.     . Multiple Vitamins-Minerals (MULTIVITAMIN WITH MINERALS) tablet Take 1 tablet by mouth daily.    . rosuvastatin (CRESTOR) 5 MG tablet Take 5 mg by mouth daily.    . VENTOLIN HFA 108 (90 BASE) MCG/ACT inhaler Inhale 1 puff into the lungs 2 (two) times daily.  Current Facility-Administered Medications  Medication Dose Route Frequency Provider Last Rate Last Dose  . 0.9 %  sodium chloride infusion  500 mL Intravenous Continuous Armbruster, Renelda Loma, MD        Allergies  Sulfonamide derivatives  Electrocardiogram:   01/25/14  SR rate 58 nonspecific ST T wave change 8/15  07/26/15  SR rate 61 RBBB LAFB  08/13/16  SR rate 55 LAD LVH RBBB gone   Assessment and Plan Dyspnea:  Non cardiac exam normal echo normal stable HTN:  Well controlled.  Continue current medications and low sodium Dash type diet.   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

## 2016-08-13 ENCOUNTER — Encounter: Payer: Self-pay | Admitting: Cardiovascular Disease

## 2016-08-13 ENCOUNTER — Ambulatory Visit (INDEPENDENT_AMBULATORY_CARE_PROVIDER_SITE_OTHER): Payer: Medicare Other | Admitting: Cardiovascular Disease

## 2016-08-13 VITALS — BP 154/70 | HR 55 | Ht 64.0 in | Wt 164.8 lb

## 2016-08-13 DIAGNOSIS — I10 Essential (primary) hypertension: Secondary | ICD-10-CM

## 2016-08-13 NOTE — Patient Instructions (Addendum)

## 2017-03-06 ENCOUNTER — Other Ambulatory Visit: Payer: Self-pay

## 2017-03-06 ENCOUNTER — Telehealth: Payer: Self-pay

## 2017-03-06 DIAGNOSIS — A048 Other specified bacterial intestinal infections: Secondary | ICD-10-CM

## 2017-03-06 NOTE — Telephone Encounter (Signed)
Mailed letter to patient re: follow up testing for h pylori.

## 2017-03-06 NOTE — Telephone Encounter (Signed)
-----   Message from Yetta Flock, MD sent at 03/05/2017 12:36 PM EST ----- Almyra Free I got a message this patient never went to the lab for H pylori eradication testing. Can you see if she is willing to go to the lab and submit stool for H pylori? Can you help order the exam again. Thanks  ----- Message ----- From: SYSTEM Sent: 03/05/2017  12:05 AM To: Yetta Flock, MD

## 2017-03-17 ENCOUNTER — Ambulatory Visit (INDEPENDENT_AMBULATORY_CARE_PROVIDER_SITE_OTHER): Payer: Medicare Other | Admitting: Orthopaedic Surgery

## 2017-03-17 ENCOUNTER — Encounter (INDEPENDENT_AMBULATORY_CARE_PROVIDER_SITE_OTHER): Payer: Self-pay | Admitting: Orthopaedic Surgery

## 2017-03-17 ENCOUNTER — Ambulatory Visit (INDEPENDENT_AMBULATORY_CARE_PROVIDER_SITE_OTHER): Payer: Medicare Other

## 2017-03-17 DIAGNOSIS — G8929 Other chronic pain: Secondary | ICD-10-CM

## 2017-03-17 DIAGNOSIS — M25511 Pain in right shoulder: Secondary | ICD-10-CM

## 2017-03-17 DIAGNOSIS — M25512 Pain in left shoulder: Secondary | ICD-10-CM

## 2017-03-17 MED ORDER — BUPIVACAINE HCL 0.25 % IJ SOLN
2.0000 mL | INTRAMUSCULAR | Status: AC | PRN
  Administered 2017-03-17: 2 mL via INTRA_ARTICULAR

## 2017-03-17 MED ORDER — METHYLPREDNISOLONE ACETATE 40 MG/ML IJ SUSP
40.0000 mg | INTRAMUSCULAR | Status: AC | PRN
  Administered 2017-03-17: 40 mg via INTRA_ARTICULAR

## 2017-03-17 MED ORDER — LIDOCAINE HCL 2 % IJ SOLN
2.0000 mL | INTRAMUSCULAR | Status: AC | PRN
  Administered 2017-03-17: 2 mL

## 2017-03-17 NOTE — Progress Notes (Signed)
Office Visit Note   Patient: Lauren Holloway           Date of Birth: October 23, 1931           MRN: [id number] Visit Date: 03/17/2017              Requested by: [name], MD [address] [place], [place] [address] PCP: [name], MD   Assessment & Plan: Visit Diagnoses:  1. Chronic right shoulder pain     Plan: Impression is right shoulder rotator cuff tendinitis versus cervical spine radiculopathy.  At this point feel it is appropriate to proceed with a diagnostic/therapeutic cortisone injection to the right subacromial space.  She is to pay special attention to the next few hours while the Marcaine is in place.  If she is significantly better with Marcaine but this does not last very long, we will get an MRI of her right shoulder.  If she has no relief with the injection even with Marcaine, we will likely refer her for repeat epidural steroid injections.  She will call and let us know in the next 2 weeks if she is not any better.  Follow-Up Instructions: Return if symptoms worsen or fail to improve.   Orders:  Orders Placed This Encounter  Procedures  . Large Joint Inj: R subacromial bursa  . XR Shoulder Right   No orders of the defined types were placed in this encounter.     Procedures: Large Joint Inj: R subacromial bursa on 03/17/2017 1:28 PM Indications: pain Details: 22 G needle Medications: 2 mL lidocaine 2 %; 2 mL bupivacaine 0.25 %; 40 mg methylPREDNISolone acetate 40 MG/ML Outcome: tolerated well, no immediate complications Patient was prepped and draped in the usual sterile fashion.       Clinical Data: No additional findings.   Subjective: Chief Complaint  Patient presents with  . Right Shoulder - Pain    HPI Lauren Holloway is a pleasant 84 year old female who presents to clinic today with right shoulder pain.  She was initially seen for left shoulder pain back in 2011 followed by right shoulder pain back in 2013 both by Dr. Marlou Sa.  In 2011 she had a  subacromial injection on the left which was of no help.  She then had an MRI of her cervical spine.  Based on the MRI findings she was referred for epidural steroid injections.  This significantly helped her left shoulder pain.  In regards to the right shoulder, she states that she has never had this injected.  The pain she has is located to the top of her shoulder and into the deltoid.  She describes as a constant ache worse with forward flexion abduction and internal rotation.  No weakness.  She is tried Tylenol with minimal relief of symptoms.  No numbness tingling burning to the right arm or hand.  Review of Systems as detailed in HPI all others reviewed are negative.   Objective: Vital Signs: There were no vitals taken for this visit.  Physical Exam well-developed well-nourished female in no acute distress.  Alert and oriented x3.  Ortho Exam examination of the right shoulder reveals full forward flexion and abduction.  She can internally rotate to L5.  Markedly positive with empty can and cross body.  Negative drop arm.  Specialty Comments:  No specialty comments available.  Imaging: Xr Shoulder Right  Result Date: 03/17/2017 X-rays of the right shoulder reveal a type II acromion.  Mild AC/glenohumeral changes.  No superior migration of  the humeral head.    PMFS History: Patient Active Problem List   Diagnosis Date Noted  . Chronic right shoulder pain 03/17/2017  . Grade 2 ankle sprain, unspecified laterality, subsequent encounter 05/28/2016  . Dyspnea 10/25/2013  . Essential hypertension 05/18/2010  . ABDOMINAL PAIN RIGHT LOWER QUADRANT 05/18/2010  . PERSONAL HISTORY OF COLONIC POLYPS 05/18/2010   Past Medical History:  Diagnosis Date  . Cataract   . Hyperlipidemia   . Hypertension     Family History  Problem Relation Age of Onset  . Ovarian cancer Mother   . Hypertension Mother   . Hypertension Father   . Heart disease Father   . Hypertension Sister   .  Hypertension Sister     Past Surgical History:  Procedure Laterality Date  . ABDOMINAL HYSTERECTOMY    . APPENDECTOMY    . BILATERAL OOPHORECTOMY    . CATARACT EXTRACTION, BILATERAL Bilateral 1996   Social History   Occupational History  . Not on file  Tobacco Use  . Smoking status: Never Smoker  . Smokeless tobacco: Never Used  Substance and Sexual Activity  . Alcohol use: Yes    Comment: MIX OCCASIONALLY  . Drug use: No  . Sexual activity: Not on file

## 2017-05-20 ENCOUNTER — Ambulatory Visit (INDEPENDENT_AMBULATORY_CARE_PROVIDER_SITE_OTHER): Payer: Medicare Other | Admitting: Orthopaedic Surgery

## 2017-08-21 NOTE — Progress Notes (Signed)
Patient ID: Lauren Holloway, female   DOB: August 25, 1931, 84 y.o.   MRN: [id number]   84 y.o.  [employer] graduate  Initially referred by Dr Joylene Draft in 2013/06/04  for dyspnea  She is a non smoker thought to be Some reactive airway disease. Improved with Qvar. 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  All other systems reviewed and negative  General: BP 140/76   Pulse 86   Ht 5\' 4"  (1.626 m)   Wt 171 lb 12 oz (77.9 kg)   SpO2 99%   BMI 29.48 kg/m  Affect appropriate Healthy:  appears stated age 23: erythema left eye  Neck supple with no adenopathy JVP normal no bruits no thyromegaly Lungs clear with no wheezing and good diaphragmatic motion Heart:  S1/S2 no murmur, no rub, gallop or click PMI normal Abdomen: benighn, BS positve, no tenderness, no AAA no bruit.  No HSM or HJR Distal pulses intact with no bruits No edema Neuro non-focal Skin warm and dry No muscular weakness    Current Outpatient Medications  Medication Sig Dispense Refill  . ALPRAZolam (XANAX) 0.5 MG tablet Take 0.25 mg by mouth 2 (two) times daily as needed for anxiety or sleep.  3  . amLODipine (NORVASC) 5 MG tablet Take 5 mg by mouth daily.      Marland Kitchen aspirin 81 MG tablet Take 81 mg by mouth daily.    Marland Kitchen escitalopram (LEXAPRO) 5 MG tablet Take 5 mg by mouth daily.    . Fish Oil-Cholecalciferol (FISH OIL + D3 PO) Take 1 tablet by mouth daily.     Marland Kitchen ibuprofen  (ADVIL,MOTRIN) 400 MG tablet Take 400 mg by mouth every 6 (six) hours as needed for headache, mild pain or moderate pain.    Marland Kitchen lisinopril (PRINIVIL,ZESTRIL) 10 MG tablet Take 10 mg by mouth daily.    Marland Kitchen lisinopril-hydrochlorothiazide (PRINZIDE,ZESTORETIC) 20-25 MG per tablet Take 1 tablet by mouth daily.    . metoprolol tartrate (LOPRESSOR) 25 MG tablet Take 25 mg by mouth 2 (two) times daily.     . Multiple Vitamins-Minerals (MULTIVITAMIN WITH MINERALS) tablet Take 1 tablet by mouth daily.    . rosuvastatin (CRESTOR) 5 MG tablet Take 5 mg by mouth daily.    . VENTOLIN HFA 108 (90 BASE) MCG/ACT inhaler Inhale 1 puff into the lungs 2 (two) times daily.     Current Facility-Administered Medications  Medication Dose Route Frequency Provider Last Rate Last Dose  . 0.9 %  sodium chloride infusion  500 mL Intravenous Continuous Armbruster, Carlota Raspberry, MD        Allergies  Sulfonamide derivatives  Electrocardiogram:   SR rate 62 IVCD LBBB type   Assessment and Plan Dyspnea:  Non cardiac exam normal echo normal stable HTN:  Well controlled.  Continue current medications and low sodium Dash type diet.   Bifasicular Block:  2015-06-05 no high grade AV block  ECG today actually with resolved RBBB Anxiety: related to caring  for sister with dementia continue lexapro Eye;  Needs ilotycin or other medicated eye drop to see her eye doctor today   F/u with  me in a year  She will get routine blood work with Dr Selinda Orion

## 2017-08-30 ENCOUNTER — Emergency Department (HOSPITAL_COMMUNITY)
Admission: EM | Admit: 2017-08-30 | Discharge: 2017-08-30 | Disposition: A | Discharge status: Home / Self Care | Payer: Medicare Other | Attending: Emergency Medicine | Admitting: Emergency Medicine

## 2017-08-30 ENCOUNTER — Other Ambulatory Visit: Payer: Self-pay

## 2017-08-30 ENCOUNTER — Encounter (HOSPITAL_COMMUNITY): Payer: Self-pay | Admitting: Emergency Medicine

## 2017-08-30 DIAGNOSIS — H5712 Ocular pain, left eye: Secondary | ICD-10-CM

## 2017-08-30 DIAGNOSIS — Z79899 Other long term (current) drug therapy: Secondary | ICD-10-CM | POA: Insufficient documentation

## 2017-08-30 DIAGNOSIS — Z7982 Long term (current) use of aspirin: Secondary | ICD-10-CM | POA: Diagnosis not present

## 2017-08-30 DIAGNOSIS — I10 Essential (primary) hypertension: Secondary | ICD-10-CM | POA: Diagnosis not present

## 2017-08-30 MED ORDER — TETRACAINE HCL 0.5 % OP SOLN
2.0000 [drp] | Freq: Once | OPHTHALMIC | Status: AC
  Administered 2017-08-30: 2 [drp] via OPHTHALMIC
  Filled 2017-08-30: qty 4

## 2017-08-30 MED ORDER — FLUORESCEIN SODIUM 1 MG OP STRP
1.0000 | ORAL_STRIP | Freq: Once | OPHTHALMIC | Status: AC
  Administered 2017-08-30: 1 via OPHTHALMIC
  Filled 2017-08-30: qty 1

## 2017-08-30 NOTE — ED Provider Notes (Signed)
[facility] DEPART[facility] Provider Note   CSN: [id number] Arrival date & time: 08/30/17  1607     History   Chief Complaint Chief Complaint  Patient presents with  . Eye Pain    HPI Lauren Holloway is a 84 y.o. female.  HPI Patient states she began having left eye pain last night and then woke this morning with crusting of the left eye, worsening pain and redness.  Notes photophobia.  No known trauma.  No fever or chills.  No visual changes.  Has had previous cataract surgery to both eyes.  Does not wear contact lenses.  Patient noted that she did have a posterior headache yesterday evening but denies headache currently.  No focal weakness or numbness. Past Medical History:  Diagnosis Date  . Cataract   . Hyperlipidemia   . Hypertension     Patient Active Problem List   Diagnosis Date Noted  . Chronic right shoulder pain 03/17/2017  . Grade 2 ankle sprain, unspecified laterality, subsequent encounter 05/28/2016  . Dyspnea 10/25/2013  . Essential hypertension 05/18/2010  . ABDOMINAL PAIN RIGHT LOWER QUADRANT 05/18/2010  . PERSONAL HISTORY OF COLONIC POLYPS 05/18/2010    Past Surgical History:  Procedure Laterality Date  . ABDOMINAL HYSTERECTOMY    . APPENDECTOMY    . BILATERAL OOPHORECTOMY    . CATARACT EXTRACTION, BILATERAL Bilateral 1996     OB History   None      Home Medications    Prior to Admission medications   Medication Sig Start Date End Date Taking? Authorizing Provider  ALPRAZolam Duanne Moron) 0.5 MG tablet Take 0.25 mg by mouth 2 (two) times daily as needed for anxiety or sleep. 06/17/16   [provider]  amLODipine (NORVASC) 5 MG tablet Take 5 mg by mouth daily.      [provider]  aspirin 81 MG tablet Take 81 mg by mouth daily.    [provider]  escitalopram (LEXAPRO) 5 MG tablet Take 5 mg by mouth daily.    [provider]  Fish Oil-Cholecalciferol (FISH OIL + D3 PO) Take 1 tablet by  mouth daily.     [provider]  ibuprofen (ADVIL,MOTRIN) 400 MG tablet Take 400 mg by mouth every 6 (six) hours as needed for headache, mild pain or moderate pain.    [provider]  lisinopril (PRINIVIL,ZESTRIL) 10 MG tablet Take 10 mg by mouth daily.    [provider]  lisinopril-hydrochlorothiazide (PRINZIDE,ZESTORETIC) 20-25 MG per tablet Take 1 tablet by mouth daily. 05/03/14   [provider]  metoprolol tartrate (LOPRESSOR) 25 MG tablet Take 25 mg by mouth 2 (two) times daily.  03/18/13   [provider]  Multiple Vitamins-Minerals (MULTIVITAMIN WITH MINERALS) tablet Take 1 tablet by mouth daily.    [provider]  rosuvastatin (CRESTOR) 5 MG tablet Take 5 mg by mouth daily. 06/07/15   [provider]  VENTOLIN HFA 108 (90 BASE) MCG/ACT inhaler Inhale 1 puff into the lungs 2 (two) times daily. 04/19/14   [provider]    Family History Family History  Problem Relation Age of Onset  . Ovarian cancer Mother   . Hypertension Mother   . Hypertension Father   . Heart disease Father   . Hypertension Sister   . Hypertension Sister     Social History Social History   Tobacco Use  . Smoking status: Never Smoker  . Smokeless tobacco: Never Used  Substance Use Topics  .  Alcohol use: Yes    Comment: MIX OCCASIONALLY  . Drug use: No     Allergies   Sulfonamide derivatives   Review of Systems Review of Systems  Constitutional: Negative for chills and unexpected weight change.  HENT: Negative for congestion, sinus pressure and sinus pain.   Eyes: Positive for photophobia, pain, discharge and redness. Negative for itching and visual disturbance.  Respiratory: Negative for cough, shortness of breath and wheezing.   Cardiovascular: Negative for chest pain.  Gastrointestinal: Negative for abdominal pain, constipation, diarrhea, nausea and vomiting.  Musculoskeletal: Negative for back pain, neck pain and neck  stiffness.  Skin: Negative for rash and wound.  Neurological: Positive for headaches. Negative for dizziness, weakness and light-headedness.  All other systems reviewed and are negative.    Physical Exam Updated Vital Signs BP (!) 161/66   Pulse 66   Temp 98 F (36.7 C) (Oral)   Resp 18   SpO2 97%   Physical Exam  Constitutional: She is oriented to person, place, and time. She appears well-developed and well-nourished. No distress.  HENT:  Head: Normocephalic and atraumatic.  Mouth/Throat: Oropharynx is clear and moist. No oropharyngeal exudate.  No temporal artery tenderness to palpation.  Eyes: Pupils are equal, round, and reactive to light. EOM are normal.  Scleral injection to the left eye.  No foreign bodies visualized.  No floor seen uptake.  Intraocular pressures measured at 23, 27 and 32 for the left eye.  Patient pupil is 3 mm and minimally reactive bilaterally.  Has direct and consensual photophobia.  Neck: Normal range of motion. Neck supple.  Cardiovascular: Normal rate.  Pulmonary/Chest: Effort normal.  Abdominal: Soft.  Musculoskeletal: Normal range of motion. She exhibits no edema or tenderness.  Lymphadenopathy:    She has no cervical adenopathy.  Neurological: She is alert and oriented to person, place, and time.  Moving all extremities without focal deficit.  Sensation intact.  Skin: Skin is warm and dry. Capillary refill takes less than 2 seconds. No rash noted. She is not diaphoretic. No erythema.  Psychiatric: She has a normal mood and affect. Her behavior is normal.  Nursing note and vitals reviewed.    ED Treatments / Results  Labs (all labs ordered are listed, but only abnormal results are displayed) Labs Reviewed - No data to display  EKG None  Radiology No results found.  Procedures Procedures (including critical care time)  Medications Ordered in ED Medications  tetracaine (PONTOCAINE) 0.5 % ophthalmic solution 2 drop (has no  administration in time range)  fluorescein ophthalmic strip 1 strip (has no administration in time range)     Initial Impression / Assessment and Plan / ED Course  I have reviewed the triage vital signs and the nursing notes.  Pertinent labs & imaging results that were available during my care of the patient were reviewed by me and considered in my medical decision making (see chart for details).     Discussed with ophthalmology on-call, Dr. Valetta Close.  Dr. Valetta Close had extensive conversation with patient regarding past medical ophthalmologic history.  Have advised natural to your ointment through the night and will see patient tomorrow at 1230 at his office.  He has the patient's mobile number.  Patient's been given return precautions and is voiced understanding.  Final Clinical Impressions(s) / ED Diagnoses   Final diagnoses:  Left eye pain    ED Discharge Orders    None       Julianne Rice, MD 08/30/17 1821

## 2017-08-30 NOTE — ED Triage Notes (Signed)
Pt presents to ED for assessment of left eye pain starting last night, worsening today.  Pain radiates from the back of the pt's head to behind the eye.  Some redness noted to eye.  Pt denies vision changes.  C/o worsening pain with light exposure.  States discharge this morning.  Pain with palpation.

## 2017-08-30 NOTE — Discharge Instructions (Addendum)
Apply natural tears ointment to the left eye.  Follow-up with Dr. Valetta Close as instructed.  Return for worsening symptoms, visual changes or for any concerns.

## 2017-09-01 ENCOUNTER — Ambulatory Visit: Payer: Medicare Other | Admitting: Cardiovascular Disease

## 2017-09-01 ENCOUNTER — Encounter: Payer: Self-pay | Admitting: Cardiovascular Disease

## 2017-09-01 VITALS — BP 140/76 | HR 86 | Ht 64.0 in | Wt 171.8 lb

## 2017-09-01 DIAGNOSIS — I10 Essential (primary) hypertension: Secondary | ICD-10-CM | POA: Diagnosis not present

## 2017-09-01 DIAGNOSIS — R0600 Dyspnea, unspecified: Secondary | ICD-10-CM

## 2017-09-01 NOTE — Patient Instructions (Addendum)

## 2017-10-03 ENCOUNTER — Other Ambulatory Visit: Payer: Self-pay | Admitting: Gastroenterology

## 2017-10-17 ENCOUNTER — Ambulatory Visit (INDEPENDENT_AMBULATORY_CARE_PROVIDER_SITE_OTHER): Payer: Medicare Other | Admitting: Orthopaedic Surgery

## 2017-10-17 ENCOUNTER — Encounter (INDEPENDENT_AMBULATORY_CARE_PROVIDER_SITE_OTHER): Payer: Self-pay | Admitting: Orthopaedic Surgery

## 2017-10-17 ENCOUNTER — Ambulatory Visit (INDEPENDENT_AMBULATORY_CARE_PROVIDER_SITE_OTHER): Payer: Self-pay

## 2017-10-17 DIAGNOSIS — M25551 Pain in right hip: Secondary | ICD-10-CM | POA: Diagnosis not present

## 2017-10-17 NOTE — Progress Notes (Signed)
Office Visit Note   Patient: Lauren Holloway           Date of Birth: [birth year]           MRN: [id number] Visit Date: 10/17/2017              Requested by: [name], MD [address] [place], [place] [address] PCP: [name], MD   Assessment & Plan: Visit Diagnoses:  1. Pain in right hip     Plan: Impression is right hip osteoarthritis.  At this point we will refer the patient to Dr. Ernestina Patches for a diagnostic and hopefully therapeutic interarticular cortisone injection.  She will follow-up with [place] in 8 weeks time for recheck.  She will come in sooner if she has no relief from the injection.  Call with concerns or questions in the meantime.  Follow-Up Instructions: Return in about 8 weeks (around 12/12/2017).   Orders:  Orders Placed This Encounter  Procedures  . XR HIP UNILAT W OR W/O PELVIS 2-3 VIEWS RIGHT   No orders of the defined types were placed in this encounter.     Procedures: No procedures performed   Clinical Data: No additional findings.   Subjective: Chief Complaint  Patient presents with  . Right Hip - Pain    HPI patient is a pleasant 84 year old female who presents to our clinic today with right hip pain.  This is been ongoing for the past few months and has progressively worsened.  All of her pain is to the groin and top of her thigh.  Worse when she has been on her feet for long period of time.  She has tried Tylenol with mild relief of symptoms.  No numbness, tingling or burning.  No previous hip pathology.  Review of Systems as detailed in HPI.  All others reviewed and are negative.   Objective: Vital Signs: There were no vitals taken for this visit.  Physical Exam well-developed well-nourished female no acute distress.  Alert and oriented x3  Ortho Exam examination of the right hip reveals minimally positive logroll.  Negative straight leg raise.  Specialty Comments:  No specialty comments available.  Imaging: Xr Hip Unilat W Or  W/o Pelvis 2-3 Views Right  Result Date: 10/17/2017 Minimal degenerative changes    PMFS History: Patient Active Problem List   Diagnosis Date Noted  . Pain in right hip 10/17/2017  . Chronic right shoulder pain 03/17/2017  . Grade 2 ankle sprain, unspecified laterality, subsequent encounter 05/28/2016  . Dyspnea 10/25/2013  . Essential hypertension 05/18/2010  . ABDOMINAL PAIN RIGHT LOWER QUADRANT 05/18/2010  . PERSONAL HISTORY OF COLONIC POLYPS 05/18/2010   Past Medical History:  Diagnosis Date  . Cataract   . Hyperlipidemia   . Hypertension     Family History  Problem Relation Age of Onset  . Ovarian cancer Mother   . Hypertension Mother   . Hypertension Father   . Heart disease Father   . Hypertension Sister   . Hypertension Sister     Past Surgical History:  Procedure Laterality Date  . ABDOMINAL HYSTERECTOMY    . APPENDECTOMY    . BILATERAL OOPHORECTOMY    . CATARACT EXTRACTION, BILATERAL Bilateral 1996   Social History   Occupational History  . Not on file  Tobacco Use  . Smoking status: Never Smoker  . Smokeless tobacco: Never Used  Substance and Sexual Activity  . Alcohol use: Yes    Comment: MIX OCCASIONALLY  . Drug use:  No  . Sexual activity: Not on file

## 2017-10-28 ENCOUNTER — Ambulatory Visit (INDEPENDENT_AMBULATORY_CARE_PROVIDER_SITE_OTHER): Payer: Self-pay | Admitting: Physical Medicine and Rehabilitation

## 2017-11-20 ENCOUNTER — Other Ambulatory Visit: Payer: Self-pay

## 2017-11-20 ENCOUNTER — Inpatient Hospital Stay (HOSPITAL_COMMUNITY)
Admission: EM | Admit: 2017-11-20 | Discharge: 2017-11-25 | DRG: 244 | Disposition: A | Discharge status: Home Health | Payer: Medicare Other | Attending: Cardiovascular Disease | Admitting: Cardiovascular Disease

## 2017-11-20 ENCOUNTER — Inpatient Hospital Stay (HOSPITAL_COMMUNITY): Payer: Medicare Other

## 2017-11-20 ENCOUNTER — Encounter (HOSPITAL_COMMUNITY): Payer: Self-pay

## 2017-11-20 ENCOUNTER — Emergency Department (HOSPITAL_COMMUNITY): Payer: Medicare Other

## 2017-11-20 DIAGNOSIS — M1990 Unspecified osteoarthritis, unspecified site: Secondary | ICD-10-CM | POA: Diagnosis present

## 2017-11-20 DIAGNOSIS — Z8249 Family history of ischemic heart disease and other diseases of the circulatory system: Secondary | ICD-10-CM

## 2017-11-20 DIAGNOSIS — I447 Left bundle-branch block, unspecified: Secondary | ICD-10-CM | POA: Diagnosis present

## 2017-11-20 DIAGNOSIS — I459 Conduction disorder, unspecified: Secondary | ICD-10-CM

## 2017-11-20 DIAGNOSIS — E785 Hyperlipidemia, unspecified: Secondary | ICD-10-CM | POA: Diagnosis present

## 2017-11-20 DIAGNOSIS — I4430 Unspecified atrioventricular block: Secondary | ICD-10-CM | POA: Diagnosis present

## 2017-11-20 DIAGNOSIS — Z8041 Family history of malignant neoplasm of ovary: Secondary | ICD-10-CM | POA: Diagnosis not present

## 2017-11-20 DIAGNOSIS — W0110XA Fall on same level from slipping, tripping and stumbling with subsequent striking against unspecified object, initial encounter: Secondary | ICD-10-CM | POA: Diagnosis present

## 2017-11-20 DIAGNOSIS — R55 Syncope and collapse: Secondary | ICD-10-CM

## 2017-11-20 DIAGNOSIS — R001 Bradycardia, unspecified: Secondary | ICD-10-CM | POA: Diagnosis present

## 2017-11-20 DIAGNOSIS — J45909 Unspecified asthma, uncomplicated: Secondary | ICD-10-CM | POA: Diagnosis present

## 2017-11-20 DIAGNOSIS — Z86010 Personal history of colonic polyps: Secondary | ICD-10-CM | POA: Diagnosis not present

## 2017-11-20 DIAGNOSIS — I10 Essential (primary) hypertension: Secondary | ICD-10-CM | POA: Diagnosis present

## 2017-11-20 DIAGNOSIS — Z882 Allergy status to sulfonamides status: Secondary | ICD-10-CM

## 2017-11-20 DIAGNOSIS — I440 Atrioventricular block, first degree: Secondary | ICD-10-CM | POA: Diagnosis present

## 2017-11-20 DIAGNOSIS — Z950 Presence of cardiac pacemaker: Secondary | ICD-10-CM

## 2017-11-20 DIAGNOSIS — Z90710 Acquired absence of both cervix and uterus: Secondary | ICD-10-CM | POA: Diagnosis not present

## 2017-11-20 DIAGNOSIS — I340 Nonrheumatic mitral (valve) insufficiency: Secondary | ICD-10-CM | POA: Diagnosis not present

## 2017-11-20 DIAGNOSIS — Y9289 Other specified places as the place of occurrence of the external cause: Secondary | ICD-10-CM | POA: Diagnosis not present

## 2017-11-20 DIAGNOSIS — E876 Hypokalemia: Secondary | ICD-10-CM | POA: Diagnosis present

## 2017-11-20 DIAGNOSIS — Z9842 Cataract extraction status, left eye: Secondary | ICD-10-CM

## 2017-11-20 DIAGNOSIS — S01112A Laceration without foreign body of left eyelid and periocular area, initial encounter: Secondary | ICD-10-CM | POA: Diagnosis present

## 2017-11-20 DIAGNOSIS — I441 Atrioventricular block, second degree: Principal | ICD-10-CM | POA: Diagnosis present

## 2017-11-20 DIAGNOSIS — Z9841 Cataract extraction status, right eye: Secondary | ICD-10-CM | POA: Diagnosis not present

## 2017-11-20 LAB — URINALYSIS, ROUTINE W REFLEX MICROSCOPIC
BILIRUBIN URINE: NEGATIVE
Glucose, UA: NEGATIVE mg/dL
Hgb urine dipstick: NEGATIVE
Ketones, ur: NEGATIVE mg/dL
LEUKOCYTES UA: NEGATIVE
NITRITE: NEGATIVE
Protein, ur: NEGATIVE mg/dL
SPECIFIC GRAVITY, URINE: 1.004 — AB (ref 1.005–1.030)
pH: 7 (ref 5.0–8.0)

## 2017-11-20 LAB — BASIC METABOLIC PANEL
ANION GAP: 10 (ref 5–15)
BUN: 12 mg/dL (ref 8–23)
CALCIUM: 9.1 mg/dL (ref 8.9–10.3)
CO2: 26 mmol/L (ref 22–32)
Chloride: 102 mmol/L (ref 98–111)
Creatinine, Ser: 0.97 mg/dL (ref 0.44–1.00)
GFR calc Af Amer: >60 mL/min (ref >60)
GFR, EST NON AFRICAN AMERICAN: 52 mL/min — AB (ref >60)
Glucose, Bld: 98 mg/dL (ref 70–99)
POTASSIUM: 3.6 mmol/L (ref 3.5–5.1)
Sodium: 138 mmol/L (ref 135–145)

## 2017-11-20 LAB — CBC
HCT: 48.1 % — ABNORMAL HIGH (ref 36.0–46.0)
HEMOGLOBIN: 15 g/dL (ref 12.0–15.0)
MCH: 26.5 pg (ref 26.0–34.0)
MCHC: 31.2 g/dL (ref 30.0–36.0)
MCV: 84.8 fL (ref 78.0–100.0)
Platelets: 201 10*3/uL (ref 150–400)
RBC: 5.67 MIL/uL — ABNORMAL HIGH (ref 3.87–5.11)
RDW: 14.8 % (ref 11.5–15.5)
WBC: 6.5 10*3/uL (ref 4.0–10.5)

## 2017-11-20 LAB — SURGICAL PCR SCREEN
MRSA, PCR: NEGATIVE
Staphylococcus aureus: NEGATIVE

## 2017-11-20 LAB — CBG MONITORING, ED: GLUCOSE-CAPILLARY: 134 mg/dL — AB (ref 70–99)

## 2017-11-20 MED ORDER — CEFAZOLIN SODIUM-DEXTROSE 2-4 GM/100ML-% IV SOLN
2.0000 g | INTRAVENOUS | Status: AC
  Filled 2017-11-20: qty 100

## 2017-11-20 MED ORDER — LISINOPRIL-HYDROCHLOROTHIAZIDE 20-25 MG PO TABS: 1.0000 | ORAL_TABLET | Freq: Every day | ORAL | Status: DC

## 2017-11-20 MED ORDER — ROSUVASTATIN CALCIUM 10 MG PO TABS
5.0000 mg | ORAL_TABLET | Freq: Every day | ORAL | Status: DC
  Administered 2017-11-20 – 2017-11-24 (×5): 5 mg via ORAL
  Filled 2017-11-20 (×5): qty 1

## 2017-11-20 MED ORDER — HEPARIN SODIUM (PORCINE) 5000 UNIT/ML IJ SOLN
5000.0000 [IU] | Freq: Three times a day (TID) | INTRAMUSCULAR | Status: DC
  Administered 2017-11-20 – 2017-11-24 (×11): 5000 [IU] via SUBCUTANEOUS
  Filled 2017-11-20 (×11): qty 1

## 2017-11-20 MED ORDER — LISINOPRIL 20 MG PO TABS
20.0000 mg | ORAL_TABLET | Freq: Every day | ORAL | Status: DC
  Administered 2017-11-21 – 2017-11-25 (×4): 20 mg via ORAL
  Filled 2017-11-20 (×4): qty 1

## 2017-11-20 MED ORDER — HYDRALAZINE HCL 50 MG PO TABS
50.0000 mg | ORAL_TABLET | Freq: Three times a day (TID) | ORAL | Status: DC
  Administered 2017-11-20 – 2017-11-25 (×14): 50 mg via ORAL
  Filled 2017-11-20 (×14): qty 1

## 2017-11-20 MED ORDER — ALBUTEROL SULFATE (2.5 MG/3ML) 0.083% IN NEBU: 3.0000 mL | INHALATION_SOLUTION | Freq: Two times a day (BID) | RESPIRATORY_TRACT | Status: DC | PRN

## 2017-11-20 MED ORDER — SODIUM CHLORIDE 0.9 % IV SOLN
INTRAVENOUS | Status: DC
  Administered 2017-11-21: 06:00:00 via INTRAVENOUS

## 2017-11-20 MED ORDER — AMLODIPINE BESYLATE 5 MG PO TABS
5.0000 mg | ORAL_TABLET | Freq: Every day | ORAL | Status: DC
  Administered 2017-11-21 – 2017-11-25 (×4): 5 mg via ORAL
  Filled 2017-11-20 (×4): qty 1

## 2017-11-20 MED ORDER — SODIUM CHLORIDE 0.9% FLUSH
3.0000 mL | Freq: Two times a day (BID) | INTRAVENOUS | Status: DC
  Administered 2017-11-20 – 2017-11-25 (×8): 3 mL via INTRAVENOUS

## 2017-11-20 MED ORDER — SODIUM CHLORIDE 0.9 % IV SOLN
80.0000 mg | INTRAVENOUS | Status: AC
  Filled 2017-11-20 (×2): qty 2

## 2017-11-20 MED ORDER — ADULT MULTIVITAMIN W/MINERALS CH
1.0000 | ORAL_TABLET | Freq: Every day | ORAL | Status: DC
  Administered 2017-11-21 – 2017-11-25 (×4): 1 via ORAL
  Filled 2017-11-20 (×4): qty 1

## 2017-11-20 MED ORDER — ALPRAZOLAM 0.25 MG PO TABS
0.2500 mg | ORAL_TABLET | Freq: Two times a day (BID) | ORAL | Status: DC | PRN
  Administered 2017-11-21 – 2017-11-24 (×4): 0.25 mg via ORAL
  Filled 2017-11-20 (×4): qty 1

## 2017-11-20 MED ORDER — ACETAMINOPHEN 500 MG PO TABS
1000.0000 mg | ORAL_TABLET | Freq: Once | ORAL | Status: AC
  Administered 2017-11-20: 1000 mg via ORAL
  Filled 2017-11-20: qty 2

## 2017-11-20 MED ORDER — ESCITALOPRAM OXALATE 5 MG PO TABS
2.5000 mg | ORAL_TABLET | Freq: Every day | ORAL | Status: DC
  Administered 2017-11-21 – 2017-11-25 (×4): 2.5 mg via ORAL
  Filled 2017-11-20 (×5): qty 1

## 2017-11-20 MED ORDER — HYDROCHLOROTHIAZIDE 25 MG PO TABS
25.0000 mg | ORAL_TABLET | Freq: Every day | ORAL | Status: DC
  Administered 2017-11-21 – 2017-11-25 (×4): 25 mg via ORAL
  Filled 2017-11-20 (×4): qty 1

## 2017-11-20 NOTE — ED Notes (Signed)
Cardiology at bedside assessing patient

## 2017-11-20 NOTE — H&P (Addendum)
Cardiology Admission History and Physical:   Patient ID: ARMENTA ERSKIN MRN: [id number]; DOB: Feb 21, 1932   Admission date: 11/20/2017  Primary Care Provider: [name], MD Primary Cardiologist: Lauren Rouge, MD  Primary Electrophysiologist:  None  Chief Complaint:  Symptomatic Bradycardia   Patient Profile:   Lauren [facility]ay is a 84 y.o. 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  History of Present Illness:   Lauren Holloway is an 84 yo female that presents today with symptomatic bradycardia with PMH as above, seen by cardiology with EP consulted.   10/29/13 TTE EF 60-65% and PASP estimated 38 mmHg.  June 05, 2015 EKG showed new LBBB with patient reportedly asx.  09/01/2017: Last seen by Dr. Johnsie Holloway for HTN and reportedly referred to him by Dr Lauren [facility]ay in 06-04-13 for dyspnea originally thought to be non-cardiac in etiology 04-Jun-2013 EF 60-65%). Per documentation, at that time, Lauren dyspnea was thought to be due to some reactive airway disease (patient a non smoker) and was improved with Qvar. Also noted that visit, Lauren patient reportedly had a twin brother that died in 06-05-2014 and was under lots of stress taking care of her 56 yo sister with dementia. EKG at that time was SR, 62bpm, IVCD LBBB type. Per this same office visit documentation (09/01/2017), Lauren patient previously had a 06/05/2015 bifasicular block without high grade AV block. However, at Lauren time of Lauren 09/01/2017 visit, Lauren RBBB was resolved. Home medications at that time (09/2017) included Xanax, Norvasc, ASA, Lisinopril, Lisinopril- HCTZ, Lopressor, Crestor. Per Lauren [facility]ay note, Lauren plan at that time was for follow-up with him in 1 year and get routine blood work with Lauren [facility].   Over Lauren last week, Lauren patient has  reportedly felt more fatigued.   On 11/20/2017, [facility] after sx of pre-syncope and subsequent fall (no LOC). She reportedly had gone to Lauren hair salon and felt dizzy on her way to Lauren shampoo sink. She "fell out" at that time, hitting her head and sustaining a small laceration to Lauren left side of forehead. EMS was called, and upon EMS arrival, Lauren patient was noted to be bradycardic with HR in Lauren 40s. She received IVF in Lauren field with HR improving to 70s. Per Lauren patient's report, her BP medication had recently changed with addition of HCTZ (?patient reports this was added as HCTZ 25mg  po TID and not taken as a combo medication with Lisinopril - record of this not found at this time in [facility]).  On arrival to Lauren ED, Lauren patient received sutures for her forehead laceration and proceeded for a head CT. During Lauren head CT, a RN noted patient's HR 30s and noted possible 2nd degree block but was unable to obtain EKG at that time. Patient stated she was asx during this event, and following Lauren event, her HR returned to 62bpm. Shortly thereafter, she reportedly successfully ambulated with assistance and steady gate. Follow-up EKG showed sinus pause (provider listed as Lauren [facility]ay), captured under Lauren ECG tab.   EKG 11:12AM: bradycardic sinus rhythm at 56 bpm with sinus pause and existing left bundle branch block, QTc 485. Labs: Na 138, K 3.6, glucose 98, Cr 0.97, Ca 9.1, WBC 6.5, Hgb 15.0, plts 201 Vitals: HR 39-59bpm, BP 157-205  systolic / 82-50 diastolic. SpO2 95-96%; RR 16-22, T 98.79F  This case was discussed with Dr. Johnsie Holloway and Lauren [facility]. At that time, it was revealed that this is not Lauren first episode of Lauren patient falling due to bradycardia. Patient will be admitted with plan for PPM d/t alternating bundle branch blocks and 2:1AV block with symptomatic bradycardia. She has taken metoprolol today, so Lauren procedure will be planned for  tomorrow 11/21/17 once Lauren metoprolol washes out. Further details in A/P below.     Past Medical History:  Diagnosis Date  . Cataract   . Hyperlipidemia   . Hypertension     Past Surgical History:  Procedure Laterality Date  . ABDOMINAL HYSTERECTOMY    . APPENDECTOMY    . BILATERAL OOPHORECTOMY    . CATARACT EXTRACTION, BILATERAL Bilateral 1996     Medications Prior to Admission: Prior to Admission medications   Medication Sig Start Date End Date Taking? Authorizing Provider  ALPRAZolam Duanne Moron) 0.5 MG tablet Take 0.25 mg by mouth 2 (two) times daily as needed for anxiety or sleep. 06/17/16  Yes [provider]  amLODipine (NORVASC) 5 MG tablet Take 5 mg by mouth daily.     Yes [provider]  escitalopram (LEXAPRO) 5 MG tablet Take 2.5 mg by mouth daily.    Yes [provider]  Fish Oil-Cholecalciferol (FISH OIL + D3 PO) Take 1 tablet by mouth daily.    Yes [provider]  FLUAD 0.5 ML SUSY Inject 0.5 mLs into Lauren skin once. 10/21/17 10/21/17  Yes [provider]  ibuprofen (ADVIL,MOTRIN) 400 MG tablet Take 400 mg by mouth every 6 (six) hours as needed for headache, mild pain or moderate pain.   Yes [provider]  lisinopril-hydrochlorothiazide (PRINZIDE,ZESTORETIC) 20-25 MG per tablet Take 1 tablet by mouth daily. 05/03/14  Yes [provider]  metoprolol tartrate (LOPRESSOR) 25 MG tablet Take 25 mg by mouth 2 (two) times daily.  03/18/13  Yes [provider]  Multiple Vitamins-Minerals (MULTIVITAMIN WITH MINERALS) tablet Take 1 tablet by mouth daily.   Yes [provider]  rosuvastatin (CRESTOR) 5 MG tablet Take 5 mg by mouth at bedtime.  06/07/15  Yes [provider]  VENTOLIN HFA 108 (90 BASE) MCG/ACT inhaler Inhale 1 puff into Lauren lungs 2 (two) times daily as needed for wheezing.  04/19/14  Yes [provider]     Allergies:    Allergies  Allergen Reactions  . Sulfonamide  Derivatives     HIVES     Social History:   Social History   Socioeconomic History  . Marital status: Widowed    Spouse name: Not on file  . Number of children: 1  . Years of education: Not on file  . Highest education level: Not on file  Occupational History  . Not on file  Social Needs  . Financial resource strain: Not on file  . Food insecurity:    Worry: Not on file    Inability: Not on file  . Transportation needs:    Medical: Not on file    Non-medical: Not on file  Tobacco Use  . Smoking status: Never Smoker  . Smokeless tobacco: Never Used  Substance and Sexual Activity  . Alcohol use: Yes    Comment: MIX OCCASIONALLY  . Drug use: No  . Sexual activity: Not on file  Lifestyle  . Physical activity:    Days per week: Not on file    Minutes per session:  Not on file  . Stress: Not on file  Relationships  . Social connections:    Talks on phone: Not on file    Gets together: Not on file    Attends religious service: Not on file    Active member of club or organization: Not on file    Attends meetings of clubs or organizations: Not on file    Relationship status: Not on file  . Intimate partner violence:    Fear of current or ex partner: Not on file    Emotionally abused: Not on file    Physically abused: Not on file    Forced sexual activity: Not on file  Other Topics Concern  . Not on file  Social History Narrative  . Not on file    Family History:   Lauren patient's family history includes Heart disease in her father; Hypertension in her father, mother, sister, and sister; Ovarian cancer in her mother.    ROS:  Please see Lauren history of present illness.  All other ROS reviewed and negative.     Physical Exam/Data:   Vitals:   11/20/17 1030 11/20/17 1100 11/20/17 1130 11/20/17 1200  BP: (!) 180/56 (!) 157/71 (!) 171/58 (!) 164/72  Pulse: (!) 39 (!) 57 (!) 53 (!) 52  Resp: 20 (!) 22 20 18   Temp:      TempSrc:      SpO2: 95% 95% 96% 96%   No  intake or output data in Lauren 24 hours ending 11/20/17 1211 There were no vitals filed for this visit. There is no height or weight on file to calculate BMI.  General:  Well nourished, well developed, in no acute distress HEENT: normal Lymph: no adenopathy Neck: no JVD Endocrine:  No thryomegaly Vascular: No carotid bruits; FA pulses 2+ bilaterally without bruits  Cardiac:  bradycardic 2:1AV block; no murmur  Lungs:  clear to auscultation bilaterally, no wheezing, rhonchi or rales  Abd: soft, nontender, no hepatomegaly  Ext: trace b/l pedal edema Musculoskeletal:  No deformities Skin: warm and dry  Neuro:  CNs 2-12 intact, no focal abnormalities noted Psych:  Normal affect    EKG:  Lauren ECG that was done and is listed above in HPI  Relevant CV Studies: 10/29/2013 TTE  Left ventricle: Lauren cavity size was normal. Systolic function was normal. Lauren estimated ejection fraction was in Lauren range of 60% to 65%. Wall motion was normal; there were no regional wall motion abnormalities. Left ventricular diastolic function parameters were normal. - Pulmonary arteries: Systolic pressure was mildly increased. PA peak pressure: 39 mm Hg (S).  Laboratory Data:  Chemistry Recent Labs  Lab 11/20/17 1014  NA 138  K 3.6  CL 102  CO2 26  GLUCOSE 98  BUN 12  CREATININE 0.97  CALCIUM 9.1  GFRNONAA 52*  GFRAA >60  ANIONGAP 10    No results for input(s): PROT, ALBUMIN, AST, ALT, ALKPHOS, BILITOT in Lauren last 168 hours. Hematology Recent Labs  Lab 11/20/17 1014  WBC 6.5  RBC 5.67*  HGB 15.0  HCT 48.1*  MCV 84.8  MCH 26.5  MCHC 31.2  RDW 14.8  PLT 201   Cardiac EnzymesNo results for input(s): TROPONINI in Lauren last 168 hours. No results for input(s): TROPIPOC in Lauren last 168 hours.  BNPNo results for input(s): BNP, PROBNP in Lauren last 168 hours.  DDimer No results for input(s): DDIMER in Lauren last 168 hours.  Radiology/Studies:  Ct Head Wo Contrast  Result Date:  11/20/2017 CLINICAL DATA:  Left forehead injury after syncope and fall today. No loss of consciousness. EXAM: CT HEAD WITHOUT CONTRAST TECHNIQUE: Contiguous axial images were obtained from Lauren base of Lauren skull through Lauren vertex without intravenous contrast. COMPARISON:  None. FINDINGS: Brain: Mild chronic ischemic white matter disease is noted. No mass effect or midline shift is noted. Ventricular size is within normal limits. There is no evidence of mass lesion, hemorrhage or acute infarction. Vascular: No hyperdense vessel or unexpected calcification. Skull: Normal. Negative for fracture or focal lesion. Sinuses/Orbits: No acute finding. Other: Small left frontal scalp hematoma is noted. IMPRESSION: Small left frontal scalp hematoma. Mild chronic ischemic white matter disease. No acute intracranial abnormality seen. Electronically Signed   By: Marijo Conception, M.D.   On: 11/20/2017 10:49    Assessment and Plan:   2:1 AV Block with plan for PPM - No chest pain. No current SOB. - H/o alternating LBBB, RBBB. 2:1 AV block. Symptomatic with fatigue, near-syncope, syncope. - EKG bradycardic sinus rhythm at 56 bpm with sinus pause and existing left bundle branch block, QTc 485. - Telemetry significant for 2:1 block; HR 34-59. Hypertensive since admission 143/76-205/77 - Home medications as above include xanax 0.5, norvasc 5mg , lisinopril-hctz, lopressor 25mg  bid. Labs: Na 138, K 3.6, glucose 98, Cr 0.97, Ca 9.1, WBC 6.5, Hgb 15.0, plts 201. Most recent echo in 2015 with normal EF. - Subcutaneous heparin.  - Hold Metoprolol, Norvasc tomorrow 9/20  - NPO after midnight in preparation for PPM - PPM implantation 9/20- need to allow Metoprolol to wash out. Risks and benefits reviewed with patient with understanding and agreement to undergo procedure for PPM  HTN - Hypertensive at admission with most recent BP 168/72, HR 34 - Home medications include Norvasc 5mg , Lisinopril - HCTZ 20-25, Lopressor -  Holding Norvasc and Lopressor in preparation for PPM.  - Continue to monitor vitals as patient remains hypertensive.   Dyspnea (on inhalers) - Previous documentation notes h/o Asthma - Nonsmoker - Continue Ventolin   - SpO2 93-99%. - O2 as needed to keep SpO2 above 95%  HLD - Crestor - LDL goal <70   Hypokalemia - K 3.6 at admission - Replete with goal 4.0 - Follow-up BMET in AM   Severity of Illness: Lauren appropriate patient status for this patient is INPATIENT. Inpatient status is judged to be reasonable and necessary in order to provide Lauren required intensity of service to ensure Lauren patient's safety. Lauren patient's presenting symptoms, physical exam findings, and initial radiographic and laboratory data in Lauren context of their chronic comorbidities is felt to place them at high risk for further clinical deterioration. Furthermore, it is not anticipated that Lauren patient will be medically stable for discharge from Lauren hospital within 2 midnights of admission. Lauren following factors support Lauren patient status of inpatient.   " Lauren patient's presenting symptoms include 2:1AV block with collapse. " Lauren worrisome physical exam findings include 2:1 AV block. " Lauren initial radiographic and laboratory data are worrisome because of EKG " Lauren chronic co-morbidities include Bradycardia, HLD, HTN.   * I certify that at Lauren point of admission it is my clinical judgment that Lauren patient will require inpatient hospital care spanning beyond 2 midnights from Lauren point of admission due to high intensity of service, high risk for further deterioration and high frequency of surveillance required.*    For questions or updates, please contact [facility] Please consult [email or web address] for contact info under  Signed, Arvil Chaco, PA-C  11/20/2017 12:11 PM    Patient examined chart reviewed Case discussed with Dr Curt [facility] EP, patient and PA She has had chronic conduction system  disease. 2 years ago had tri fasicular block with RBBB Now has LBBB. Syncope in setting of alternating bundle branch block suggests cause of syncope is bradycardia She did take lopressor this am Will let wash out and evaluate in am Suspect she will need PPM  For her BP will start hydralazine 50 q8 Norvasc is not generally associated with much AV block. She felt very tired on diuretic but I think she was taking it tid incorrectly Can consider aldomet or nitrates if needed before PPM placed Exam with elderly black female S4 gallop hematoma over left eye with ecchymosis clear lungs benign abdomen with no bruit. Suspect BP will come down when bradycardia resolved and PPM would also allow [place] to use many more medications to Rx   [employer]

## 2017-11-20 NOTE — ED Notes (Signed)
While pt in CT it was noted by this Rn that pt's Hr was in the 30s and pt appeared to be in a 2nd degree block on the monitor. Unable to obtain EKG due to baby monitor being out. This RN and Arboriculturist went to Ct to check on pt. Pt is asymptomatic and HR up to 62 NSR now. NAD

## 2017-11-20 NOTE — ED Notes (Signed)
Pt ambulated with steady gait. Pt needed standby assistance while ambulating.

## 2017-11-20 NOTE — ED Notes (Signed)
Patient transported to CT 

## 2017-11-20 NOTE — ED Triage Notes (Signed)
Per [facility], pt was at the beauty salon this morning and stood up got dizzy and fell, pt hit head and has small lac to left forehead, not on thinners. Axox4. No neuro deficits. Pt found to bradycardic in the 40s with EMS, fluids started and HR up to 70. Pt recently had change in BP medication regimen and thinks that is her problem.

## 2017-11-20 NOTE — ED Provider Notes (Addendum)
[facility]ERGENC[facility] DEPARTMENT Provider Note   CSN: [id number] Arrival date & time: 11/20/17  1002     History   Chief Complaint Chief Complaint  Patient presents with  . Fall  . Dizziness  . Bradycardia    HPI Lauren Holloway is a 84 y.o. female.  HPI 84 year old female reports intermittent lightheadedness through the week followed by an episode of lightheadedness today while she was walking and associated syncope.  She struck the left side of her head.  She presents with mild headache.  No use of anticoagulants.  Presents with a small periorbital laceration of the lateral aspect of her orbit on the left eye.  No trismus or malocclusion.  No significant neck pain.  No weakness of her arms or legs.  She reports a sensation of lightheadedness just prior to syncope.  Is reported by my nursing staff that she had episodes of sinus pause during triage.  EKG shows what appears to be second-degree AV block type II.  No chest pain or chest tightness at this time.  Is reported that the patient had bradycardic episodes into the 40s with EMS.  She recently has had a change in her hydrochlorothiazide.  She denies excessive urination.  Currently at time my evaluation the patient is asymptomatic besides her mild headache.   Past Medical History:  Diagnosis Date  . Cataract   . Hyperlipidemia   . Hypertension     Patient Active Problem List   Diagnosis Date Noted  . AV heart block 11/20/2017  . Pain in right hip 10/17/2017  . Chronic right shoulder pain 03/17/2017  . Grade 2 ankle sprain, unspecified laterality, subsequent encounter 05/28/2016  . Dyspnea 10/25/2013  . Essential hypertension 05/18/2010  . ABDOMINAL PAIN RIGHT LOWER QUADRANT 05/18/2010  . PERSONAL HISTORY OF COLONIC POLYPS 05/18/2010    Past Surgical History:  Procedure Laterality Date  . ABDOMINAL HYSTERECTOMY    . APPENDECTOMY    . BILATERAL OOPHORECTOMY    . CATARACT EXTRACTION, BILATERAL  Bilateral 1996     OB History   None      Home Medications    Prior to Admission medications   Medication Sig Start Date End Date Taking? Authorizing Provider  ALPRAZolam Duanne Moron) 0.5 MG tablet Take 0.25 mg by mouth 2 (two) times daily as needed for anxiety or sleep. 06/17/16  Yes [provider]  amLODipine (NORVASC) 5 MG tablet Take 5 mg by mouth daily.     Yes [provider]  escitalopram (LEXAPRO) 5 MG tablet Take 2.5 mg by mouth daily.    Yes [provider]  Fish Oil-Cholecalciferol (FISH OIL + D3 PO) Take 1 tablet by mouth daily.    Yes [provider]  FLUAD 0.5 ML SUSY Inject 0.5 mLs into the skin once. 10/21/17 10/21/17  Yes [provider]  ibuprofen (ADVIL,MOTRIN) 400 MG tablet Take 400 mg by mouth every 6 (six) hours as needed for headache, mild pain or moderate pain.   Yes [provider]  lisinopril-hydrochlorothiazide (PRINZIDE,ZESTORETIC) 20-25 MG per tablet Take 1 tablet by mouth daily. 05/03/14  Yes [provider]  metoprolol tartrate (LOPRESSOR) 25 MG tablet Take 25 mg by mouth 2 (two) times daily.  03/18/13  Yes [provider]  Multiple Vitamins-Minerals (MULTIVITAMIN WITH MINERALS) tablet Take 1 tablet by mouth daily.   Yes [provider]  rosuvastatin (CRESTOR) 5 MG tablet Take 5 mg by mouth at bedtime.  06/07/15  Yes [provider]  VENTOLIN HFA 108 (90 BASE) MCG/ACT inhaler Inhale 1 puff into the lungs 2 (two) times daily as needed for wheezing.  04/19/14  Yes [provider]    Family History Family History  Problem Relation Age of Onset  . Ovarian cancer Mother   . Hypertension Mother   . Hypertension Father   . Heart disease Father   . Hypertension Sister   . Hypertension Sister     Social History Social History   Tobacco Use  . Smoking status: Never Smoker  . Smokeless tobacco: Never Used  Substance Use Topics  . Alcohol use: Yes    Comment: MIX  OCCASIONALLY  . Drug use: No     Allergies   Sulfonamide derivatives   Review of Systems Review of Systems  All other systems reviewed and are negative.    Physical Exam Updated Vital Signs BP (!) 152/62   Pulse (!) 53   Temp 98.1 F (36.7 C) (Oral)   Resp (!) 23   SpO2 95%   Physical Exam  Constitutional: She is oriented to person, place, and time. She appears well-developed and well-nourished. No distress.  HENT:  Head: Normocephalic and atraumatic.  Eyes: EOM are normal.  Neck: Normal range of motion.  Cardiovascular: Normal rate, regular rhythm and normal heart sounds.  Pulmonary/Chest: Effort normal and breath sounds normal.  Abdominal: Soft. She exhibits no distension. There is no tenderness.  Musculoskeletal: Normal range of motion.  Neurological: She is alert and oriented to person, place, and time.  Skin: Skin is warm and dry.  Psychiatric: She has a normal mood and affect. Judgment normal.  Nursing note and vitals reviewed.    ED Treatments / Results  Labs (all labs ordered are listed, but only abnormal results are displayed) Labs Reviewed  BASIC METABOLIC PANEL - Abnormal; Notable for the following components:      Result Value   GFR calc non Af Amer 52 (*)    All other components within normal limits  CBC - Abnormal; Notable for the following components:   RBC 5.67 (*)    HCT 48.1 (*)    All other components within normal limits  URINALYSIS, ROUTINE W REFLEX MICROSCOPIC - Abnormal; Notable for the following components:   Color, Urine STRAW (*)    Specific Gravity, Urine 1.004 (*)    All other components within normal limits  CBG MONITORING, ED - Abnormal; Notable for the following components:   Glucose-Capillary 134 (*)    All other components within normal limits    EKG EKG Interpretation  Date/Time:  Thursday November 20 2017 11:12:59 EDT Ventricular Rate:  56 PR Interval:    QRS Duration: 142 QT Interval:  485 QTC  Calculation: 485 R Axis:   39 Text Interpretation:  Sinus rhythm Sinus pause Left bundle branch block Confirmed by Jola Schmidt [phone]) on 11/20/2017 12:33:23 PM   Radiology Ct Head Wo Contrast  Result Date: 11/20/2017 CLINICAL DATA:  Left forehead injury after syncope and fall today. No loss of consciousness. EXAM: CT HEAD WITHOUT CONTRAST TECHNIQUE: Contiguous axial images were obtained from the base of the skull through the vertex without intravenous contrast. COMPARISON:  None. FINDINGS: Brain: Mild chronic ischemic white matter disease is noted. No mass effect or midline shift is noted. Ventricular size is within normal limits. There is no evidence of mass lesion, hemorrhage or acute infarction. Vascular: No hyperdense vessel or unexpected calcification. Skull: Normal. Negative for fracture or focal lesion. Sinuses/Orbits: No acute  finding. Other: Small left frontal scalp hematoma is noted. IMPRESSION: Small left frontal scalp hematoma. Mild chronic ischemic white matter disease. No acute intracranial abnormality seen. Electronically Signed   By: Marijo Conception, M.D.   On: 11/20/2017 10:49   Dg Finger Thumb Right  Result Date: 11/20/2017 CLINICAL DATA:  Pain EXAM: RIGHT THUMB 2+V COMPARISON:  None. FINDINGS: Generalized osteopenia. No acute fracture or dislocation. 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 Mild osteoarthritis of the first [place] joint. No aggressive osseous lesion. IMPRESSION: No acute osseous injury of the right thumb. Electronically Signed   By: Kathreen Devoid   On: 11/20/2017 15:25    Procedures .Marland KitchenLaceration Repair Performed by: Jola Schmidt, MD Authorized by: Jola Schmidt, MD     LACERATION REPAIR Performed by: Jola Schmidt Consent: Verbal consent obtained. Risks and benefits: risks, benefits and alternatives were discussed Patient identity confirmed: provided demographic data Time out performed prior to procedure Prepped and Draped in normal  sterile fashion Wound explored Laceration Location: left periorbital rim Laceration Length: 2.5cm No Foreign Bodies seen or palpated Anesthesia: local infiltration Local anesthetic: lidocaine 2% with epinephrine Anesthetic total: 7 ml Irrigation method: syringe Amount of cleaning: standard Skin closure: 4-0 nylon Number of sutures or staples: 6 Technique: simple interrupted Patient tolerance: Patient tolerated the procedure well with no immediate complications.   Medications Ordered in ED Medications  hydrALAZINE (APRESOLINE) tablet 50 mg (has no administration in time range)  amLODipine (NORVASC) tablet 5 mg (has no administration in time range)  acetaminophen (TYLENOL) tablet 1,000 mg (1,000 mg Oral Given 11/20/17 1441)     Initial Impression / Assessment and Plan / ED Course  I have reviewed the triage vital signs and the nursing notes.  Pertinent labs & imaging results that were available during my care of the patient were reviewed by me and considered in my medical decision making (see chart for details).     Patient with second-degree AV block type II noted on her EKG.  The majority of her evaluation in the emergency department she is in sinus rhythm with occasional episodes of AV block.  This may be related to her metoprolol.  She will need to have her metoprolol held and will need to be admitted to the hospital.  Cardiology consultation in the ER for admission to the hospital  Consults: Dr Johnsie Cancel (cards) Dr Curt Bears (EP)    Final Clinical Impressions(s) / ED Diagnoses   Final diagnoses:  Syncope, unspecified syncope type    ED Discharge Orders    None       Jola Schmidt, MD 11/20/17 1604    Jola Schmidt, MD 11/20/17 [phone]

## 2017-11-20 NOTE — Consult Note (Signed)
Cardiology Consultation:   Patient ID: Lauren Holloway MRN: [id number]; DOB: Apr 28, 1931  Admit date: 11/20/2017 Date of Consult: 11/20/2017  Primary Care Provider: [name], MD Primary Cardiologist: [name], MD  Primary Electrophysiologist:  New   Patient Profile:   Lauren Holloway is a 84 y.o. female with a hx of hypertension, asthma, left bundle branch block, hyperlipidemia who is being seen today for the evaluation of 2-1 AV block at the request of [name].  History of Present Illness:   Ms. Linn she has felt significant fatigue over the last week.  She presented to the emergency room today after feeling dizzy at the hair salon.  She fell out hitting her head and sustaining a small laceration on her left forehead.  The patient was noted to be bradycardic initially.  With IV fluids, her heart rate increased to the 70s.  On arrival to the emergency room, she was taken to the [facility] where the nurse noted heart rates in the 30s.  This was consistent with 2-1 AV block.  She was asymptomatic during that event and her heart rate returned to 62 beats a minute.  Shortly thereafter, she successfully ambulated.  EKG showed sinus pauses and a left bundle branch block.  Past Medical History:  Diagnosis Date  . Cataract   . Hyperlipidemia   . Hypertension     Past Surgical History:  Procedure Laterality Date  . ABDOMINAL HYSTERECTOMY    . APPENDECTOMY    . BILATERAL OOPHORECTOMY    . CATARACT EXTRACTION, BILATERAL Bilateral 1996     Home Medications:  Prior to Admission medications   Medication Sig Start Date End Date Taking? Authorizing Provider  ALPRAZolam Duanne Moron) 0.5 MG tablet Take 0.25 mg by mouth 2 (two) times daily as needed for anxiety or sleep. 06/17/16  Yes [provider]  amLODipine (NORVASC) 5 MG tablet Take 5 mg by mouth daily.     Yes [provider]  escitalopram (LEXAPRO) 5 MG tablet Take 2.5 mg by mouth daily.    Yes [provider]  Fish Oil-Cholecalciferol (FISH OIL + D3 PO) Take 1 tablet by mouth daily.    Yes [provider]  FLUAD 0.5 ML SUSY Inject 0.5 mLs into the skin once. 10/21/17 10/21/17  Yes [provider]  ibuprofen (ADVIL,MOTRIN) 400 MG tablet Take 400 mg by mouth every 6 (six) hours as needed for headache, mild pain or moderate pain.   Yes [provider]  lisinopril-hydrochlorothiazide (PRINZIDE,ZESTORETIC) 20-25 MG per tablet Take 1 tablet by mouth daily. 05/03/14  Yes [provider]  metoprolol tartrate (LOPRESSOR) 25 MG tablet Take 25 mg by mouth 2 (two) times daily.  03/18/13  Yes [provider]  Multiple Vitamins-Minerals (MULTIVITAMIN WITH MINERALS) tablet Take 1 tablet by mouth daily.   Yes [provider]  rosuvastatin (CRESTOR) 5 MG tablet Take 5 mg by mouth at bedtime.  06/07/15  Yes [provider]  VENTOLIN HFA 108 (90 BASE) MCG/ACT inhaler Inhale 1 puff into the lungs 2 (two) times daily as needed for wheezing.  04/19/14  Yes [provider]    Inpatient Medications: Scheduled Meds:  Continuous Infusions: . sodium chloride     PRN Meds:   Allergies:    Allergies  Allergen Reactions  . Sulfonamide Derivatives     HIVES     Social History:   Social History   Socioeconomic History  . Marital status: Widowed    Spouse name: Not  on file  . Number of children: 1  . Years of education: Not on file  . Highest education level: Not on file  Occupational History  . Not on file  Social Needs  . Financial resource strain: Not on file  . Food insecurity:    Worry: Not on file    Inability: Not on file  . Transportation needs:    Medical: Not on file    Non-medical: Not on file  Tobacco Use  . Smoking status: Never Smoker  . Smokeless tobacco: Never Used  Substance and Sexual Activity  . Alcohol use: Yes    Comment: MIX OCCASIONALLY  . Drug use: No  . Sexual activity: Not on file  Lifestyle  .  Physical activity:    Days per week: Not on file    Minutes per session: Not on file  . Stress: Not on file  Relationships  . Social connections:    Talks on phone: Not on file    Gets together: Not on file    Attends religious service: Not on file    Active member of club or organization: Not on file    Attends meetings of clubs or organizations: Not on file    Relationship status: Not on file  . Intimate partner violence:    Fear of current or ex partner: Not on file    Emotionally abused: Not on file    Physically abused: Not on file    Forced sexual activity: Not on file  Other Topics Concern  . Not on file  Social History Narrative  . Not on file    Family History:    Family History  Problem Relation Age of Onset  . Ovarian cancer Mother   . Hypertension Mother   . Hypertension Father   . Heart disease Father   . Hypertension Sister   . Hypertension Sister      ROS:  Please see the history of present illness.  none All other ROS reviewed and negative.     Physical Exam/Data:   Vitals:   11/20/17 1200 11/20/17 1230 11/20/17 1300 11/20/17 1430  BP: (!) 164/72 (!) 161/64 (!) 143/76 (!) 168/72  Pulse: (!) 52 (!) 55 (!) 57 (!) 34  Resp: 18 16 16 19   Temp:      TempSrc:      SpO2: 96% 95% 99% 93%   No intake or output data in the 24 hours ending 11/20/17 1523 There were no vitals filed for this visit. There is no height or weight on file to calculate BMI.  General:  Well nourished, well developed, in no acute distress HEENT: normal Lymph: no adenopathy Neck: no JVD Endocrine:  No thryomegaly Vascular: No carotid bruits; FA pulses 2+ bilaterally without bruits  Cardiac:  normal S1, S2; RRR; no murmur  Lungs:  clear to auscultation bilaterally, no wheezing, rhonchi or rales  Abd: soft, nontender, no hepatomegaly  Ext: no edema Musculoskeletal:  No deformities, BUE and BLE strength normal and equal Skin: warm and dry  Neuro:  CNs 2-12 intact, no focal  abnormalities noted Psych:  Normal affect   EKG:  The EKG was personally reviewed and demonstrates: Sinus rhythm with intermittent left bundle branch block Telemetry:  Telemetry was personally reviewed and demonstrates: Sinus rhythm with intermittent left bundle branch block, intermittent 2-1 AV block  Relevant CV Studies: TTE 2015 - Left ventricle: The cavity size was normal. Systolic function was normal. The estimated ejection fraction was in the  range of 60% to 65%. Wall motion was normal; there were no regional wall motion abnormalities. Left ventricular diastolic function parameters were normal. - Pulmonary arteries: Systolic pressure was mildly increased. PA peak pressure: 39 mm Hg (S).  Laboratory Data:  Chemistry Recent Labs  Lab 11/20/17 1014  NA 138  K 3.6  CL 102  CO2 26  GLUCOSE 98  BUN 12  CREATININE 0.97  CALCIUM 9.1  GFRNONAA 52*  GFRAA >60  ANIONGAP 10    No results for input(s): PROT, ALBUMIN, AST, ALT, ALKPHOS, BILITOT in the last 168 hours. Hematology Recent Labs  Lab 11/20/17 1014  WBC 6.5  RBC 5.67*  HGB 15.0  HCT 48.1*  MCV 84.8  MCH 26.5  MCHC 31.2  RDW 14.8  PLT 201   Cardiac EnzymesNo results for input(s): TROPONINI in the last 168 hours. No results for input(s): TROPIPOC in the last 168 hours.  BNPNo results for input(s): BNP, PROBNP in the last 168 hours.  DDimer No results for input(s): DDIMER in the last 168 hours.  Radiology/Studies:  Ct Head Wo Contrast  Result Date: 11/20/2017 CLINICAL DATA:  Left forehead injury after syncope and fall today. No loss of consciousness. EXAM: CT HEAD WITHOUT CONTRAST TECHNIQUE: Contiguous axial images were obtained from the base of the skull through the vertex without intravenous contrast. COMPARISON:  None. FINDINGS: Brain: Mild chronic ischemic white matter disease is noted. No mass effect or midline shift is noted. Ventricular size is within normal limits. There is no evidence of mass  lesion, hemorrhage or acute infarction. Vascular: No hyperdense vessel or unexpected calcification. Skull: Normal. Negative for fracture or focal lesion. Sinuses/Orbits: No acute finding. Other: Small left frontal scalp hematoma is noted. IMPRESSION: Small left frontal scalp hematoma. Mild chronic ischemic white matter disease. No acute intracranial abnormality seen. Electronically Signed   By: Marijo Conception, M.D.   On: 11/20/2017 10:49    Assessment and Plan:   1. Syncope with 2-1 AV block: She certainly has advanced conduction system disease with a history of both left bundle branch block and right bundle branch block.  She also had 2-1 AV block.  She would likely need a pacemaker in the future, though she does take metoprolol.  We Tinamarie Przybylski stop her metoprolol today and watch her overnight.  Should she have no further episodes of AV block, would likely plan for cardiac monitor as an outpatient to see if she has any more 2-1 AV block.  We Cagney Steenson update her echo today.  Her metoprolol should be washed out by tomorrow morning.  For questions or updates, please contact [place] Please consult [email or web address] for contact info under     Signed, Thayne Cindric Meredith Leeds, MD  11/20/2017 3:23 PM

## 2017-11-20 NOTE — ED Notes (Signed)
Pt taken to CT.

## 2017-11-21 ENCOUNTER — Inpatient Hospital Stay (HOSPITAL_COMMUNITY): Payer: Medicare Other

## 2017-11-21 DIAGNOSIS — I340 Nonrheumatic mitral (valve) insufficiency: Secondary | ICD-10-CM

## 2017-11-21 LAB — BASIC METABOLIC PANEL
Anion gap: 10 (ref 5–15)
BUN: 13 mg/dL (ref 8–23)
CALCIUM: 9.1 mg/dL (ref 8.9–10.3)
CO2: 30 mmol/L (ref 22–32)
CREATININE: 0.93 mg/dL (ref 0.44–1.00)
Chloride: 102 mmol/L (ref 98–111)
GFR calc Af Amer: >60 mL/min (ref >60)
GFR, EST NON AFRICAN AMERICAN: 55 mL/min — AB (ref >60)
GLUCOSE: 111 mg/dL — AB (ref 70–99)
Potassium: 4.4 mmol/L (ref 3.5–5.1)
Sodium: 142 mmol/L (ref 135–145)

## 2017-11-21 LAB — ECHOCARDIOGRAM COMPLETE
HEIGHTINCHES: 64 in
Weight: 2673.74 oz

## 2017-11-21 LAB — GLUCOSE, CAPILLARY: Glucose-Capillary: 103 mg/dL — ABNORMAL HIGH (ref 70–99)

## 2017-11-21 MED ORDER — POLYETHYLENE GLYCOL 3350 17 G PO PACK
17.0000 g | PACK | Freq: Every day | ORAL | Status: DC
  Administered 2017-11-21 – 2017-11-25 (×4): 17 g via ORAL
  Filled 2017-11-21 (×4): qty 1

## 2017-11-21 MED ORDER — DOCUSATE SODIUM 100 MG PO CAPS
200.0000 mg | ORAL_CAPSULE | Freq: Every day | ORAL | Status: DC
  Administered 2017-11-21 – 2017-11-25 (×4): 200 mg via ORAL
  Filled 2017-11-21 (×4): qty 2

## 2017-11-21 MED ORDER — PERFLUTREN LIPID MICROSPHERE
1.0000 mL | INTRAVENOUS | Status: AC | PRN
  Administered 2017-11-21: 2 mL via INTRAVENOUS
  Filled 2017-11-21: qty 10

## 2017-11-21 MED ORDER — HYDROCORTISONE 1 % EX CREA
TOPICAL_CREAM | Freq: Three times a day (TID) | CUTANEOUS | Status: DC
  Administered 2017-11-21: 1 via TOPICAL
  Administered 2017-11-22: 11:00:00 via TOPICAL
  Administered 2017-11-22: 1 via TOPICAL
  Administered 2017-11-22 – 2017-11-23 (×2): via TOPICAL
  Administered 2017-11-23: 1 via TOPICAL
  Administered 2017-11-23 – 2017-11-25 (×4): via TOPICAL
  Filled 2017-11-21: qty 28

## 2017-11-21 NOTE — Progress Notes (Signed)
Echocardiogram 2D Echocardiogram has been performed.  Matilde Bash 11/21/2017, 10:57 AM

## 2017-11-21 NOTE — Progress Notes (Signed)
Progress Note  Patient Name: Lauren Holloway Date of Encounter: 11/21/2017  Primary Cardiologist: [name], MD   EP:  [place] when going to bathroom   Inpatient Medications    Scheduled Meds: . amLODipine  5 mg Oral Daily  . escitalopram  2.5 mg Oral Daily  . gentamicin irrigation  80 mg Irrigation To Cath  . heparin  5,000 Units Subcutaneous Q8H  . hydrALAZINE  50 mg Oral Q8H  . lisinopril  20 mg Oral Daily   And  . hydrochlorothiazide  25 mg Oral Daily  . multivitamin with minerals  1 tablet Oral Daily  . rosuvastatin  5 mg Oral QHS  . sodium chloride flush  3 mL Intravenous Q12H   Continuous Infusions: . sodium chloride Stopped (11/21/17 0912)  .  ceFAZolin (ANCEF) IV     PRN Meds: albuterol, ALPRAZolam   Vital Signs    Vitals:   11/20/17 1656 11/20/17 1959 11/21/17 0602 11/21/17 0911  BP: (!) 180/64 (!) 157/65 (!) 158/59 (!) 153/59  Pulse: (!) 54 (!) 56 (!) 45   Resp: 14 16    Temp: 98.4 F (36.9 C) 97.8 F (36.6 C) 98.6 F (37 C)   TempSrc: Oral Oral Oral   SpO2: 96% 95% 100%   Weight: 76.2 kg  75.8 kg   Height: 5\' 4"  (1.626 m)       Intake/Output Summary (Last 24 hours) at 11/21/2017 0926 Last data filed at 11/21/2017 0857 Gross per 24 hour  Intake 236.78 ml  Output 1150 ml  Net -913.22 ml   Filed Weights   11/20/17 1656 11/21/17 0602  Weight: 76.2 kg 75.8 kg    Telemetry    SR but still periods of 2:1 block  - Personally Reviewed  ECG    SB  Rate 56 sinus pause LBBB - Personally Reviewed   Physical Exam  Ecchymosis over left eye  GEN: No acute distress.   Neck: No JVD Cardiac: RRR, no murmurs, rubs, or gallops.  Respiratory: Clear to auscultation bilaterally. GI: Soft, nontender, non-distended  MS: No edema; No deformity. Neuro:  Nonfocal  Psych: Normal affect   Labs    Chemistry Recent Labs  Lab 11/20/17 1014 11/21/17 0537  NA 138 142  K 3.6 4.4  CL 102 102  CO2 26 30  GLUCOSE 98 111*  BUN  12 13  CREATININE 0.97 0.93  CALCIUM 9.1 9.1  GFRNONAA 52* 55*  GFRAA >60 >60  ANIONGAP 10 10     Hematology Recent Labs  Lab 11/20/17 1014  WBC 6.5  RBC 5.67*  HGB 15.0  HCT 48.1*  MCV 84.8  MCH 26.5  MCHC 31.2  RDW 14.8  PLT 201    Cardiac EnzymesNo results for input(s): TROPONINI in the last 168 hours. No results for input(s): TROPIPOC in the last 168 hours.   BNPNo results for input(s): BNP, PROBNP in the last 168 hours.   DDimer No results for input(s): DDIMER in the last 168 hours.   Radiology    Ct Head Wo Contrast  Result Date: 11/20/2017 CLINICAL DATA:  Left forehead injury after syncope and fall today. No loss of consciousness. EXAM: CT HEAD WITHOUT CONTRAST TECHNIQUE: Contiguous axial images were obtained from the base of the skull through the vertex without intravenous contrast. COMPARISON:  None. FINDINGS: Brain: Mild chronic ischemic white matter disease is noted. No mass effect or midline shift is noted. Ventricular size is within normal limits.  There is no evidence of mass lesion, hemorrhage or acute infarction. Vascular: No hyperdense vessel or unexpected calcification. Skull: Normal. Negative for fracture or focal lesion. Sinuses/Orbits: No acute finding. Other: Small left frontal scalp hematoma is noted. IMPRESSION: Small left frontal scalp hematoma. Mild chronic ischemic white matter disease. No acute intracranial abnormality seen. Electronically Signed   By: Marijo Conception, M.D.   On: 11/20/2017 10:49   Dg Finger Thumb Right  Result Date: 11/20/2017 CLINICAL DATA:  Pain EXAM: RIGHT THUMB 2+V COMPARISON:  None. FINDINGS: Generalized osteopenia. No acute fracture or dislocation. 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 Mild osteoarthritis of the first [place] joint. No aggressive osseous lesion. IMPRESSION: No acute osseous injury of the right thumb. Electronically Signed   By: Kathreen Devoid   On: 11/20/2017 15:25    Cardiac Studies   TTE  pending   Patient Profile     84 y.o. female admitted with syncope Alternating BBB and 2:1 AV block was on lopressor on admission  Assessment & Plan    Syncope:  Dr Curt Bears seen this am and wanted to wait for lopressor to wash out thought rhythm was stable but To my eye still with periods of 2:1 block Patient is dizzy this am but I think it is from head trauma CT negative for Acute injury would watch over weekend and if AV block does not resolve off lopressor plan PPM Monday TTE Pending for today but has not had structural heart issues in past just trifasicular block and alternating BBB   For questions or updates, please contact [facility] Please consult [email or web address] for contact info under        Signed, [name], MD  11/21/2017, 9:26 AM

## 2017-11-21 NOTE — Progress Notes (Addendum)
Notified by CCMD that patient has been in 3HB intermittently. HR in the 30's non sustained. HR currently < 50.  Patient is asymptomatic. Card Fellow on call Dr. Bubba Camp made aware. Will continue to monitor.

## 2017-11-21 NOTE — Plan of Care (Signed)
  Problem: Activity: Goal: Risk for activity intolerance will decrease Outcome: Progressing   Problem: Coping: Goal: Level of anxiety will decrease Outcome: Progressing   

## 2017-11-22 DIAGNOSIS — R001 Bradycardia, unspecified: Secondary | ICD-10-CM

## 2017-11-22 DIAGNOSIS — R55 Syncope and collapse: Secondary | ICD-10-CM

## 2017-11-22 LAB — GLUCOSE, CAPILLARY: Glucose-Capillary: 108 mg/dL — ABNORMAL HIGH (ref 70–99)

## 2017-11-22 MED ORDER — BACITRACIN ZINC 500 UNIT/GM EX OINT
TOPICAL_OINTMENT | Freq: Two times a day (BID) | CUTANEOUS | Status: DC
  Administered 2017-11-22 – 2017-11-25 (×6): via TOPICAL
  Filled 2017-11-22: qty 28.4

## 2017-11-22 NOTE — Progress Notes (Signed)
Progress Note  Patient Name: ALIANAH LOFTON Date of Encounter: 11/22/2017  Primary Cardiologist: [name], MD   EP:  [facility]   Subjective   No CP   Breathing is OK   In Bed    Inpatient Medications    Scheduled Meds: . amLODipine  5 mg Oral Daily  . docusate sodium  200 mg Oral Daily  . escitalopram  2.5 mg Oral Daily  . heparin  5,000 Units Subcutaneous Q8H  . hydrALAZINE  50 mg Oral Q8H  . lisinopril  20 mg Oral Daily   And  . hydrochlorothiazide  25 mg Oral Daily  . hydrocortisone cream   Topical TID  . multivitamin with minerals  1 tablet Oral Daily  . polyethylene glycol  17 g Oral Daily  . rosuvastatin  5 mg Oral QHS  . sodium chloride flush  3 mL Intravenous Q12H   Continuous Infusions: . sodium chloride Stopped (11/21/17 0912)   PRN Meds: albuterol, ALPRAZolam   Vital Signs    Vitals:   11/21/17 0911 11/21/17 1513 11/21/17 2109 11/22/17 0546  BP: (!) 153/59 (!) 160/51 (!) 162/52 (!) 149/46  Pulse: (!) 54 (!) 49 (!) 44 (!) 40  Resp: 14  15 15   Temp:  98.5 F (36.9 C) 98.4 F (36.9 C) 98.6 F (37 C)  TempSrc:  Oral Oral Oral  SpO2: 100% 99% 98% 95%  Weight:    75.7 kg  Height:        Intake/Output Summary (Last 24 hours) at 11/22/2017 0758 Last data filed at 11/21/2017 1000 Gross per 24 hour  Intake 103 ml  Output 100 ml  Net 3 ml   Filed Weights   11/20/17 1656 11/21/17 0602 11/22/17 0546  Weight: 76.2 kg 75.8 kg 75.7 kg    Telemetry    High grade   Plus some possibe CHB  - Personally Reviewed  ECG    Physical Exam  Ecchymosis over left eye  GEN: No acute distress.   Neck: No JVD Cardiac: RRR, no murmurs, rubs, or gallops Respiratory: Clear to auscultation bilaterally. GI: Soft, nontender, non-distended  MS: No edema; No deformity. Neuro:  Nonfocal  Psych: Normal affect   Labs    Chemistry Recent Labs  Lab 11/20/17 1014 11/21/17 0537  NA 138 142  K 3.6 4.4  CL 102 102  CO2 26 30  GLUCOSE 98 111*  BUN 12 13    CREATININE 0.97 0.93  CALCIUM 9.1 9.1  GFRNONAA 52* 55*  GFRAA >60 >60  ANIONGAP 10 10     Hematology Recent Labs  Lab 11/20/17 1014  WBC 6.5  RBC 5.67*  HGB 15.0  HCT 48.1*  MCV 84.8  MCH 26.5  MCHC 31.2  RDW 14.8  PLT 201    Cardiac EnzymesNo results for input(s): TROPONINI in the last 168 hours. No results for input(s): TROPIPOC in the last 168 hours.   BNPNo results for input(s): BNP, PROBNP in the last 168 hours.   DDimer No results for input(s): DDIMER in the last 168 hours.   Radiology    Ct Head Wo Contrast  Result Date: 11/20/2017 CLINICAL DATA:  Left forehead injury after syncope and fall today. No loss of consciousness. EXAM: CT HEAD WITHOUT CONTRAST TECHNIQUE: Contiguous axial images were obtained from the base of the skull through the vertex without intravenous contrast. COMPARISON:  None. FINDINGS: Brain: Mild chronic ischemic white matter disease is noted. No mass effect or midline shift is noted. Ventricular  size is within normal limits. There is no evidence of mass lesion, hemorrhage or acute infarction. Vascular: No hyperdense vessel or unexpected calcification. Skull: Normal. Negative for fracture or focal lesion. Sinuses/Orbits: No acute finding. Other: Small left frontal scalp hematoma is noted. IMPRESSION: Small left frontal scalp hematoma. Mild chronic ischemic white matter disease. No acute intracranial abnormality seen. Electronically Signed   By: Marijo Conception, M.D.   On: 11/20/2017 10:49   Dg Finger Thumb Right  Result Date: 11/20/2017 CLINICAL DATA:  Pain EXAM: RIGHT THUMB 2+V COMPARISON:  None. FINDINGS: Generalized osteopenia. No acute fracture or dislocation. 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 Mild osteoarthritis of the first Killen joint. No aggressive osseous lesion. IMPRESSION: No acute osseous injury of the right thumb. Electronically Signed   By: Kathreen Devoid   On: 11/20/2017 15:25    Cardiac Studies   TTE  pending   Patient Profile     84 y.o. 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

## 2017-11-22 NOTE — Plan of Care (Signed)
  Problem: Activity: Goal: Risk for activity intolerance will decrease Outcome: Progressing   Problem: Coping: Goal: Level of anxiety will decrease Outcome: Progressing   

## 2017-11-22 NOTE — Consult Note (Signed)
[place] Nurse wound consult note Reason for Consult: left orbit laceration and mid sternal partial thickness wound related to medical adhesive removal Wound type:Trauma, MARSI (Medical adhesive related skin injury) Pressure Injury POA: NA Measurement:2.2cm closely approximated laceration at the left orbit. 6 sutures. 3cm x 2.4cm x 0.2cm area of skin loss following adhesive removal Wound VQW:QVLD moist Drainage (amount, consistency, odor) scant serous Periwound:intact, dry (eccymosis and edematous at left orbit secondary to fall) Dressing procedure/placement/frequency: I will provide Nursing with guidance via the Orders for topical care of the left eye laceration (bacitracin) and the partial thickness chest wound (white petrolatum).  POC communicated to the bedside RN and to the patient.  [place] nursing team will not follow, but will remain available to this patient, the nursing and medical teams.  Please re-consult if needed. Thanks, Maudie Flakes, MSN, RN, [place], Arther Abbott  Pager# [phone]

## 2017-11-23 LAB — GLUCOSE, CAPILLARY: GLUCOSE-CAPILLARY: 111 mg/dL — AB (ref 70–99)

## 2017-11-23 NOTE — Progress Notes (Signed)
Progress Note  Patient Name: Lauren Holloway Date of Encounter: 11/23/2017  Primary Cardiologist: [name], MD   EP:  [place]   Subjective   No dizzines   No SOB  No CP     Inpatient Medications    Scheduled Meds: . amLODipine  5 mg Oral Daily  . bacitracin   Topical BID  . docusate sodium  200 mg Oral Daily  . escitalopram  2.5 mg Oral Daily  . heparin  5,000 Units Subcutaneous Q8H  . hydrALAZINE  50 mg Oral Q8H  . lisinopril  20 mg Oral Daily   And  . hydrochlorothiazide  25 mg Oral Daily  . hydrocortisone cream   Topical TID  . multivitamin with minerals  1 tablet Oral Daily  . polyethylene glycol  17 g Oral Daily  . rosuvastatin  5 mg Oral QHS  . sodium chloride flush  3 mL Intravenous Q12H   Continuous Infusions: . sodium chloride Stopped (11/21/17 0912)   PRN Meds: albuterol, ALPRAZolam   Vital Signs    Vitals:   11/22/17 0546 11/22/17 1449 11/22/17 2059 11/23/17 0517  BP: (!) 149/46 (!) 167/53 (!) 159/58 (!) 150/56  Pulse: (!) 40 (!) 46 (!) 55 (!) 49  Resp: 15  15 15   Temp: 98.6 F (37 C) 98.7 F (37.1 C) 98.9 F (37.2 C) 98.6 F (37 C)  TempSrc: Oral Oral Oral Oral  SpO2: 95% 96% 93% 95%  Weight: 75.7 kg   74.8 kg  Height:       No intake or output data in the 24 hours ending 11/23/17 0827 Filed Weights   11/21/17 0602 11/22/17 0546 11/23/17 0517  Weight: 75.8 kg 75.7 kg 74.8 kg    Telemetry    High grade   Plus some possibe CHB  - Personally Reviewed  ECG    Physical Exam  Ecchymosis over left eye  GEN: No acute distress.   Neck: JVP is normal   Cardiac: RRR, no murmurs, rubs, or gallops Respiratory: Clear to auscultation bilaterally. GI: Soft, nontender, non-distended  MS: No edema; No deformity. Neuro:  Nonfocal  Psych: Normal affect   Labs    Chemistry Recent Labs  Lab 11/20/17 1014 11/21/17 0537  NA 138 142  K 3.6 4.4  CL 102 102  CO2 26 30  GLUCOSE 98 111*  BUN 12 13  CREATININE 0.97 0.93  CALCIUM 9.1 9.1    GFRNONAA 52* 55*  GFRAA >60 >60  ANIONGAP 10 10     Hematology Recent Labs  Lab 11/20/17 1014  WBC 6.5  RBC 5.67*  HGB 15.0  HCT 48.1*  MCV 84.8  MCH 26.5  MCHC 31.2  RDW 14.8  PLT 201    Cardiac EnzymesNo results for input(s): TROPONINI in the last 168 hours. No results for input(s): TROPIPOC in the last 168 hours.   BNPNo results for input(s): BNP, PROBNP in the last 168 hours.   DDimer No results for input(s): DDIMER in the last 168 hours.   Radiology    No results found.  Cardiac Studies   TTE pending   Patient Profile     84 y.o. 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

## 2017-11-24 ENCOUNTER — Encounter (HOSPITAL_COMMUNITY): Payer: Self-pay | Admitting: Internal Medicine

## 2017-11-24 ENCOUNTER — Inpatient Hospital Stay (HOSPITAL_COMMUNITY)
Admission: EM | Disposition: A | Discharge status: Home Health | Payer: Self-pay | Source: Home / Self Care | Attending: Cardiovascular Disease

## 2017-11-24 HISTORY — PX: PACEMAKER IMPLANT: EP1218

## 2017-11-24 LAB — GLUCOSE, CAPILLARY: GLUCOSE-CAPILLARY: 141 mg/dL — AB (ref 70–99)

## 2017-11-24 SURGERY — PACEMAKER IMPLANT
Anesthesia: LOCAL

## 2017-11-24 MED ORDER — LIDOCAINE HCL (PF) 1 % IJ SOLN
INTRAMUSCULAR | Status: DC | PRN
  Administered 2017-11-24: 40 mL

## 2017-11-24 MED ORDER — ACETAMINOPHEN 325 MG PO TABS
325.0000 mg | ORAL_TABLET | ORAL | Status: DC | PRN
  Administered 2017-11-24 – 2017-11-25 (×2): 650 mg via ORAL
  Filled 2017-11-24 (×2): qty 2

## 2017-11-24 MED ORDER — MIDAZOLAM HCL 5 MG/5ML IJ SOLN
INTRAMUSCULAR | Status: DC | PRN
  Administered 2017-11-24 (×4): 1 mg via INTRAVENOUS

## 2017-11-24 MED ORDER — ONDANSETRON HCL 4 MG/2ML IJ SOLN
INTRAMUSCULAR | Status: AC
  Filled 2017-11-24: qty 2

## 2017-11-24 MED ORDER — FENTANYL CITRATE (PF) 100 MCG/2ML IJ SOLN
INTRAMUSCULAR | Status: DC | PRN
  Administered 2017-11-24 (×2): 12.5 ug via INTRAVENOUS
  Administered 2017-11-24: 25 ug via INTRAVENOUS

## 2017-11-24 MED ORDER — FENTANYL CITRATE (PF) 100 MCG/2ML IJ SOLN
INTRAMUSCULAR | Status: AC
  Filled 2017-11-24: qty 2

## 2017-11-24 MED ORDER — ONDANSETRON HCL 4 MG/2ML IJ SOLN
INTRAMUSCULAR | Status: DC | PRN
  Administered 2017-11-24: 4 mg via INTRAVENOUS

## 2017-11-24 MED ORDER — MIDAZOLAM HCL 5 MG/5ML IJ SOLN
INTRAMUSCULAR | Status: AC
  Filled 2017-11-24: qty 5

## 2017-11-24 MED ORDER — LIDOCAINE HCL (PF) 1 % IJ SOLN
INTRAMUSCULAR | Status: AC
  Filled 2017-11-24: qty 60

## 2017-11-24 MED ORDER — CEFAZOLIN SODIUM-DEXTROSE 2-4 GM/100ML-% IV SOLN
2.0000 g | INTRAVENOUS | Status: AC
  Administered 2017-11-24: 2 g via INTRAVENOUS

## 2017-11-24 MED ORDER — SODIUM CHLORIDE 0.9 % IV SOLN
INTRAVENOUS | Status: AC
  Filled 2017-11-24: qty 2

## 2017-11-24 MED ORDER — SODIUM CHLORIDE 0.9 % IV SOLN
80.0000 mg | INTRAVENOUS | Status: AC
  Administered 2017-11-24: 80 mg
  Filled 2017-11-24: qty 2

## 2017-11-24 MED ORDER — SODIUM CHLORIDE 0.9 % IV SOLN: INTRAVENOUS | Status: DC

## 2017-11-24 MED ORDER — ONDANSETRON HCL 4 MG/2ML IJ SOLN: 4.0000 mg | Freq: Four times a day (QID) | INTRAMUSCULAR | Status: DC | PRN

## 2017-11-24 MED ORDER — CEFAZOLIN SODIUM-DEXTROSE 1-4 GM/50ML-% IV SOLN
1.0000 g | Freq: Four times a day (QID) | INTRAVENOUS | Status: AC
  Administered 2017-11-24 – 2017-11-25 (×3): 1 g via INTRAVENOUS
  Filled 2017-11-24 (×3): qty 50

## 2017-11-24 MED ORDER — CEFAZOLIN SODIUM-DEXTROSE 2-4 GM/100ML-% IV SOLN
INTRAVENOUS | Status: AC
  Filled 2017-11-24: qty 100

## 2017-11-24 MED ORDER — HEPARIN (PORCINE) IN NACL 1000-0.9 UT/500ML-% IV SOLN
INTRAVENOUS | Status: AC
  Filled 2017-11-24: qty 500

## 2017-11-24 SURGICAL SUPPLY — 12 items
CABLE SURGICAL S-101-97-12 (CABLE) ×2 IMPLANT
CATH RIGHTSITE C315HIS02 (CATHETERS) ×2 IMPLANT
IPG PACE AZUR XT DR MRI W1DR01 (Pacemaker) ×1 IMPLANT
LEAD CAPSURE NOVUS 5076-52CM (Lead) ×2 IMPLANT
LEAD SELECT SECURE 3830 383069 (Lead) ×1 IMPLANT
PACE AZURE XT DR MRI W1DR01 (Pacemaker) ×2 IMPLANT
PAD PRO RADIOLUCENT 2001M-C (PAD) ×2 IMPLANT
SELECT SECURE 3830 383069 (Lead) ×2 IMPLANT
SHEATH CLASSIC 7F (SHEATH) ×4 IMPLANT
SLITTER 6232ADJ (MISCELLANEOUS) ×2 IMPLANT
TRAY PACEMAKER INSERTION (PACKS) ×2 IMPLANT
WIRE HI TORQ VERSACORE-J 145CM (WIRE) ×2 IMPLANT

## 2017-11-24 NOTE — Progress Notes (Addendum)
Progress Note  Patient Name: Lauren Holloway Date of Encounter: 11/24/2017  Primary Cardiologist: [name], MD   Subjective   Feels ok, minimal discomfort at L forehead, no CP or SOB  Inpatient Medications    Scheduled Meds: . amLODipine  5 mg Oral Daily  . bacitracin   Topical BID  . docusate sodium  200 mg Oral Daily  . escitalopram  2.5 mg Oral Daily  . gentamicin irrigation  80 mg Irrigation To Cath  . heparin  5,000 Units Subcutaneous Q8H  . hydrALAZINE  50 mg Oral Q8H  . lisinopril  20 mg Oral Daily   And  . hydrochlorothiazide  25 mg Oral Daily  . hydrocortisone cream   Topical TID  . multivitamin with minerals  1 tablet Oral Daily  . polyethylene glycol  17 g Oral Daily  . rosuvastatin  5 mg Oral QHS  . sodium chloride flush  3 mL Intravenous Q12H   Continuous Infusions: . sodium chloride    .  ceFAZolin (ANCEF) IV     PRN Meds: albuterol, ALPRAZolam   Vital Signs    Vitals:   11/23/17 1049 11/23/17 1353 11/23/17 1957 11/24/17 0432  BP: (!) 145/55 (!) 154/57 (!) 157/62 (!) 163/55  Pulse:  (!) 48 (!) 48 (!) 46  Resp:   17 15  Temp:  (!) 97.4 F (36.3 C) 98.7 F (37.1 C) 98.5 F (36.9 C)  TempSrc:  Oral Oral Oral  SpO2:  91% 96% 95%  Weight:    75 kg  Height:        Intake/Output Summary (Last 24 hours) at 11/24/2017 0847 Last data filed at 11/24/2017 0616 Gross per 24 hour  Intake 600 ml  Output -  Net 600 ml   Filed Weights   11/22/17 0546 11/23/17 0517 11/24/17 0432  Weight: 75.7 kg 74.8 kg 75 kg    Telemetry    Generally 2:1 AVBlock, V rates 40's, no symptoms supine/rest - Personally Reviewed  ECG    No new EKGs - Personally Reviewed  Physical Exam   GEN: No acute distress.   Neck: No JVD Cardiac: RRR, bradycardic no murmurs, rubs, or gallops.  Respiratory: CTA b/l GI: Soft, nontender, non-distended  MS: No edema; No deformity. Neuro:  Nonfocal  Psych: Normal affect   Labs    Chemistry Recent Labs  Lab  11/20/17 1014 11/21/17 0537  NA 138 142  K 3.6 4.4  CL 102 102  CO2 26 30  GLUCOSE 98 111*  BUN 12 13  CREATININE 0.97 0.93  CALCIUM 9.1 9.1  GFRNONAA 52* 55*  GFRAA >60 >60  ANIONGAP 10 10     Hematology Recent Labs  Lab 11/20/17 1014  WBC 6.5  RBC 5.67*  HGB 15.0  HCT 48.1*  MCV 84.8  MCH 26.5  MCHC 31.2  RDW 14.8  PLT 201    Cardiac EnzymesNo results for input(s): TROPONINI in the last 168 hours. No results for input(s): TROPIPOC in the last 168 hours.   BNPNo results for input(s): BNP, PROBNP in the last 168 hours.   DDimer No results for input(s): DDIMER in the last 168 hours.   Radiology    No results found.  Cardiac Studies   11/21/17: TTE Study Conclusions - Left ventricle: The cavity size was normal. Wall thickness was   increased in a pattern of mild LVH. Systolic function was   vigorous. The estimated ejection fraction was in the range of 65%  to 70%. Wall motion was normal; there were no regional wall   motion abnormalities. The study is not technically sufficient to   allow evaluation of LV diastolic function. - Aortic valve: There was trivial regurgitation. - Mitral valve: There was mild regurgitation. - Pulmonary arteries: Systolic pressure was moderately increased.   PA peak pressure: 53 mm Hg (S). Impressions: - Definity used; vigorous LV systolic function; mild LVH; trace AI;   mild MR; trace TR with moderate pulmonary hypertension.  Patient Profile     84 y.o. female w/PMHx of HTN, asthma, LBBB (historically has been observed to have alternating BBB), HLD, OA admitted to [facility] with progressive weakness and syncopal event with trauma, noted to be bradycardic by EMS in 40's, noted on monitor here to have episodes of 2:1 heart block with rates 40's  She was admitted and home metoprolol held. (11/20/17)  Assessment & Plan    1. Syncope 2. Advanced heart block     Persists despite no BB (>48hours)     For PPM implant today  I  discussed PPM implant procedure, potential risks/benefits, she would like to proceed. Dr. Lovena Le to see  3. HTN     Resume BB post pacer    For questions or updates, please contact [place] Please consult [email or web address] for contact info under        Signed, Baldwin [place], PA-C  11/24/2017, 8:47 AM    EP Attending  Patient seen and examined. Agree with above. She presents again with 2:1 AV block and syncope. I have discussed the indications/risk/benefits/goals/expectations of PPM insertion and she wishes to proceed.  Mikle Bosworth.D.

## 2017-11-25 ENCOUNTER — Inpatient Hospital Stay (HOSPITAL_COMMUNITY): Payer: Medicare Other

## 2017-11-25 LAB — GLUCOSE, CAPILLARY: GLUCOSE-CAPILLARY: 114 mg/dL — AB (ref 70–99)

## 2017-11-25 MED FILL — Heparin Sod (Porcine)-NaCl IV Soln 1000 Unit/500ML-0.9%: INTRAVENOUS | Qty: 500 | Status: AC

## 2017-11-25 NOTE — Care Management Note (Signed)
Case Management Note  Patient Details  Name: Lauren Holloway MRN: [id number] Date of Birth: March 07, 1931  Subjective/Objective:  Pt presented for syncope- advanced heart block. Pt is from home alone- states daughter stays with her Tuesdays and Thursdays. Pt has DME Cane at home.              Action/Plan: PT recommendations for [facility] PT Services no DME needed. Agency list provided to patient and patient chose [facility] for Services. Referral called to [facility] with [facility] and SOC to begin within 24-48 hours post transition home. No further needs from CM at this time.   Expected Discharge Date:  11/25/17               Expected Discharge Plan:  [place]  In-House Referral:  NA  Discharge planning Services  CM Consult  Post Acute Care Choice:  Home Health Choice offered to:  Patient  DME Arranged:  N/A DME Agency:  NA  HH Arranged:  PT [place] Agency:  [place]  Status of Service:  Completed, signed off  If discussed at [place] of Stay Meetings, dates discussed:    Additional Comments:  Bethena Roys, RN 11/25/2017, 11:13 AM

## 2017-11-25 NOTE — Discharge Instructions (Signed)
YOU NEED TO SEE YOUR PRIMARY DOCTOR BY Friday 11/28/17 FOR FOLLOW UP AND SUTURE (STITCHES) REMOVAL.  THEY SHOULD BE CALLING YOU TO SCHEDULE, PLEASE FOLLOW UP AND CALL IF THEY HAVE NOT REACHED OUT TO YOU BY TOMORROW TO SCHEDULE       Supplemental Discharge Instructions for  Pacemaker/Defibrillator Patients  Activity No heavy lifting or vigorous activity with your left/right arm for 6 to 8 weeks.  Do not raise your left/right arm above your head for one week.  Gradually raise your affected arm as drawn below.              11/28/17                    11/29/17                    11/30/17                   12/01/17 __  NO DRIVING until 04/26/95  WOUND CARE - Keep the wound area clean and dry.  Do not get this area wet for one week. No showers for one week; you may shower on 12/01/17  . - The tape/steri-strips on your wound will fall off; do not pull them off.  No bandage is needed on the site.  DO  NOT apply any creams, oils, or ointments to the wound area. - If you notice any drainage or discharge from the wound, any swelling or bruising at the site, or you develop a fever > 101? F after you are discharged home, call the office at once.  Special Instructions - You are still able to use cellular telephones; use the ear opposite the side where you have your pacemaker/defibrillator.  Avoid carrying your cellular phone near your device. - When traveling through airports, show security personnel your identification card to avoid being screened in the metal detectors.  Ask the security personnel to use the hand wand. - Avoid arc welding equipment, MRI testing (magnetic resonance imaging), TENS units (transcutaneous nerve stimulators).  Call the office for questions about other devices. - Avoid electrical appliances that are in poor condition or are not properly grounded. - Microwave ovens are safe to be near or to operate.  Additional information for defibrillator patients should your device go  off: - If your device goes off ONCE and you feel fine afterward, notify the device clinic nurses. - If your device goes off ONCE and you do not feel well afterward, call 911. - If your device goes off TWICE, call 911. - If your device goes off THREE times in one day, call 911.  DO NOT DRIVE YOURSELF OR A FAMILY MEMBER WITH A DEFIBRILLATOR TO THE HOSPITAL--CALL 911.

## 2017-11-25 NOTE — Plan of Care (Signed)

## 2017-11-25 NOTE — Evaluation (Signed)
Physical Therapy Evaluation Patient Details Name: Lauren Holloway MRN: [id number] DOB: August 06, 1931 Today's Date: 11/25/2017   History of Present Illness  Pt is an 84 y/o female admitted after dizziness and falling. Found to have AV heart block and is s/p L sided pacemaker placement on 9/23. PMH includes HTN, asthma, and LBBB.   Clinical Impression  Pt admitted secondary to problem above with deficits below. Pt presenting with generalized weakness and unsteadiness during gait. Requiring min to min guard A with mobility tasks. Balance improved with use of hand rail and HHA. Educated pt to use cane at home to increase safety with mobility. Reports daughter and nephew can assist at d/c and pt is eager to d/c home. Will continue to follow acutely to maximize functional mobility independence and safety.     Follow Up Recommendations Home health PT;Supervision for mobility/OOB    Equipment Recommendations  None recommended by PT    Recommendations for Other Services       Precautions / Restrictions Precautions Precautions: ICD/Pacemaker;Fall Precaution Comments: Reviewed pacemaker precautions with pt. Pt able to maintain throughout mobility. Restrictions Weight Bearing Restrictions: No      Mobility  Bed Mobility Overal bed mobility: Needs Assistance Bed Mobility: Rolling;Sidelying to Sit Rolling: Supervision Sidelying to sit: Min guard       General bed mobility comments: Min Guard A for safety as pt came up to sitting.   Transfers Overall transfer level: Needs assistance Equipment used: None Transfers: Sit to/from Stand Sit to Stand: Min assist         General transfer comment: Min A for steadying assist.   Ambulation/Gait Ambulation/Gait assistance: Min assist;Min guard Gait Distance (Feet): 100 Feet Assistive device: None;1 person hand held assist Gait Pattern/deviations: Step-through pattern;Decreased stride length Gait velocity: Decreased    General Gait Details:  Slow, unsteady gait initially without AD. Requiring min A for steadying. Improved steadiness noted with use of hand rails and HHA. Educated pt about use of cane at home to improve safety with mobility. Pt agreeable.   Stairs            Wheelchair Mobility    Modified Rankin (Stroke Patients Only)       Balance Overall balance assessment: Needs assistance Sitting-balance support: No upper extremity supported;Feet supported Sitting balance-Leahy Scale: Good     Standing balance support: Single extremity supported;No upper extremity supported;During functional activity Standing balance-Leahy Scale: Fair Standing balance comment: Able to maintain static standing without UE support.                              Pertinent Vitals/Pain Pain Assessment: 0-10 Pain Score: 8  Pain Location: L eye, incision site  Pain Descriptors / Indicators: Aching;Sore Pain Intervention(s): Limited activity within patient's tolerance;Monitored during session;Repositioned    Home Living Family/patient expects to be discharged to:: Private residence Living Arrangements: Children Available Help at Discharge: Family;Available PRN/intermittently Type of Home: House Home Access: Stairs to enter Entrance Stairs-Rails: None Entrance Stairs-Number of Steps: 1(threshold ) Home Layout: One level Home Equipment: Clinical cytogeneticist - 2 wheels;Cane - single point      Prior Function Level of Independence: Independent         Comments: Still driving      Hand Dominance   Dominant Hand: Right    Extremity/Trunk Assessment   Upper Extremity Assessment Upper Extremity Assessment: LUE deficits/detail LUE Deficits / Details: LUE limited by pacemaker precautions.  Lower Extremity Assessment Lower Extremity Assessment: Generalized weakness    Cervical / Trunk Assessment Cervical / Trunk Assessment: Normal  Communication   Communication: No difficulties  Cognition  Arousal/Alertness: Awake/alert Behavior During Therapy: WFL for tasks assessed/performed Overall Cognitive Status: Within Functional Limits for tasks assessed                                        General Comments General comments (skin integrity, edema, etc.): Educated pt about need for assist at home. Reports daughter and nephew can assist.     Exercises     Assessment/Plan    PT Assessment Patient needs continued PT services  PT Problem List Decreased strength;Decreased balance;Decreased knowledge of use of DME;Decreased knowledge of precautions;Decreased mobility;Pain       PT Treatment Interventions DME instruction;Gait training;Stair training;Functional mobility training;Therapeutic activities;Therapeutic exercise;Balance training;Patient/family education    PT Goals (Current goals can be found in the Care Plan section)  Acute Rehab PT Goals Patient Stated Goal: to go home and sleep in my own bed  PT Goal Formulation: With patient Time For Goal Achievement: 12/09/17 Potential to Achieve Goals: Good    Frequency Min 3X/week   Barriers to discharge        Co-evaluation               AM-PAC PT "6 Clicks" Daily Activity  Outcome Measure Difficulty turning over in bed (including adjusting bedclothes, sheets and blankets)?: A Little Difficulty moving from lying on back to sitting on the side of the bed? : Unable Difficulty sitting down on and standing up from a chair with arms (e.g., wheelchair, bedside commode, etc,.)?: Unable Help needed moving to and from a bed to chair (including a wheelchair)?: A Little Help needed walking in hospital room?: A Little Help needed climbing 3-5 steps with a railing? : A Lot 6 Click Score: 13    End of Session Equipment Utilized During Treatment: Gait belt Activity Tolerance: Patient tolerated treatment well Patient left: in chair;with call bell/phone within reach;with chair alarm set Nurse Communication:  Mobility status PT Visit Diagnosis: Unsteadiness on feet (R26.81);Muscle weakness (generalized) (M62.81)    Time: 5701-7793 PT Time Calculation (min) (ACUTE ONLY): 23 min   Charges:   PT Evaluation $PT Eval Low Complexity: 1 Low PT Treatments $Gait Training: 8-22 mins        Leighton Ruff, PT, DPT  Acute Rehabilitation Services  Pager: [phone] Office: [phone]   Rudean Hitt 11/25/2017, 11:15 AM

## 2017-11-25 NOTE — Discharge Summary (Addendum)
ELECTROPHYSIOLOGY PROCEDURE DISCHARGE SUMMARY    Patient ID: Lauren Holloway,  MRN: [id number], DOB/AGE: Nov 18, 1931 84 y.o.  Admit date: 11/20/2017 Discharge date: 11/25/2017  Primary Care Physician: [name], MD  Primary Cardiologist: Dr. Johnsie Cancel Electrophysiologist: new to Dr. Lovena Le this admission  Primary Discharge Diagnosis:  1. Syncope/near syncope 2. Advanced heart block  Secondary Discharge Diagnosis:  1. HTN 2. Asthma 3. HLD  Allergies  Allergen Reactions  . Sulfonamide Derivatives     HIVES      Procedures This Admission:  1.  Implantation of a MDt dual  chamber PPM on 11/24/17 by Dr Lovena Le.  The patient received a medtronic (serial number [id number]) pacemaker, medtronic (serial number [id number]) right atrial lead and a medtronic (serial number [id number]) right ventricular lead  There were no immediate post procedure complications. 2.  CXR on 11/25/17 demonstrated no pneumothorax status post device implantation.   Brief HPI: Lauren Holloway is a 84 y.o. female was admitted to [facility] with c/o progressive weakness, fatigue and a syncopal event with trauma.  Hospital Course:  The patient presented to [facility] Emergency department after sx of pre-syncope and subsequent fall (no LOC). She reportedly had gone to the hair salon and felt dizzy on her way to the shampoo sink. She "fell out" at that time, hitting her head and sustaining a small laceration to the left side of forehead. EMS was called, and upon EMS arrival, the patient was noted to be bradycardic with HR in the 40s. She received IVF in the field with HR improving to 70s. Per the patient's report, her BP medication had recently changed with addition of HCTZ (?patient reports this was added as HCTZ 25mg  po TID and not taken as a combo medication with Lisinopril - record of this not found at this time in [place] / CareEverywhere).  On arrival to the ED, the patient received sutures for her forehead laceration  and proceeded for a head CT. During the head CT, a RN noted patient's HR 30s and noted possible 2nd degree block but was unable to obtain EKG at that time. Patient stated she was asx during this event, and following the event, her HR returned to 62bpm. Shortly thereafter, she reportedly successfully ambulated with assistance and steady gate. Follow-up EKG showed sinus pause (provider listed as Dr. Venora Maples), captured under the ECG tab.   EKG 11:12AM: bradycardic sinus rhythm at 56 bpm with sinus pause and existing left bundle branch block, QTc 485. Labs: Na 138, K 3.6, glucose 98, Cr 0.97, Ca 9.1, WBC 6.5, Hgb 15.0, plts 201 Vitals: HR 39-59bpm, BP 213-086 systolic / 57-84 diastolic. SpO2 95-96%; RR 16-22, T 98.27F CT head without intracranial abnormality  She was admitted, her home metoprolol held.  Despite being off the betablocker, she was observed ultimately in telemetry to develop consistent high AV block 2:1, with episodes of transient CHB, V rates remained 40's, without symptoms in bed.  TTE done this admission noted LVEF 65-70%, no WMA, no significant VHD, PA 11mmHg.  Without reversible cause noted she then underwent implantation of a PPM with details as outlined above. She was monitored on telemetry overnight which demonstrated SR/VP.  Left chest was without hematoma or ecchymosis.  The device was interrogated and found to be functioning normally.  CXR was obtained and demonstrated no pneumothorax status post device implantation.  Wound care (for both sites), arm mobility, and restrictions were reviewed with the patient.  The patient was examined by Dr. Lovena Le and  considered stable for discharge to home.   I called to try and facilitate follow up with her PMD, unfortunately on both occassions was unable to speak with the person who could schedule the visit.  I left 2 messages, and was assured the patient would be called to make the appointment.  I have asked the patient as well to call and follow  up.  Patient concerned about home with some physical restrictions even though temporary PT eval was obtained and recommended HHPT.  Patient was agreeable.  Physical Exam: Vitals:   11/24/17 1405 11/24/17 2147 11/25/17 0511 11/25/17 0800  BP: 139/69 (!) 147/67 (!) 141/67 (!) 167/71  Pulse:  91 85 91  Resp:  20  (!) 24  Temp:  99.4 F (37.4 C) 97.8 F (36.6 C)   TempSrc:  Oral Oral   SpO2:  94% 94% 94%  Weight:   70.9 kg   Height:        GEN- The patient is well appearing, alert and oriented x 3 today.   HEENT: normocephalic; sclera clear, conjunctiva pink; hearing intact; oropharynx clear; neck supple, no JVP R forehead/lateral orbital ridge, dressing is dry, ecchymosis noted, sutures are intact, clean, dry Lungs- CTA b/l, normal work of breathing.  No wheezes, rales, rhonchi Heart- RRR, no murmurs, rubs or gallops, PMI not laterally displaced GI- soft, non-tender, non-distended Extremities- no clubbing, cyanosis, or edema MS- no significant deformity or atrophy Skin- warm and dry, no rash or lesion, left chest without hematoma/ecchymosis Psych- euthymic mood, full affect Neuro- no gross deficits   Labs:   Lab Results  Component Value Date   WBC 6.5 11/20/2017   HGB 15.0 11/20/2017   HCT 48.1 (H) 11/20/2017   MCV 84.8 11/20/2017   PLT 201 11/20/2017    Recent Labs  Lab 11/21/17 0537  NA 142  K 4.4  CL 102  CO2 30  BUN 13  CREATININE 0.93  CALCIUM 9.1  GLUCOSE 111*    Discharge Medications:  Allergies as of 11/25/2017      Reactions   Sulfonamide Derivatives    HIVES      Medication List    TAKE these medications   ALPRAZolam 0.5 MG tablet Commonly known as:  XANAX Take 0.25 mg by mouth 2 (two) times daily as needed for anxiety or sleep.   amLODipine 5 MG tablet Commonly known as:  NORVASC Take 5 mg by mouth daily.   escitalopram 5 MG tablet Commonly known as:  LEXAPRO Take 2.5 mg by mouth daily.   FISH OIL + D3 PO Take 1 tablet by mouth  daily.   FLUAD 0.5 ML Susy Generic drug:  Influenza Vac A&B Surf Ant Adj Inject 0.5 mLs into the skin once. 10/21/17   ibuprofen 400 MG tablet Commonly known as:  ADVIL,MOTRIN Take 400 mg by mouth every 6 (six) hours as needed for headache, mild pain or moderate pain.   lisinopril-hydrochlorothiazide 20-25 MG tablet Commonly known as:  PRINZIDE,ZESTORETIC Take 1 tablet by mouth daily.   metoprolol tartrate 25 MG tablet Commonly known as:  LOPRESSOR Take 25 mg by mouth 2 (two) times daily.   multivitamin with minerals tablet Take 1 tablet by mouth daily.   rosuvastatin 5 MG tablet Commonly known as:  CRESTOR Take 5 mg by mouth at bedtime.   VENTOLIN HFA 108 (90 Base) MCG/ACT inhaler Generic drug:  albuterol Inhale 1 puff into the lungs 2 (two) times daily as needed for wheezing.  Disposition:  Home Discharge Instructions    Diet - low sodium heart healthy   Complete by:  As directed    Increase activity slowly   Complete by:  As directed      Follow-up Information    Schedule an appointment as soon as possible for a visit with [name], MD.   Specialty:  Internal Medicine Why:  You should be called to make an appointment from the office.  You need to be seen by Friday 12/01/17 for follow up and suture removal.  If you are not called please call to follow up Contact information: [place] [address] [phone]        [place] Office Follow up on 12/04/2017.   Specialty:  Cardiology Why:  12:00PM, (noon), wound check visit Contact information: [address] [place]       Evans Lance, MD Follow up in 3 month(s).   Specialty:  Cardiology Why:  You will be called by Dr. Tanna Furry scheduler to make a follow up appointment with him Contact information: [address] [place] [address] [phone]           Duration of Discharge  Encounter: Greater than 30 minutes including physician time.  Venetia Night, PA-C 11/25/2017 9:53 AM  EP Attending  Patient seen and examined. Agree with above. The patient is doing well after PPM insertion. Device interogation demonstrates normal PM function. Usual followup.  Mikle Bosworth.D.

## 2017-11-25 NOTE — Care Management Important Message (Signed)
Important Message  Patient Details  Name: GENEVEIVE FURNESS MRN: [id number] Date of Birth: 1931/09/13   Medicare Important Message Given:  Yes    Barb Merino Kyiesha Millward 11/25/2017, 3:34 PM

## 2017-12-04 ENCOUNTER — Ambulatory Visit (INDEPENDENT_AMBULATORY_CARE_PROVIDER_SITE_OTHER): Payer: Medicare Other | Admitting: *Deleted

## 2017-12-04 DIAGNOSIS — I441 Atrioventricular block, second degree: Secondary | ICD-10-CM

## 2017-12-04 DIAGNOSIS — Z950 Presence of cardiac pacemaker: Secondary | ICD-10-CM | POA: Diagnosis not present

## 2017-12-04 LAB — CUP PACEART INCLINIC DEVICE CHECK
Battery Remaining Longevity: 93 mo
Brady Statistic AP VS Percent: 0.46 %
Brady Statistic AS VP Percent: 51.52 %
Brady Statistic AS VS Percent: 47.54 %
Brady Statistic RV Percent Paced: 52 %
Implantable Lead Implant Date: 20190923
Implantable Lead Location: 753860
Implantable Lead Model: 3830
Implantable Lead Model: 5076
Implantable Pulse Generator Implant Date: 20190923
Lead Channel Impedance Value: 323 Ohm
Lead Channel Impedance Value: 323 Ohm
Lead Channel Impedance Value: 399 Ohm
Lead Channel Pacing Threshold Amplitude: 0.75 V
Lead Channel Pacing Threshold Pulse Width: 0.4 ms
Lead Channel Pacing Threshold Pulse Width: 1 ms
Lead Channel Sensing Intrinsic Amplitude: 1.375 mV
Lead Channel Sensing Intrinsic Amplitude: 12.375 mV
Lead Channel Setting Pacing Amplitude: 3.5 V
Lead Channel Setting Pacing Amplitude: 3.5 V
Lead Channel Setting Sensing Sensitivity: 1.2 mV
MDC IDC LEAD IMPLANT DT: 20190923
MDC IDC LEAD LOCATION: 753859
MDC IDC MSMT BATTERY VOLTAGE: 3.2 V
MDC IDC MSMT LEADCHNL RA PACING THRESHOLD AMPLITUDE: 0.75 V
MDC IDC MSMT LEADCHNL RV IMPEDANCE VALUE: 437 Ohm
MDC IDC SESS DTM: 20191003134753
MDC IDC SET LEADCHNL RV PACING PULSEWIDTH: 1 ms
MDC IDC STAT BRADY AP VP PERCENT: 0.49 %
MDC IDC STAT BRADY RA PERCENT PACED: 1.04 %

## 2017-12-04 NOTE — Progress Notes (Signed)
Wound check appointment. Steri-strips removed. Wound without redness or edema. Incision edges approximated, wound well healed. Normal device function. Thresholds, sensing, and impedances consistent with implant measurements. RV (His) capture appears non-selective/septal until LOC at 0.5V @ 1.32ms. Device programmed at 3.5V with RA auto capture programmed on (RV on monitor only) for extra safety margin until 3 month visit. Histogram distribution appropriate for patient and level of activity. No mode switches or high ventricular rates noted. AT/AF daily burden alert turned on, RV capture high threshold alert turned on. Patient and niece educated about wound care, arm mobility, lifting restrictions, and Carelink monitor. ROV with GT on 03/10/18.

## 2018-01-02 ENCOUNTER — Telehealth: Payer: Self-pay | Admitting: Internal Medicine

## 2018-01-02 NOTE — Telephone Encounter (Signed)
STAT if patient feels like he/she is going to faint   1) Are you dizzy now? Yes somewhat  2) Do you feel faint or have you passed out? do  3) Do you have any other symptoms?no  4) Have you checked your HR and BP (record if available)? Yes- blood pressure was  156/72 on Wednesday, did not take it yesterday and today

## 2018-01-02 NOTE — Telephone Encounter (Signed)
Call returned to Pt.  Per Pt she does not have dizziness during the day.  When she is laying in bed at night if she turns her head to the right it causes her to be dizzy.  She also has some dizziness when she first stands up from bed and she stands for a minute until it is better before she walks.  Discussed when getting up from bed to sit first then stand to avoid orthostatic hypotension which is a normal part of aging.    Discussed with Dr. Lovena Le the dizziness she has when lying in bed and turning her head.  Per Dr. Lovena Le could be related to inner ear issue.  Or could be stretching receptors in neck. Advised Pt follow up with her PCP.  Notified Pt.  Pt indicates understanding.  Thanked for call.

## 2018-01-30 ENCOUNTER — Encounter (HOSPITAL_COMMUNITY): Payer: Self-pay | Admitting: Emergency Medicine

## 2018-01-30 ENCOUNTER — Ambulatory Visit (HOSPITAL_COMMUNITY)
Admission: EM | Admit: 2018-01-30 | Discharge: 2018-01-30 | Disposition: A | Discharge status: Home / Self Care | Payer: Medicare Other | Attending: Family Medicine | Admitting: Family Medicine

## 2018-01-30 DIAGNOSIS — L509 Urticaria, unspecified: Secondary | ICD-10-CM | POA: Diagnosis not present

## 2018-01-30 MED ORDER — PREDNISONE 20 MG PO TABS: 20.0000 mg | ORAL_TABLET | Freq: Two times a day (BID) | ORAL | 0 refills | Status: DC

## 2018-01-30 NOTE — ED Triage Notes (Signed)
Here for rash all over body onset yest .... Reports she's been applying calamine lotion w/temp relief.   Denies new: foods, hygiene products  Reports she has had a PPM placed on 11/21/2017 and has been taking new meds for dizziness

## 2018-01-30 NOTE — ED Provider Notes (Signed)
[place]    CSN: [id number] Arrival date & time: 01/30/18  2353     History   Chief Complaint Chief Complaint  Patient presents with  . Rash    HPI Lauren Holloway is a 84 y.o. female.   HPI  Patient is here with a chief complaint of rash all of her body since yesterday.  It itches terribly.  She has been using calamine lotion and taking Allegra with no improvement.  She states that she called her cardiologist with the news that she was itching and they recommended Allegra for the itch. She states she is had this reaction before.  It happened right after she went home from her pacemaker insertion in September.  This went away spontaneously in 2 days. She denies any new medicines.  Denies any new supplements.  Denies any new foods.  Does not think she is any new soap lotion powder or product. Does not think she has underlying allergies of any consequence. She is not having any shortness of breath, chest pain or pressure, dizziness She impresses me that the itchiness is driving her crazy and keeping her awake at night.  Past Medical History:  Diagnosis Date  . Cataract   . Hyperlipidemia   . Hypertension     Patient Active Problem List   Diagnosis Date Noted  . AV heart block 11/20/2017  . Pain in right hip 10/17/2017  . Chronic right shoulder pain 03/17/2017  . Grade 2 ankle sprain, unspecified laterality, subsequent encounter 05/28/2016  . Dyspnea 10/25/2013  . Essential hypertension 05/18/2010  . ABDOMINAL PAIN RIGHT LOWER QUADRANT 05/18/2010  . PERSONAL HISTORY OF COLONIC POLYPS 05/18/2010    Past Surgical History:  Procedure Laterality Date  . ABDOMINAL HYSTERECTOMY    . APPENDECTOMY    . BILATERAL OOPHORECTOMY    . CATARACT EXTRACTION, BILATERAL Bilateral 1996  . PACEMAKER IMPLANT N/A 11/24/2017   Procedure: PACEMAKER IMPLANT;  Surgeon: Evans Lance, MD;  Location: [facility];  Service: Cardiovascular;  Laterality: N/A;    OB  History   None      Home Medications    Prior to Admission medications   Medication Sig Start Date End Date Taking? Authorizing Provider  ALPRAZolam Duanne Moron) 0.5 MG tablet Take 0.25 mg by mouth 2 (two) times daily as needed for anxiety or sleep. 06/17/16  Yes [provider]  amLODipine (NORVASC) 10 MG tablet Take 5 mg by mouth at bedtime.   Yes [provider]  escitalopram (LEXAPRO) 5 MG tablet Take 2.5 mg by mouth daily.    Yes [provider]  fexofenadine (ALLEGRA) 60 MG tablet Take 60 mg by mouth 2 (two) times daily.   Yes [provider]  Fish Oil-Cholecalciferol (FISH OIL + D3 PO) Take 1 tablet by mouth daily.    Yes [provider]  ibuprofen (ADVIL,MOTRIN) 400 MG tablet Take 400 mg by mouth every 6 (six) hours as needed for headache, mild pain or moderate pain.   Yes [provider]  lisinopril-hydrochlorothiazide (PRINZIDE,ZESTORETIC) 20-25 MG per tablet Take 1 tablet by mouth daily. 05/03/14  Yes [provider]  metoprolol tartrate (LOPRESSOR) 25 MG tablet Take 25 mg by mouth 2 (two) times daily.  03/18/13  Yes [provider]  Multiple Vitamins-Minerals (MULTIVITAMIN WITH MINERALS) tablet Take 1 tablet by mouth daily.   Yes [provider]  rosuvastatin (CRESTOR) 5 MG tablet Take 5 mg by mouth at bedtime.  06/07/15  Yes [provider]  VENTOLIN HFA 108 (90 BASE) MCG/ACT inhaler Inhale 1 puff into the lungs 2 (two) times daily as needed for wheezing.  04/19/14  Yes [provider]  predniSONE (DELTASONE) 20 MG tablet Take 1 tablet (20 mg total) by mouth 2 (two) times daily with a meal. After 5 days take one a day until gone 01/30/18   Raylene Everts, MD    Family History Family History  Problem Relation Age of Onset  . Ovarian cancer Mother   . Hypertension Mother   . Hypertension Father   . Heart disease Father   . Hypertension Sister   . Hypertension Sister     Social  History Social History   Tobacco Use  . Smoking status: Never Smoker  . Smokeless tobacco: Never Used  Substance Use Topics  . Alcohol use: Yes    Comment: MIX OCCASIONALLY  . Drug use: No     Allergies   Sulfonamide derivatives   Review of Systems Review of Systems  Constitutional: Negative for chills and fever.  HENT: Negative for ear pain and sore throat.   Eyes: Negative for pain and visual disturbance.  Respiratory: Negative for cough and shortness of breath.   Cardiovascular: Negative for chest pain and palpitations.  Gastrointestinal: Negative for abdominal pain and vomiting.  Genitourinary: Negative for dysuria and hematuria.  Musculoskeletal: Negative for arthralgias and back pain.  Skin: Positive for rash. Negative for color change.  Neurological: Negative for seizures and syncope.  Psychiatric/Behavioral: Positive for sleep disturbance.  All other systems reviewed and are negative.    Physical Exam Triage Vital Signs ED Triage Vitals  Enc Vitals Group     BP 01/30/18 1041 (!) 163/71     Pulse Rate 01/30/18 1041 60     Resp 01/30/18 1041 20     Temp 01/30/18 1041 97.7 F (36.5 C)     Temp Source 01/30/18 1041 Oral     SpO2 01/30/18 1041 99 %     Weight --      Height --      Head Circumference --      Peak Flow --      Pain Score 01/30/18 1042 9     Pain Loc --      Pain Edu? --      Excl. in [place]? --    No data found.  Updated Vital Signs BP (!) 163/71 (BP Location: Left Arm)   Pulse 60   Temp 97.7 F (36.5 C) (Oral)   Resp 20   SpO2 99%       Physical Exam  Constitutional: She appears well-developed and well-nourished. No distress.  HENT:  Head: Normocephalic and atraumatic.  Mouth/Throat: Oropharynx is clear and moist.  Eyes: Pupils are equal, round, and reactive to light. Conjunctivae are normal.  Neck: Normal range of motion.  Cardiovascular: Normal rate, regular rhythm and normal heart sounds.  Pacemaker site right upper chest is  healed well  Pulmonary/Chest: Effort normal and breath sounds normal. No respiratory distress.  Abdominal: Soft. She exhibits no distension.  Musculoskeletal: Normal range of motion. She exhibits no edema.  Neurological: She is alert.  Skin: Skin is warm and dry.  Psychiatric: She has a normal mood and affect. Her behavior is normal.  Urticarial wheeze are noted over the chest, abdomen, back concentrated around the waistline     UC Treatments / Results  Labs (all labs ordered are listed, but only abnormal results are displayed) Labs Reviewed -  No data to display  EKG None  Radiology No results found.  Procedures Procedures (including critical care time)  Medications Ordered in UC Medications - No data to display  Initial Impression / Assessment and Plan / UC Course  I have reviewed the triage vital signs and the nursing notes.  Pertinent labs & imaging results that were available during my care of the patient were reviewed by me and considered in my medical decision making (see chart for details).     Patient is allergic to sulfonamides, but she takes hydrochlorothiazide without difficulty since 2016.  I think it is unlikely to be due to  a medicine reaction.  I told her that it is easier to treat her urticaria that is to find out what caused it.  We discussed that she may need allergy testing ultimately to determine the cause. Has taken prednisone in the past with no side effects or problems.  She will continue the Allegra for itching. Final Clinical Impressions(s) / UC Diagnoses   Final diagnoses:  Urticaria     Discharge Instructions     Continue the allegra 2 x a day Take the prednisone as directed Cool compresses may help Lukewarm bath or shower - avoid heat Return if worse Call your PCP Monday.  May need allergy workup   ED Prescriptions    Medication Sig Dispense Auth. Provider   predniSONE (DELTASONE) 20 MG tablet Take 1 tablet (20 mg total) by mouth 2  (two) times daily with a meal. After 5 days take one a day until gone 15 tablet Meda Coffee Jennette Banker, MD     Controlled Substance Prescriptions [place] Controlled Substance Registry consulted? Not Applicable   Raylene Everts, MD 01/30/18 1140

## 2018-01-30 NOTE — Discharge Instructions (Signed)
Continue the allegra 2 x a day Take the prednisone as directed Cool compresses may help Lukewarm bath or shower - avoid heat Return if worse Call your PCP Monday.  May need allergy workup

## 2018-02-20 ENCOUNTER — Telehealth: Payer: Self-pay | Admitting: *Deleted

## 2018-02-20 NOTE — Telephone Encounter (Signed)
Per device move appt to PM if possible. Pt is agreeable and has moved appt time to 03/10/18 @ 3 pm .

## 2018-03-10 ENCOUNTER — Ambulatory Visit: Payer: Medicare Other | Admitting: Internal Medicine

## 2018-03-10 ENCOUNTER — Encounter: Payer: Self-pay | Admitting: Internal Medicine

## 2018-03-10 ENCOUNTER — Encounter: Payer: Medicare Other | Admitting: Internal Medicine

## 2018-03-10 VITALS — BP 124/62 | HR 60 | Ht 64.0 in | Wt 171.0 lb

## 2018-03-10 DIAGNOSIS — I441 Atrioventricular block, second degree: Secondary | ICD-10-CM

## 2018-03-10 DIAGNOSIS — Z950 Presence of cardiac pacemaker: Secondary | ICD-10-CM | POA: Diagnosis not present

## 2018-03-10 DIAGNOSIS — I10 Essential (primary) hypertension: Secondary | ICD-10-CM | POA: Diagnosis not present

## 2018-03-10 NOTE — Patient Instructions (Signed)
Medication Instructions:  Your physician recommends that you continue on your current medications as directed. Please refer to the Current Medication list given to you today.  Labwork: None ordered.  Testing/Procedures: None ordered.  Follow-Up: Your physician wants you to follow-up in: 9 months with Dr. Lovena Le.  You will receive a reminder letter in the mail two months in advance. If you don't receive a letter, please call our office to schedule the follow-up appointment.  Remote monitoring is used to monitor your Pacemaker from home. This monitoring reduces the number of office visits required to check your device to one time per year. It allows [place] to keep an eye on the functioning of your device to ensure it is working properly. You are scheduled for a device check from home on 06/09/2018. You may send your transmission at any time that day. If you have a wireless device, the transmission will be sent automatically. After your physician reviews your transmission, you will receive a postcard with your next transmission date.  Any Other Special Instructions Will Be Listed Below (If Applicable).  If you need a refill on your cardiac medications before your next appointment, please call your pharmacy.

## 2018-03-10 NOTE — Progress Notes (Signed)
HPI Lauren Holloway returns today for followup of her DDD PM. 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 In the interim, she has done well. She c/o mild peripheral edema. No chest pain or sob. No syncope.  Allergies  Allergen Reactions  . Sulfonamide Derivatives     HIVES   . Sulfasalazine     Other reaction(s): Other (See Comments) HIVES     Current Outpatient Medications  Medication Sig Dispense Refill  . ALPRAZolam (XANAX) 0.5 MG tablet Take 0.25 mg by mouth 2 (two) times daily as needed for anxiety or sleep.  3  . amLODipine (NORVASC) 10 MG tablet Take 5 mg by mouth at bedtime.    Marland Kitchen escitalopram (LEXAPRO) 5 MG tablet Take 2.5 mg by mouth daily.     . fexofenadine (ALLEGRA) 60 MG tablet Take 60 mg by mouth 2 (two) times daily.    . Fish Oil-Cholecalciferol (FISH OIL + D3 PO) Take 1 tablet by mouth daily.     Marland Kitchen ibuprofen (ADVIL,MOTRIN) 400 MG tablet Take 400 mg by mouth every 6 (six) hours as needed for headache, mild pain or moderate pain.    Marland Kitchen lisinopril-hydrochlorothiazide (PRINZIDE,ZESTORETIC) 20-25 MG per tablet Take 1 tablet by mouth daily.    . metoprolol tartrate (LOPRESSOR) 25 MG tablet Take 25 mg by mouth 2 (two) times daily.     . Multiple Vitamins-Minerals (MULTIVITAMIN WITH MINERALS) tablet Take 1 tablet by mouth daily.    . predniSONE (DELTASONE) 20 MG tablet Take 1 tablet (20 mg total) by mouth 2 (two) times daily with a meal. After 5 days take one a day until gone 15 tablet 0  . rosuvastatin (CRESTOR) 5 MG tablet Take 5 mg by mouth at bedtime.     . VENTOLIN HFA 108 (90 BASE) MCG/ACT inhaler Inhale 1 puff into the lungs 2 (two) times daily as needed for wheezing.      No current facility-administered medications for this visit.      Past Medical History:  Diagnosis Date  . Cataract   . Hyperlipidemia   . Hypertension     ROS:   All  systems reviewed and negative except as noted in the HPI.   Past Surgical History:  Procedure Laterality Date  . ABDOMINAL HYSTERECTOMY    . APPENDECTOMY    . BILATERAL OOPHORECTOMY    . CATARACT EXTRACTION, BILATERAL Bilateral 1996  . PACEMAKER IMPLANT N/A 11/24/2017   Procedure: PACEMAKER IMPLANT;  Surgeon: Evans Lance, MD;  Location: [facility];  Service: Cardiovascular;  Laterality: N/A;     Family History  Problem Relation Age of Onset  . Ovarian cancer Mother   . Hypertension Mother   . Hypertension Father   . Heart disease Father   . Hypertension Sister   . Hypertension Sister      Social History   Socioeconomic History  . Marital status: Widowed    Spouse name: Not on file  . Number of children: 1  . Years of education: Not on file  . Highest education level: Not on file  Occupational History  . Not on file  Social Needs  . Financial resource strain: Not on file  . Food insecurity:    Worry: Not on file    Inability: Not on file  . Transportation needs:    Medical: Not on file  Non-medical: Not on file  Tobacco Use  . Smoking status: Never Smoker  . Smokeless tobacco: Never Used  Substance and Sexual Activity  . Alcohol use: Yes    Comment: MIX OCCASIONALLY  . Drug use: No  . Sexual activity: Not on file  Lifestyle  . Physical activity:    Days per week: Not on file    Minutes per session: Not on file  . Stress: Not on file  Relationships  . Social connections:    Talks on phone: Not on file    Gets together: Not on file    Attends religious service: Not on file    Active member of club or organization: Not on file    Attends meetings of clubs or organizations: Not on file    Relationship status: Not on file  . Intimate partner violence:    Fear of current or ex partner: Not on file    Emotionally abused: Not on file    Physically abused: Not on file    Forced sexual activity: Not on file  Other Topics Concern  . Not on file    Social History Narrative  . Not on file     BP 124/62   Pulse 60   Ht 5\' 4"  (1.626 m)   Wt 171 lb (77.6 kg)   BMI 29.35 kg/m   Physical Exam:  Well appearing NAD HEENT: Unremarkable Neck:  No JVD, no thyromegally Lymphatics:  No adenopathy Back:  No CVA tenderness Lungs:  Clear with no wheezes HEART:  Regular rate rhythm, no murmurs, no rubs, no clicks Abd:  soft, positive bowel sounds, no organomegally, no rebound, no guarding Ext:  2 plus pulses, no edema, no cyanosis, no clubbing Skin:  No rashes no nodules Neuro:  CN II through XII intact, motor grossly intact  EKG - nsr with His bundle pacing  DEVICE  Normal device function.  See PaceArt for details.   Assess/Plan: 1. 2:1 AV block - she is asymptomatic, s/p PPM Insertion.  2. PPM - her medtronic DDD PM with a His bundle lead is working normally.  3. HTN - her blood pressure is well controlled. No change in her meds.  Mikle Bosworth.D.

## 2018-03-11 LAB — CUP PACEART INCLINIC DEVICE CHECK
Battery Remaining Longevity: 134 mo
Battery Voltage: 3.05 V
Brady Statistic AP VS Percent: 1.03 %
Brady Statistic AS VS Percent: 6.97 %
Implantable Lead Implant Date: 20190923
Implantable Lead Implant Date: 20190923
Implantable Lead Model: 5076
Implantable Pulse Generator Implant Date: 20190923
Lead Channel Impedance Value: 361 Ohm
Lead Channel Impedance Value: 456 Ohm
Lead Channel Pacing Threshold Amplitude: 0.625 V
Lead Channel Pacing Threshold Amplitude: 0.875 V
Lead Channel Pacing Threshold Pulse Width: 0.4 ms
Lead Channel Sensing Intrinsic Amplitude: 1.125 mV
Lead Channel Sensing Intrinsic Amplitude: 1.25 mV
Lead Channel Sensing Intrinsic Amplitude: 13.125 mV
Lead Channel Setting Sensing Sensitivity: 1.2 mV
MDC IDC LEAD LOCATION: 753859
MDC IDC LEAD LOCATION: 753860
MDC IDC MSMT LEADCHNL RA PACING THRESHOLD PULSEWIDTH: 0.4 ms
MDC IDC MSMT LEADCHNL RV IMPEDANCE VALUE: 304 Ohm
MDC IDC MSMT LEADCHNL RV IMPEDANCE VALUE: 399 Ohm
MDC IDC MSMT LEADCHNL RV SENSING INTR AMPL: 20.875 mV
MDC IDC SESS DTM: 20200107201038
MDC IDC SET LEADCHNL RA PACING AMPLITUDE: 1.5 V
MDC IDC SET LEADCHNL RV PACING AMPLITUDE: 2.5 V
MDC IDC SET LEADCHNL RV PACING PULSEWIDTH: 0.4 ms
MDC IDC STAT BRADY AP VP PERCENT: 34.95 %
MDC IDC STAT BRADY AS VP PERCENT: 57.04 %
MDC IDC STAT BRADY RA PERCENT PACED: 36.06 %
MDC IDC STAT BRADY RV PERCENT PACED: 91.99 %

## 2018-05-01 ENCOUNTER — Encounter (INDEPENDENT_AMBULATORY_CARE_PROVIDER_SITE_OTHER): Payer: Self-pay | Admitting: Physician Assistant

## 2018-05-01 ENCOUNTER — Ambulatory Visit (INDEPENDENT_AMBULATORY_CARE_PROVIDER_SITE_OTHER): Payer: Medicare Other | Admitting: Orthopaedic Surgery

## 2018-05-01 ENCOUNTER — Ambulatory Visit (INDEPENDENT_AMBULATORY_CARE_PROVIDER_SITE_OTHER): Payer: Medicare Other

## 2018-05-01 ENCOUNTER — Other Ambulatory Visit (INDEPENDENT_AMBULATORY_CARE_PROVIDER_SITE_OTHER): Payer: Self-pay

## 2018-05-01 DIAGNOSIS — M19012 Primary osteoarthritis, left shoulder: Secondary | ICD-10-CM | POA: Diagnosis not present

## 2018-05-01 DIAGNOSIS — G8929 Other chronic pain: Secondary | ICD-10-CM

## 2018-05-01 DIAGNOSIS — M25512 Pain in left shoulder: Secondary | ICD-10-CM

## 2018-05-01 MED ORDER — METHYLPREDNISOLONE ACETATE 40 MG/ML IJ SUSP
40.0000 mg | INTRAMUSCULAR | Status: AC | PRN
  Administered 2018-05-01: 40 mg via INTRA_ARTICULAR

## 2018-05-01 MED ORDER — LIDOCAINE HCL 2 % IJ SOLN
2.0000 mL | INTRAMUSCULAR | Status: AC | PRN
  Administered 2018-05-01: 2 mL

## 2018-05-01 MED ORDER — TRAMADOL HCL 50 MG PO TABS: 50.0000 mg | ORAL_TABLET | Freq: Four times a day (QID) | ORAL | 1 refills | Status: DC | PRN

## 2018-05-01 MED ORDER — BUPIVACAINE HCL 0.25 % IJ SOLN
2.0000 mL | INTRAMUSCULAR | Status: AC | PRN
  Administered 2018-05-01: 2 mL via INTRA_ARTICULAR

## 2018-05-01 NOTE — Progress Notes (Signed)
Office Visit Note   Patient: Lauren Holloway           Date of Birth: 14-Feb-1932           MRN: [id number] Visit Date: 05/01/2018              Requested by: [name], MD [address] [place], [place] [address] PCP: [name], MD   Assessment & Plan: Visit Diagnoses:  1. Chronic left shoulder pain     Plan: Impression is left shoulder subacromial bursitis and rotator cuff tendinitis versus less likely rotator cuff tear.  We will proceed with a left shoulder subacromial cortisone injection today.  She will give this at least 2 weeks to see if she notices relief.  If she does not notice any relief after 2 weeks, she will call us and we will get an MRI of her left shoulder to assess her rotator cuff.    Follow-Up Instructions: Return if symptoms worsen or fail to improve.   Orders:  Orders Placed This Encounter  Procedures  . Large Joint Inj: L subacromial bursa  . XR Shoulder Left   Meds ordered this encounter  Medications  . traMADol (ULTRAM) 50 MG tablet    Sig: Take 1 tablet (50 mg total) by mouth every 6 (six) hours as needed.    Dispense:  30 tablet    Refill:  1      Procedures: Large Joint Inj: L subacromial bursa on 05/01/2018 9:57 AM Indications: pain Details: 22 G needle Medications: 2 mL lidocaine 2 %; 2 mL bupivacaine 0.25 %; 40 mg methylPREDNISolone acetate 40 MG/ML Outcome: tolerated well, no immediate complications Patient was prepped and draped in the usual sterile fashion.       Clinical Data: No additional findings.   Subjective: Chief Complaint  Patient presents with  . Left Shoulder - Pain    HPI patient is a pleasant 85 year old female who presents our clinic today with left shoulder pain.  This began back in September 2019 after she had a syncopal episode.  This did result in pacemaker implantation.  She is unsure of how she fell, but does note that she has had significant left shoulder pain since a fall.  The pain she has is to the  entire aspect of the shoulder.  She describes this as a constant ache worse with forward flexion, internal rotation as well as sleeping on the left side.  She has been taking Tylenol with mild relief of symptoms.  She denies any focal weakness.  No numbness, tingling or burning.  No previous injury to either shoulder.  Review of Systems as detailed in HPI.  All others reviewed and are negative   Objective: Vital Signs: There were no vitals taken for this visit.  Physical Exam well-developed well-nourished female no acute distress.  Alert and oriented x3.  Ortho Exam examination of left shoulder reveals mild to moderate tenderness over the [facility] joint.  She has about 75% range of motion all planes.  She can internally rotate to her back pocket.  Minimally positive empty can.  Markedly positive cross body adduction.  Near full strength throughout.  Negative drop arm.  She is neurovascularly intact distally.  Specialty Comments:  No specialty comments available.  Imaging: Xr Shoulder Left  Result Date: 05/01/2018 No acute or structural abnormalities.  She does have moderate AC joint degenerative changes.    PMFS History: Patient Active Problem List   Diagnosis Date Noted  . AV heart  block 11/20/2017  . Pain in right hip 10/17/2017  . Chronic left shoulder pain 03/17/2017  . Grade 2 ankle sprain, unspecified laterality, subsequent encounter 05/28/2016  . Dyspnea 10/25/2013  . Essential hypertension 05/18/2010  . ABDOMINAL PAIN RIGHT LOWER QUADRANT 05/18/2010  . PERSONAL HISTORY OF COLONIC POLYPS 05/18/2010   Past Medical History:  Diagnosis Date  . Cataract   . Hyperlipidemia   . Hypertension     Family History  Problem Relation Age of Onset  . Ovarian cancer Mother   . Hypertension Mother   . Hypertension Father   . Heart disease Father   . Hypertension Sister   . Hypertension Sister     Past Surgical History:  Procedure Laterality Date  . ABDOMINAL HYSTERECTOMY    .  APPENDECTOMY    . BILATERAL OOPHORECTOMY    . CATARACT EXTRACTION, BILATERAL Bilateral 1996  . PACEMAKER IMPLANT N/A 11/24/2017   Procedure: PACEMAKER IMPLANT;  Surgeon: Evans Lance, MD;  Location: [facility] [place] [facility];  Service: Cardiovascular;  Laterality: N/A;   Social History   Occupational History  . Not on file  Tobacco Use  . Smoking status: Never Smoker  . Smokeless tobacco: Never Used  Substance and Sexual Activity  . Alcohol use: Yes    Comment: MIX OCCASIONALLY  . Drug use: No  . Sexual activity: Not on file

## 2018-06-09 ENCOUNTER — Other Ambulatory Visit: Payer: Self-pay

## 2018-06-09 ENCOUNTER — Ambulatory Visit (INDEPENDENT_AMBULATORY_CARE_PROVIDER_SITE_OTHER): Payer: Medicare Other | Admitting: *Deleted

## 2018-06-09 DIAGNOSIS — I441 Atrioventricular block, second degree: Secondary | ICD-10-CM

## 2018-06-09 DIAGNOSIS — I4430 Unspecified atrioventricular block: Secondary | ICD-10-CM

## 2018-06-09 LAB — CUP PACEART REMOTE DEVICE CHECK
Battery Remaining Longevity: 127 mo
Battery Voltage: 3.04 V
Brady Statistic AP VP Percent: 46.75 %
Brady Statistic AP VS Percent: 0.33 %
Brady Statistic AS VP Percent: 51.7 %
Brady Statistic AS VS Percent: 1.22 %
Brady Statistic RA Percent Paced: 47.19 %
Brady Statistic RV Percent Paced: 98.46 %
Date Time Interrogation Session: 20200407071044
Implantable Lead Implant Date: 20190923
Implantable Lead Implant Date: 20190923
Implantable Lead Location: 753859
Implantable Lead Location: 753860
Implantable Lead Model: 3830
Implantable Lead Model: 5076
Implantable Pulse Generator Implant Date: 20190923
Lead Channel Impedance Value: 304 Ohm
Lead Channel Impedance Value: 342 Ohm
Lead Channel Impedance Value: 380 Ohm
Lead Channel Impedance Value: 418 Ohm
Lead Channel Pacing Threshold Amplitude: 0.5 V
Lead Channel Pacing Threshold Amplitude: 1.75 V
Lead Channel Pacing Threshold Pulse Width: 0.4 ms
Lead Channel Pacing Threshold Pulse Width: 0.4 ms
Lead Channel Sensing Intrinsic Amplitude: 1.125 mV
Lead Channel Sensing Intrinsic Amplitude: 1.125 mV
Lead Channel Sensing Intrinsic Amplitude: 10.5 mV
Lead Channel Sensing Intrinsic Amplitude: 10.5 mV
Lead Channel Setting Pacing Amplitude: 1.5 V
Lead Channel Setting Pacing Amplitude: 2.5 V
Lead Channel Setting Pacing Pulse Width: 0.4 ms
Lead Channel Setting Sensing Sensitivity: 1.2 mV

## 2018-06-17 ENCOUNTER — Encounter: Payer: Self-pay | Admitting: Cardiology

## 2018-06-17 NOTE — Progress Notes (Signed)
Remote pacemaker transmission.   

## 2018-07-13 ENCOUNTER — Ambulatory Visit: Payer: Medicare Other | Attending: Otolaryngology | Admitting: Physical Therapy

## 2018-07-13 ENCOUNTER — Other Ambulatory Visit: Payer: Self-pay

## 2018-07-13 DIAGNOSIS — H8111 Benign paroxysmal vertigo, right ear: Secondary | ICD-10-CM | POA: Diagnosis not present

## 2018-07-13 NOTE — Patient Instructions (Signed)
Benign Positional Vertigo Vertigo is the feeling that you or your surroundings are moving when they are not. Benign positional vertigo is the most common form of vertigo. This is usually a harmless condition (benign). This condition is positional. This means that symptoms are triggered by certain movements and positions. This condition can be dangerous if it occurs while you are doing something that could cause harm to you or others. This includes activities such as driving or operating machinery. What are the causes? In many cases, the cause of this condition is not known. It may be caused by a disturbance in an area of the inner ear that helps your brain to sense movement and balance. This disturbance can be caused by:  Viral infection (labyrinthitis).  Head injury.  Repetitive motion, such as jumping, dancing, or running. What increases the risk? You are more likely to develop this condition if:  You are a woman.  You are 70 years of age or older. What are the signs or symptoms? Symptoms of this condition usually happen when you move your head or your eyes in different directions. Symptoms may start suddenly, and usually last for less than a minute. They include:  Loss of balance and falling.  Feeling like you are spinning or moving.  Feeling like your surroundings are spinning or moving.  Nausea and vomiting.  Blurred vision.  Dizziness.  Involuntary eye movement (nystagmus). Symptoms can be mild and cause only minor problems, or they can be severe and interfere with daily life. Episodes of benign positional vertigo may return (recur) over time. Symptoms may improve over time. How is this diagnosed? This condition may be diagnosed based on:  Your medical history.  Physical exam of the head, neck, and ears.  Tests, such as: ? MRI. ? CT scan. ? Eye movement tests. Your health care provider may ask you to change positions quickly while he or she watches you for symptoms  of benign positional vertigo, such as nystagmus. Eye movement may be tested with a variety of exams that are designed to evaluate or stimulate vertigo. ? An electroencephalogram (EEG). This records electrical activity in your brain. ? Hearing tests. You may be referred to a health care provider who specializes in ear, nose, and throat (ENT) problems (otolaryngologist) or a provider who specializes in disorders of the nervous system (neurologist). How is this treated?  This condition may be treated in a session in which your health care provider moves your head in specific positions to adjust your inner ear back to normal. Treatment for this condition may take several sessions. Surgery may be needed in severe cases, but this is rare. In some cases, benign positional vertigo may resolve on its own in 2-4 weeks. Follow these instructions at home: Safety  Move slowly. Avoid sudden body or head movements or certain positions, as told by your health care provider.  Avoid driving until your health care provider says it is safe for you to do so.  Avoid operating heavy machinery until your health care provider says it is safe for you to do so.  Avoid doing any tasks that would be dangerous to you or others if vertigo occurs.  If you have trouble walking or keeping your balance, try using a cane for stability. If you feel dizzy or unstable, sit down right away.  Return to your normal activities as told by your health care provider. Ask your health care provider what activities are safe for you. General instructions  Take over-the-counter  and prescription medicines only as told by your health care provider.  Drink enough fluid to keep your urine pale yellow.  Keep all follow-up visits as told by your health care provider. This is important. Contact a health care provider if:  You have a fever.  Your condition gets worse or you develop new symptoms.  Your family or friends notice any  behavioral changes.  You have nausea or vomiting that gets worse.  You have numbness or a "pins and needles" sensation. Get help right away if you:  Have difficulty speaking or moving.  Are always dizzy.  Faint.  Develop severe headaches.  Have weakness in your legs or arms.  Have changes in your hearing or vision.  Develop a stiff neck.  Develop sensitivity to light. Summary  Vertigo is the feeling that you or your surroundings are moving when they are not. Benign positional vertigo is the most common form of vertigo.  The cause of this condition is not known. It may be caused by a disturbance in an area of the inner ear that helps your brain to sense movement and balance.  Symptoms include loss of balance and falling, feeling that you or your surroundings are moving, nausea and vomiting, and blurred vision.  This condition can be diagnosed based on symptoms, physical exam, and other tests, such as MRI, CT scan, eye movement tests, and hearing tests.  Follow safety instructions as told by your health care provider. You will also be told when to contact your health care provider in case of problems. This information is not intended to replace advice given to you by your health care provider. Make sure you discuss any questions you have with your health care provider. Document Released: 11/26/2005 Document Revised: 07/30/2017 Document Reviewed: 07/30/2017 Elsevier Interactive Patient Education  2019 [place] to Side-Lying    Sit on edge of bed. 1. Turn head 45 to right. 2. Maintain head position and lie down slowly on left side. Hold until symptoms subside. 3. Sit up slowly. Hold until symptoms subside. 4. Turn head 45 to left. 5. Maintain head position and lie down slowly on right side. Hold until symptoms subside. 6. Sit up slowly. Repeat sequence __5__ times per session. Do _3-5___ sessions per day.    Self Treatment for Right Posterior / Anterior  Canalithiasis    Sitting on bed: 1. Turn head 45 right. (a) Lie back slowly, shoulders on pillow, head on bed. (b) Hold __30__ seconds. 2. Keeping head on bed, turn head 90 left. Hold _30___ seconds. 3. Roll to left, head on 45 angle down toward bed. Hold _30___ seconds. 4. Sit up on left side of bed. Repeat _3___ times per session. Do _1-2___ sessions per day.  Copyright  VHI. All rights reserved.   How to Perform the Epley Maneuver The Epley maneuver is an exercise that relieves symptoms of vertigo. Vertigo is the feeling that you or your surroundings are moving when they are not. When you feel vertigo, you may feel like the room is spinning and have trouble walking. Dizziness is a little different than vertigo. When you are dizzy, you may feel unsteady or light-headed. You can do this maneuver at home whenever you have symptoms of vertigo. You can do it up to 3 times a day until your symptoms go away. Even though the Epley maneuver may relieve your vertigo for a few weeks, it is possible that your symptoms will return. This maneuver relieves  vertigo, but it does not relieve dizziness. What are the risks? If it is done correctly, the Epley maneuver is considered safe. Sometimes it can lead to dizziness or nausea that goes away after a short time. If you develop other symptoms, such as changes in vision, weakness, or numbness, stop doing the maneuver and call your health care provider. How to perform the Epley maneuver 1. Sit on the edge of a bed or table with your back straight and your legs extended or hanging over the edge of the bed or table. 2. Turn your head halfway toward the affected ear or side. 3. Lie backward quickly with your head turned until you are lying flat on your back. You may want to position a pillow under your shoulders. 4. Hold this position for 30 seconds. You may experience an attack of vertigo. This is normal. 5. Turn your head to the opposite direction until your  unaffected ear is facing the floor. 6. Hold this position for 30 seconds. You may experience an attack of vertigo. This is normal. Hold this position until the vertigo stops. 7. Turn your whole body to the same side as your head. Hold for another 30 seconds. 8. Sit back up. You can repeat this exercise up to 3 times a day. Follow these instructions at home:  After doing the Epley maneuver, you can return to your normal activities.  Ask your health care provider if there is anything you should do at home to prevent vertigo. He or she may recommend that you: ? Keep your head raised (elevated) with two or more pillows while you sleep. ? Do not sleep on the side of your affected ear. ? Get up slowly from bed. ? Avoid sudden movements during the day. ? Avoid extreme head movement, like looking up or bending over. Contact a health care provider if:  Your vertigo gets worse.  You have other symptoms, including: ? Nausea. ? Vomiting. ? Headache. Get help right away if:  You have vision changes.  You have a severe or worsening headache or neck pain.  You cannot stop vomiting.  You have new numbness or weakness in any part of your body. Summary  Vertigo is the feeling that you or your surroundings are moving when they are not.  The Epley maneuver is an exercise that relieves symptoms of vertigo.  If the Epley maneuver is done correctly, it is considered safe. You can do it up to 3 times a day. This information is not intended to replace advice given to you by your health care provider. Make sure you discuss any questions you have with your health care provider. Document Released: 02/23/2013 Document Revised: 01/09/2016 Document Reviewed: 01/09/2016 Elsevier Interactive Patient Education  2019 [employer].

## 2018-07-14 NOTE — Therapy (Signed)
[facility] [address] [facility], [place], [address] Phone: [phone]   Fax:  [phone]  Physical Therapy Evaluation  Patient Details  Name: Lauren Holloway MRN: [id number] Date of Birth: 10/03/1931 Referring Provider (PT): Melissa Montane, MD    CLINIC OPERATION CHANGES: [facility] is operating at a low capacity due to COVID-19.  The patient was brought into the clinic for evaluation and/or treatment  following universal masking by staff, social distancing, and <10 people in the clinic.  The patient's COVID risk of complications score is 4.  Encounter Date: 07/13/2018  PT End of Session - 07/14/18 1453    Visit Number  1    Number of Visits  3    Date for PT Re-Evaluation  08/13/18    Authorization Type  [employer] Medicare    Authorization Time Period  07-13-18 - 10-10-18    PT Start Time  0845    PT Stop Time  0930    PT Time Calculation (min)  45 min    Activity Tolerance  Patient tolerated treatment well    Behavior During Therapy  [facility] for tasks assessed/performed       Past Medical History:  Diagnosis Date  . Cataract   . Hyperlipidemia   . Hypertension     Past Surgical History:  Procedure Laterality Date  . ABDOMINAL HYSTERECTOMY    . APPENDECTOMY    . BILATERAL OOPHORECTOMY    . CATARACT EXTRACTION, BILATERAL Bilateral 1996  . PACEMAKER IMPLANT N/A 11/24/2017   Procedure: PACEMAKER IMPLANT;  Surgeon: Evans Lance, MD;  Location: [facility];  Service: Cardiovascular;  Laterality: N/A;    There were no vitals filed for this visit.   Subjective Assessment - 07/14/18 1442    Subjective  Pt states she passed out in beauty shop in Sept. 2019; has had dizziness and some nausea but no vomiting since this onset of vertigo episode;    Pertinent History  pacemaker implant 11-24-17;  HTN    Patient Stated Goals  Resolve the vertigo         [facility] PT Assessment - 07/14/18 0001       Assessment   Medical Diagnosis  BPPV    Referring Provider (PT)  Melissa Montane, MD    Onset Date/Surgical Date  --   Sept. 2019     Precautions   Precautions  Other (comment)   Vertigo - positional     Balance Screen   Has the patient fallen in the past 6 months  No    Has the patient had a decrease in activity level because of a fear of falling?   No    Is the patient reluctant to leave their home because of a fear of falling?   No      Prior Function   Level of Independence  Independent           Vestibular Assessment - 07/14/18 0001      Vestibular Assessment   General Observation  Pt is an 85 yr old lady amb. independently - reports she has had vertigo since 11/2017 at which time she had syncopal episode in beauty shop; states she gets dizzy when she rolls onto her right side      Symptom Behavior   Subjective history of current problem  pt reports vertigo started in Sept. 2019 after she had syncopal episode in beauty shop; pt had pacemaker implant on 11-25-27  Type of Dizziness   Spinning    Frequency of Dizziness  depends on the motion performed    Duration of Dizziness  secs    Symptom Nature  Positional    Aggravating Factors  Rolling to right    Relieving Factors  Lying supine;Head stationary    Progression of Symptoms  No change since onset      Oculomotor Exam   Oculomotor Alignment  Normal    Spontaneous  Absent      Positional Testing   Dix-Hallpike  Dix-Hallpike Right;Dix-Hallpike Left      Dix-Hallpike Right   Dix-Hallpike Right Duration  approx. 15 secs    Dix-Hallpike Right Symptoms  Upbeat, right rotatory nystagmus      Dix-Hallpike Left   Dix-Hallpike Left Duration  none    Dix-Hallpike Left Symptoms  No nystagmus          Objective measurements completed on examination: See above findings.      Pt was treated with 3 reps of Epley maneuver for Rt BPPV with improvement of symptoms on 2nd rep - with no symptoms reported on 3rd rep  of maneuver - indicative of resolution of Rt BPPV         PT Education - 07/14/18 1451    Education Details  pt given info on etiology of BPPV and Laruth Bouchard- Daroff exercises prn; also given instructions for Epley if she decides to try it for self treatment at home if needed - should BPPV re-occur    Person(s) Educated  Patient    Methods  Explanation;Demonstration;Handout    Comprehension  Verbalized understanding;Returned demonstration          PT Long Term Goals - 07/14/18 1501      PT LONG TERM GOAL #1   Title  Pt will have a (-) Rt Dix-Hallpike test to indicate resolution of Rt BPPV.      Time  3    Period  Weeks    Status  New    Target Date  08/13/18      PT LONG TERM GOAL #2   Title  Pt will report no vertigo with bed mobility, including ability to roll onto right side for sleeping.    Time  3    Period  Weeks    Status  New    Target Date  08/13/18      PT LONG TERM GOAL #3   Title  Independent in [place] exercises prn should pt have re-occurrence of BPPV.      Time  3    Period  Weeks    Status  New    Target Date  08/13/18             Plan - 07/14/18 1455    Clinical Impression Statement  Pt presents with (+) Rt Dix-Hallpike test with rotary upbeating nystagmus noted and c/o vertigo in test position, indicative of Rt posterior canalithiasis.  Pt was treated with 3 reps of Epley for Rt BPPV posterior canal; symptoms appear to be resolved on 3rd rep with no nystagmus and no c/o vertigo reported during manuever.  Personal Factors and Comorbidities  Comorbidity 1;Age    Examination-Activity Limitations  Locomotion Level;Reach Overhead    Examination-Participation Restrictions  Cleaning;Other;Community Activity    Stability/Clinical Decision Making  Stable/Uncomplicated    Clinical Decision Making   Low    Rehab Potential  Good    PT Frequency  1x / week    PT Duration  3 weeks    PT Treatment/Interventions  ADLs/Self Care Home Management;Therapeutic activities;Therapeutic exercise;Balance training;Canalith Repostioning;Neuromuscular re-education;Patient/family education;Vestibular    PT Next Visit Plan  recheck Rt BPPV - Epley as needed    PT Home Exercise Plan  Brandt-Daroff exercises prn     Consulted and Agree with Plan of Care  Patient       Patient will benefit from skilled therapeutic intervention in order to improve the following deficits and impairments:  Difficulty walking, Decreased balance, Dizziness  Visit Diagnosis: BPPV (benign paroxysmal positional vertigo), right - Plan: PT plan of care cert/re-cert     Problem List Patient Active Problem List   Diagnosis Date Noted  . AV heart block 11/20/2017  . Pain in right hip 10/17/2017  . Chronic left shoulder pain 03/17/2017  . Grade 2 ankle sprain, unspecified laterality, subsequent encounter 05/28/2016  . Dyspnea 10/25/2013  . Essential hypertension 05/18/2010  . ABDOMINAL PAIN RIGHT LOWER QUADRANT 05/18/2010  . PERSONAL HISTORY OF COLONIC POLYPS 05/18/2010    DildayJenness Corner, PT 07/14/2018, 3:07 PM  [facility] [address] [place], [place], [address] Phone: [phone]   Fax:  [phone]  Name: DERICKA OSTENSON MRN: [id number] Date of Birth: 09-Nov-1931

## 2018-07-20 ENCOUNTER — Other Ambulatory Visit: Payer: Self-pay

## 2018-07-20 ENCOUNTER — Ambulatory Visit (INDEPENDENT_AMBULATORY_CARE_PROVIDER_SITE_OTHER): Payer: Medicare Other | Admitting: *Deleted

## 2018-07-20 DIAGNOSIS — I4430 Unspecified atrioventricular block: Secondary | ICD-10-CM

## 2018-07-20 LAB — CUP PACEART INCLINIC DEVICE CHECK
Battery Remaining Longevity: 81 mo
Battery Voltage: 3.03 V
Brady Statistic AP VP Percent: 45.87 %
Brady Statistic AP VS Percent: 0.36 %
Brady Statistic AS VP Percent: 52.28 %
Brady Statistic AS VS Percent: 1.49 %
Brady Statistic RA Percent Paced: 46.34 %
Brady Statistic RV Percent Paced: 98.16 %
Date Time Interrogation Session: 20200518131018
Implantable Lead Implant Date: 20190923
Implantable Lead Implant Date: 20190923
Implantable Lead Location: 753859
Implantable Lead Location: 753860
Implantable Lead Model: 3830
Implantable Lead Model: 5076
Implantable Pulse Generator Implant Date: 20190923
Lead Channel Impedance Value: 285 Ohm
Lead Channel Impedance Value: 342 Ohm
Lead Channel Impedance Value: 380 Ohm
Lead Channel Impedance Value: 437 Ohm
Lead Channel Pacing Threshold Amplitude: 0.5 V
Lead Channel Pacing Threshold Amplitude: 2.5 V
Lead Channel Pacing Threshold Pulse Width: 0.4 ms
Lead Channel Pacing Threshold Pulse Width: 0.4 ms
Lead Channel Sensing Intrinsic Amplitude: 1.125 mV
Lead Channel Sensing Intrinsic Amplitude: 1.5 mV
Lead Channel Sensing Intrinsic Amplitude: 10.875 mV
Lead Channel Sensing Intrinsic Amplitude: 11.875 mV
Lead Channel Setting Pacing Amplitude: 1.5 V
Lead Channel Setting Pacing Amplitude: 3 V
Lead Channel Setting Pacing Pulse Width: 1 ms
Lead Channel Setting Sensing Sensitivity: 1.2 mV

## 2018-07-20 NOTE — Progress Notes (Signed)
Pacemaker check in clinic. Normal device function. Tested RV (HIS) threshold unipolar and bipolar due to elevated RV threshold noted on recent remote. Bipolar threshold 1.5V @ 1.45ms, Unipolar threshold 1.25V @ 1.73ms, changed RV output to 3V at 1.61ms. Sensing, impedances consistent with previous measurements. Device programmed to maximize longevity. No mode switch or high ventricular rates noted. Device programmed at appropriate safety margins. Histogram distribution appropriate for patient activity level. Device programmed to optimize intrinsic conduction. Estimated longevity 10.4 years. Patient enrolled in remote follow-up 09/08/18. Patient education completed. ROV with GT in 8 months.

## 2018-07-28 ENCOUNTER — Ambulatory Visit: Payer: Medicare Other | Admitting: Physical Therapy

## 2018-08-05 ENCOUNTER — Ambulatory Visit (INDEPENDENT_AMBULATORY_CARE_PROVIDER_SITE_OTHER): Payer: Medicare Other

## 2018-08-05 ENCOUNTER — Encounter: Payer: Self-pay | Admitting: Orthopaedic Surgery

## 2018-08-05 ENCOUNTER — Ambulatory Visit (INDEPENDENT_AMBULATORY_CARE_PROVIDER_SITE_OTHER): Payer: Medicare Other | Admitting: Orthopaedic Surgery

## 2018-08-05 ENCOUNTER — Other Ambulatory Visit: Payer: Self-pay

## 2018-08-05 DIAGNOSIS — M25551 Pain in right hip: Secondary | ICD-10-CM

## 2018-08-05 MED ORDER — BUPIVACAINE HCL 0.5 % IJ SOLN
3.0000 mL | INTRAMUSCULAR | Status: AC | PRN
  Administered 2018-08-05: 3 mL via INTRA_ARTICULAR

## 2018-08-05 MED ORDER — LIDOCAINE HCL 1 % IJ SOLN
3.0000 mL | INTRAMUSCULAR | Status: AC | PRN
  Administered 2018-08-05: 3 mL

## 2018-08-05 MED ORDER — METHYLPREDNISOLONE ACETATE 40 MG/ML IJ SUSP
40.0000 mg | INTRAMUSCULAR | Status: AC | PRN
  Administered 2018-08-05: 40 mg via INTRA_ARTICULAR

## 2018-08-05 NOTE — Progress Notes (Signed)
Office Visit Note   Patient: Lauren Holloway           Date of Birth: 1931-10-29           MRN: [id number] Visit Date: 08/05/2018              Requested by: [name], MD [address] [place], [place] [address] PCP: [name], MD   Assessment & Plan: Visit Diagnoses:  1. Pain in right hip     Plan: Impression is lateral right hip pain suspect IT band syndrome less likely bursitis.  Today we performed a cortisone injection to help with pain as well as made a referral to outpatient physical therapy.  She has a history of a pacemaker therefore she should not take NSAIDs.  Questions encouraged and answered.  Follow-up as needed.  Follow-Up Instructions: Return if symptoms worsen or fail to improve.   Orders:  Orders Placed This Encounter  Procedures  . XR HIP UNILAT W OR W/O PELVIS 2-3 VIEWS RIGHT   No orders of the defined types were placed in this encounter.     Procedures: Large Joint Inj: R greater trochanter on 08/05/2018 2:30 PM Indications: pain Details: 22 G needle  Arthrogram: No  Medications: 3 mL lidocaine 1 %; 3 mL bupivacaine 0.5 %; 40 mg methylPREDNISolone acetate 40 MG/ML Patient was prepped and draped in the usual sterile fashion.       Clinical Data: No additional findings.   Subjective: Chief Complaint  Patient presents with  . Right Hip - Pain    Lauren Holloway is is 85 year old female comes in for evaluation of her right hip pain.  Her pain is pretty much localized to the lateral aspect.  She has occasional groin pain.  Denies any back pain or radicular symptoms.  The lateral hip pain does radiate down into the lateral aspect of the knee.  It is not causing her any significant pain with palpation or touch.  Denies any injuries.   Review of Systems  Constitutional: Negative.   HENT: Negative.   Eyes: Negative.   Respiratory: Negative.   Cardiovascular: Negative.   Endocrine: Negative.   Musculoskeletal: Negative.   Neurological: Negative.    Hematological: Negative.   Psychiatric/Behavioral: Negative.   All other systems reviewed and are negative.    Objective: Vital Signs: There were no vitals taken for this visit.  Physical Exam Vitals signs and nursing note reviewed.  Constitutional:      Appearance: She is well-developed.  Pulmonary:     Effort: Pulmonary effort is normal.  Skin:    General: Skin is warm.     Capillary Refill: Capillary refill takes less than 2 seconds.  Neurological:     Mental Status: She is alert and oriented to person, place, and time.  Psychiatric:        Behavior: Behavior normal.        Thought Content: Thought content normal.        Judgment: Judgment normal.     Ortho Exam Right hip exam shows good range of motion without pain.  Negative Stinchfield sign.  The greater trochanter and IT band were tender to palpation more so on the anterior half.  There is no soft tissue crepitus. Specialty Comments:  No specialty comments available.  Imaging: Xr Hip Unilat W Or W/o Pelvis 2-3 Views Right  Result Date: 08/05/2018 No acute or structural abnormalities.  Age-appropriate.    PMFS History: Patient Active Problem List   Diagnosis Date  Noted  . AV heart block 11/20/2017  . Pain in right hip 10/17/2017  . Chronic left shoulder pain 03/17/2017  . Grade 2 ankle sprain, unspecified laterality, subsequent encounter 05/28/2016  . Dyspnea 10/25/2013  . Essential hypertension 05/18/2010  . ABDOMINAL PAIN RIGHT LOWER QUADRANT 05/18/2010  . PERSONAL HISTORY OF COLONIC POLYPS 05/18/2010   Past Medical History:  Diagnosis Date  . Cataract   . Hyperlipidemia   . Hypertension     Family History  Problem Relation Age of Onset  . Ovarian cancer Mother   . Hypertension Mother   . Hypertension Father   . Heart disease Father   . Hypertension Sister   . Hypertension Sister     Past Surgical History:  Procedure Laterality Date  . ABDOMINAL HYSTERECTOMY    . APPENDECTOMY    .  BILATERAL OOPHORECTOMY    . CATARACT EXTRACTION, BILATERAL Bilateral 1996  . PACEMAKER IMPLANT N/A 11/24/2017   Procedure: PACEMAKER IMPLANT;  Surgeon: Evans Lance, MD;  Location: [facility];  Service: Cardiovascular;  Laterality: N/A;   Social History   Occupational History  . Not on file  Tobacco Use  . Smoking status: Never Smoker  . Smokeless tobacco: Never Used  Substance and Sexual Activity  . Alcohol use: Yes    Comment: MIX OCCASIONALLY  . Drug use: No  . Sexual activity: Not on file

## 2018-09-08 ENCOUNTER — Ambulatory Visit (INDEPENDENT_AMBULATORY_CARE_PROVIDER_SITE_OTHER): Payer: Medicare Other | Admitting: *Deleted

## 2018-09-08 DIAGNOSIS — I4430 Unspecified atrioventricular block: Secondary | ICD-10-CM

## 2018-09-08 LAB — CUP PACEART REMOTE DEVICE CHECK
Battery Remaining Longevity: 79 mo
Battery Voltage: 3.01 V
Brady Statistic AP VP Percent: 50.26 %
Brady Statistic AP VS Percent: 0.46 %
Brady Statistic AS VP Percent: 43.73 %
Brady Statistic AS VS Percent: 5.55 %
Brady Statistic RA Percent Paced: 50.77 %
Brady Statistic RV Percent Paced: 93.99 %
Date Time Interrogation Session: 20200707010319
Implantable Lead Implant Date: 20190923
Implantable Lead Implant Date: 20190923
Implantable Lead Location: 753859
Implantable Lead Location: 753860
Implantable Lead Model: 3830
Implantable Lead Model: 5076
Implantable Pulse Generator Implant Date: 20190923
Lead Channel Impedance Value: 285 Ohm
Lead Channel Impedance Value: 323 Ohm
Lead Channel Impedance Value: 380 Ohm
Lead Channel Impedance Value: 418 Ohm
Lead Channel Pacing Threshold Amplitude: 0.5 V
Lead Channel Pacing Threshold Amplitude: 1.625 V
Lead Channel Pacing Threshold Pulse Width: 0.4 ms
Lead Channel Pacing Threshold Pulse Width: 0.4 ms
Lead Channel Sensing Intrinsic Amplitude: 1 mV
Lead Channel Sensing Intrinsic Amplitude: 1 mV
Lead Channel Sensing Intrinsic Amplitude: 9.5 mV
Lead Channel Sensing Intrinsic Amplitude: 9.5 mV
Lead Channel Setting Pacing Amplitude: 1.5 V
Lead Channel Setting Pacing Amplitude: 3 V
Lead Channel Setting Pacing Pulse Width: 1 ms
Lead Channel Setting Sensing Sensitivity: 1.2 mV

## 2018-09-19 ENCOUNTER — Encounter: Payer: Self-pay | Admitting: Cardiology

## 2018-09-19 NOTE — Progress Notes (Signed)
Remote pacemaker transmission.   

## 2018-12-08 ENCOUNTER — Ambulatory Visit (INDEPENDENT_AMBULATORY_CARE_PROVIDER_SITE_OTHER): Payer: Medicare Other | Admitting: *Deleted

## 2018-12-08 DIAGNOSIS — I4430 Unspecified atrioventricular block: Secondary | ICD-10-CM

## 2018-12-09 LAB — CUP PACEART REMOTE DEVICE CHECK
Battery Remaining Longevity: 82 mo
Battery Voltage: 3 V
Brady Statistic AP VP Percent: 47.44 %
Brady Statistic AP VS Percent: 2.26 %
Brady Statistic AS VP Percent: 22.75 %
Brady Statistic AS VS Percent: 27.55 %
Brady Statistic RA Percent Paced: 49.83 %
Brady Statistic RV Percent Paced: 70.19 %
Date Time Interrogation Session: 20201006041351
Implantable Lead Implant Date: 20190923
Implantable Lead Implant Date: 20190923
Implantable Lead Location: 753859
Implantable Lead Location: 753860
Implantable Lead Model: 3830
Implantable Lead Model: 5076
Implantable Pulse Generator Implant Date: 20190923
Lead Channel Impedance Value: 323 Ohm
Lead Channel Impedance Value: 380 Ohm
Lead Channel Impedance Value: 418 Ohm
Lead Channel Impedance Value: 456 Ohm
Lead Channel Pacing Threshold Amplitude: 0.625 V
Lead Channel Pacing Threshold Amplitude: 2 V
Lead Channel Pacing Threshold Pulse Width: 0.4 ms
Lead Channel Pacing Threshold Pulse Width: 0.4 ms
Lead Channel Sensing Intrinsic Amplitude: 1 mV
Lead Channel Sensing Intrinsic Amplitude: 1 mV
Lead Channel Sensing Intrinsic Amplitude: 13.25 mV
Lead Channel Sensing Intrinsic Amplitude: 13.25 mV
Lead Channel Setting Pacing Amplitude: 1.5 V
Lead Channel Setting Pacing Amplitude: 3 V
Lead Channel Setting Pacing Pulse Width: 1 ms
Lead Channel Setting Sensing Sensitivity: 1.2 mV

## 2018-12-17 NOTE — Progress Notes (Signed)
Remote pacemaker transmission.   

## 2019-03-09 ENCOUNTER — Ambulatory Visit (INDEPENDENT_AMBULATORY_CARE_PROVIDER_SITE_OTHER): Payer: Medicare PPO | Admitting: *Deleted

## 2019-03-09 DIAGNOSIS — I4430 Unspecified atrioventricular block: Secondary | ICD-10-CM

## 2019-03-09 LAB — CUP PACEART REMOTE DEVICE CHECK
Battery Remaining Longevity: 80 mo
Battery Voltage: 3.01 V
Brady Statistic AP VP Percent: 51.51 %
Brady Statistic AP VS Percent: 1.81 %
Brady Statistic AS VP Percent: 15.18 %
Brady Statistic AS VS Percent: 31.51 %
Brady Statistic RA Percent Paced: 53.43 %
Brady Statistic RV Percent Paced: 66.68 %
Date Time Interrogation Session: 20210104230610
Implantable Lead Implant Date: 20190923
Implantable Lead Implant Date: 20190923
Implantable Lead Location: 753859
Implantable Lead Location: 753860
Implantable Lead Model: 3830
Implantable Lead Model: 5076
Implantable Pulse Generator Implant Date: 20190923
Lead Channel Impedance Value: 323 Ohm
Lead Channel Impedance Value: 361 Ohm
Lead Channel Impedance Value: 399 Ohm
Lead Channel Impedance Value: 418 Ohm
Lead Channel Pacing Threshold Amplitude: 0.5 V
Lead Channel Pacing Threshold Amplitude: 2.25 V
Lead Channel Pacing Threshold Pulse Width: 0.4 ms
Lead Channel Pacing Threshold Pulse Width: 0.4 ms
Lead Channel Sensing Intrinsic Amplitude: 1.375 mV
Lead Channel Sensing Intrinsic Amplitude: 1.375 mV
Lead Channel Sensing Intrinsic Amplitude: 9.875 mV
Lead Channel Sensing Intrinsic Amplitude: 9.875 mV
Lead Channel Setting Pacing Amplitude: 1.5 V
Lead Channel Setting Pacing Amplitude: 3 V
Lead Channel Setting Pacing Pulse Width: 1 ms
Lead Channel Setting Sensing Sensitivity: 1.2 mV

## 2019-06-08 ENCOUNTER — Ambulatory Visit (INDEPENDENT_AMBULATORY_CARE_PROVIDER_SITE_OTHER): Payer: Medicare PPO | Admitting: *Deleted

## 2019-06-08 DIAGNOSIS — I4430 Unspecified atrioventricular block: Secondary | ICD-10-CM

## 2019-06-08 LAB — CUP PACEART REMOTE DEVICE CHECK
Battery Remaining Longevity: 78 mo
Battery Voltage: 3 V
Brady Statistic AP VP Percent: 55.47 %
Brady Statistic AP VS Percent: 1.83 %
Brady Statistic AS VP Percent: 13.24 %
Brady Statistic AS VS Percent: 29.45 %
Brady Statistic RA Percent Paced: 57.4 %
Brady Statistic RV Percent Paced: 68.71 %
Date Time Interrogation Session: 20210405234939
Implantable Lead Implant Date: 20190923
Implantable Lead Implant Date: 20190923
Implantable Lead Location: 753859
Implantable Lead Location: 753860
Implantable Lead Model: 3830
Implantable Lead Model: 5076
Implantable Pulse Generator Implant Date: 20190923
Lead Channel Impedance Value: 323 Ohm
Lead Channel Impedance Value: 361 Ohm
Lead Channel Impedance Value: 399 Ohm
Lead Channel Impedance Value: 418 Ohm
Lead Channel Pacing Threshold Amplitude: 0.5 V
Lead Channel Pacing Threshold Amplitude: 2.25 V
Lead Channel Pacing Threshold Pulse Width: 0.4 ms
Lead Channel Pacing Threshold Pulse Width: 0.4 ms
Lead Channel Sensing Intrinsic Amplitude: 0.875 mV
Lead Channel Sensing Intrinsic Amplitude: 0.875 mV
Lead Channel Sensing Intrinsic Amplitude: 8.625 mV
Lead Channel Sensing Intrinsic Amplitude: 8.625 mV
Lead Channel Setting Pacing Amplitude: 1.5 V
Lead Channel Setting Pacing Amplitude: 3 V
Lead Channel Setting Pacing Pulse Width: 1 ms
Lead Channel Setting Sensing Sensitivity: 1.2 mV

## 2019-06-09 NOTE — Progress Notes (Signed)
PPM Remote  

## 2019-07-20 DIAGNOSIS — R7301 Impaired fasting glucose: Secondary | ICD-10-CM | POA: Diagnosis not present

## 2019-07-20 DIAGNOSIS — E876 Hypokalemia: Secondary | ICD-10-CM | POA: Diagnosis not present

## 2019-07-20 DIAGNOSIS — I10 Essential (primary) hypertension: Secondary | ICD-10-CM | POA: Diagnosis not present

## 2019-07-20 DIAGNOSIS — K649 Unspecified hemorrhoids: Secondary | ICD-10-CM | POA: Diagnosis not present

## 2019-07-20 DIAGNOSIS — Z950 Presence of cardiac pacemaker: Secondary | ICD-10-CM | POA: Diagnosis not present

## 2019-07-20 DIAGNOSIS — Z1331 Encounter for screening for depression: Secondary | ICD-10-CM | POA: Diagnosis not present

## 2019-07-20 DIAGNOSIS — F4322 Adjustment disorder with anxiety: Secondary | ICD-10-CM | POA: Diagnosis not present

## 2019-08-12 ENCOUNTER — Telehealth: Payer: Self-pay | Admitting: Internal Medicine

## 2019-08-12 NOTE — Telephone Encounter (Signed)
Received a call from [employer] that Lauren Holloway has a high RV pacing threshold on remote interrogation. Per [employer], the RV lead is capturing appropriately and the high threshold has been fairly stable over the past few years. Will update Dr. Lovena Le.  Alric Quan, MD Cardiology [place]

## 2019-08-13 DIAGNOSIS — Z833 Family history of diabetes mellitus: Secondary | ICD-10-CM | POA: Diagnosis not present

## 2019-08-13 DIAGNOSIS — H2000 Unspecified acute and subacute iridocyclitis: Secondary | ICD-10-CM | POA: Diagnosis not present

## 2019-08-13 DIAGNOSIS — M1990 Unspecified osteoarthritis, unspecified site: Secondary | ICD-10-CM | POA: Diagnosis not present

## 2019-08-13 DIAGNOSIS — Z809 Family history of malignant neoplasm, unspecified: Secondary | ICD-10-CM | POA: Diagnosis not present

## 2019-08-13 DIAGNOSIS — Z823 Family history of stroke: Secondary | ICD-10-CM | POA: Diagnosis not present

## 2019-08-13 DIAGNOSIS — I10 Essential (primary) hypertension: Secondary | ICD-10-CM | POA: Diagnosis not present

## 2019-08-13 DIAGNOSIS — Z8249 Family history of ischemic heart disease and other diseases of the circulatory system: Secondary | ICD-10-CM | POA: Diagnosis not present

## 2019-08-13 DIAGNOSIS — F419 Anxiety disorder, unspecified: Secondary | ICD-10-CM | POA: Diagnosis not present

## 2019-08-13 DIAGNOSIS — Z882 Allergy status to sulfonamides status: Secondary | ICD-10-CM | POA: Diagnosis not present

## 2019-08-19 DIAGNOSIS — Z1231 Encounter for screening mammogram for malignant neoplasm of breast: Secondary | ICD-10-CM | POA: Diagnosis not present

## 2019-09-07 ENCOUNTER — Ambulatory Visit (INDEPENDENT_AMBULATORY_CARE_PROVIDER_SITE_OTHER): Payer: Medicare PPO | Admitting: *Deleted

## 2019-09-07 DIAGNOSIS — I4430 Unspecified atrioventricular block: Secondary | ICD-10-CM | POA: Diagnosis not present

## 2019-09-07 LAB — CUP PACEART REMOTE DEVICE CHECK
Battery Remaining Longevity: 78 mo
Battery Voltage: 3 V
Brady Statistic AP VP Percent: 52.38 %
Brady Statistic AP VS Percent: 2.13 %
Brady Statistic AS VP Percent: 13.92 %
Brady Statistic AS VS Percent: 31.57 %
Brady Statistic RA Percent Paced: 54.61 %
Brady Statistic RV Percent Paced: 66.3 %
Date Time Interrogation Session: 20210705234523
Implantable Lead Implant Date: 20190923
Implantable Lead Implant Date: 20190923
Implantable Lead Location: 753859
Implantable Lead Location: 753860
Implantable Lead Model: 3830
Implantable Lead Model: 5076
Implantable Pulse Generator Implant Date: 20190923
Lead Channel Impedance Value: 323 Ohm
Lead Channel Impedance Value: 342 Ohm
Lead Channel Impedance Value: 399 Ohm
Lead Channel Impedance Value: 418 Ohm
Lead Channel Pacing Threshold Amplitude: 0.5 V
Lead Channel Pacing Threshold Amplitude: 2.25 V
Lead Channel Pacing Threshold Pulse Width: 0.4 ms
Lead Channel Pacing Threshold Pulse Width: 0.4 ms
Lead Channel Sensing Intrinsic Amplitude: 1.375 mV
Lead Channel Sensing Intrinsic Amplitude: 1.375 mV
Lead Channel Sensing Intrinsic Amplitude: 10.5 mV
Lead Channel Sensing Intrinsic Amplitude: 10.5 mV
Lead Channel Setting Pacing Amplitude: 1.5 V
Lead Channel Setting Pacing Amplitude: 3 V
Lead Channel Setting Pacing Pulse Width: 1 ms
Lead Channel Setting Sensing Sensitivity: 1.2 mV

## 2019-09-08 NOTE — Progress Notes (Signed)
Remote pacemaker transmission.   

## 2019-09-29 ENCOUNTER — Encounter: Payer: Self-pay | Admitting: Internal Medicine

## 2019-09-29 ENCOUNTER — Other Ambulatory Visit: Payer: Self-pay

## 2019-09-29 ENCOUNTER — Ambulatory Visit (INDEPENDENT_AMBULATORY_CARE_PROVIDER_SITE_OTHER): Payer: Medicare PPO | Admitting: Internal Medicine

## 2019-09-29 DIAGNOSIS — I4430 Unspecified atrioventricular block: Secondary | ICD-10-CM

## 2019-09-29 LAB — CUP PACEART INCLINIC DEVICE CHECK
Battery Remaining Longevity: 79 mo
Battery Voltage: 2.99 V
Brady Statistic AP VP Percent: 52.06 %
Brady Statistic AP VS Percent: 1.84 %
Brady Statistic AS VP Percent: 18.73 %
Brady Statistic AS VS Percent: 27.36 %
Brady Statistic RA Percent Paced: 54.01 %
Brady Statistic RV Percent Paced: 70.79 %
Date Time Interrogation Session: 20210728153500
Implantable Lead Implant Date: 20190923
Implantable Lead Implant Date: 20190923
Implantable Lead Location: 753859
Implantable Lead Location: 753860
Implantable Lead Model: 3830
Implantable Lead Model: 5076
Implantable Pulse Generator Implant Date: 20190923
Lead Channel Impedance Value: 342 Ohm
Lead Channel Impedance Value: 399 Ohm
Lead Channel Impedance Value: 437 Ohm
Lead Channel Impedance Value: 437 Ohm
Lead Channel Pacing Threshold Amplitude: 0.5 V
Lead Channel Pacing Threshold Amplitude: 1.75 V
Lead Channel Pacing Threshold Pulse Width: 0.4 ms
Lead Channel Pacing Threshold Pulse Width: 1 ms
Lead Channel Sensing Intrinsic Amplitude: 1.6 mV
Lead Channel Sensing Intrinsic Amplitude: 14.4 mV
Lead Channel Setting Pacing Amplitude: 1.5 V
Lead Channel Setting Pacing Amplitude: 3 V
Lead Channel Setting Pacing Pulse Width: 1 ms
Lead Channel Setting Sensing Sensitivity: 1.2 mV

## 2019-09-29 NOTE — Patient Instructions (Signed)
Medication Instructions:  Your physician recommends that you continue on your current medications as directed. Please refer to the Current Medication list given to you today.  *If you need a refill on your cardiac medications before your next appointment, please call your pharmacy*  Lab Work: None ordered.  If you have labs (blood work) drawn today and your tests are completely normal, you will receive your results only by: Marland Kitchen MyChart Message (if you have MyChart) OR . A paper copy in the mail If you have any lab test that is abnormal or we need to change your treatment, we will call you to review the results.  Testing/Procedures: None ordered.  Follow-Up: At [facility], you and your health needs are our priority.  As part of our continuing mission to provide you with exceptional heart care, we have created designated Provider Care Teams.  These Care Teams include your primary Cardiologist (physician) and Advanced Practice Providers (APPs -  Physician Assistants and Nurse Practitioners) who all work together to provide you with the care you need, when you need it.  We recommend signing up for the patient portal called "MyChart".  Sign up information is provided on this After Visit Summary.  MyChart is used to connect with patients for Virtual Visits (Telemedicine).  Patients are able to view lab/test results, encounter notes, upcoming appointments, etc.  Non-urgent messages can be sent to your provider as well.   To learn more about what you can do with MyChart, go to [email or web address].    Your next appointment:   Your physician wants you to follow-up in: 1 year with Dr. Lovena Le. You will receive a reminder letter in the mail two months in advance. If you don't receive a letter, please call our office to schedule the follow-up appointment.  Remote monitoring is used to monitor your Pacemaker from home. This monitoring reduces the number of office visits required to check your device  to one time per year. It allows [place] to keep an eye on the functioning of your device to ensure it is working properly. You are scheduled for a device check from home on 12/07/2019. You may send your transmission at any time that day. If you have a wireless device, the transmission will be sent automatically. After your physician reviews your transmission, you will receive a postcard with your next transmission date.  Other Instructions:

## 2019-09-29 NOTE — Progress Notes (Signed)
HPI Lauren Holloway returns today for followup. She is a pleasant 86 yo woman with a h/o symptomatic 2:1 AV block, who is s/p PPM insertion. She has HTN. She denies chest pain or sob. No edema. No syncope.  Allergies  Allergen Reactions  . Sulfonamide Derivatives     HIVES   . Sulfasalazine     Other reaction(s): Other (See Comments) HIVES     Current Outpatient Medications  Medication Sig Dispense Refill  . ALPRAZolam (XANAX) 0.5 MG tablet Take 0.25 mg by mouth 2 (two) times daily as needed for anxiety or sleep.  3  . amLODipine (NORVASC) 10 MG tablet Take 5 mg by mouth at bedtime.    Marland Kitchen escitalopram (LEXAPRO) 5 MG tablet Take 2.5 mg by mouth daily.     . fexofenadine (ALLEGRA) 60 MG tablet Take 60 mg by mouth 2 (two) times daily.    . Fish Oil-Cholecalciferol (FISH OIL + D3 PO) Take 1 tablet by mouth daily.     Marland Kitchen ibuprofen (ADVIL,MOTRIN) 400 MG tablet Take 400 mg by mouth every 6 (six) hours as needed for headache, mild pain or moderate pain.    Marland Kitchen lisinopril-hydrochlorothiazide (PRINZIDE,ZESTORETIC) 20-25 MG per tablet Take 1 tablet by mouth daily.    . metoprolol tartrate (LOPRESSOR) 25 MG tablet Take 25 mg by mouth 2 (two) times daily.     . Multiple Vitamins-Minerals (MULTIVITAMIN WITH MINERALS) tablet Take 1 tablet by mouth daily.    . potassium chloride (KLOR-CON) 10 MEQ tablet Take 1 tablet by mouth daily.    . predniSONE (DELTASONE) 20 MG tablet Take 1 tablet (20 mg total) by mouth 2 (two) times daily with a meal. After 5 days take one a day until gone 15 tablet 0  . rosuvastatin (CRESTOR) 5 MG tablet Take 5 mg by mouth at bedtime.     . torsemide (DEMADEX) 20 MG tablet Take 1 tablet by mouth every morning.    . traMADol (ULTRAM) 50 MG tablet Take 1 tablet (50 mg total) by mouth every 6 (six) hours as needed. 30 tablet 1  . VENTOLIN HFA 108 (90 BASE) MCG/ACT inhaler Inhale 1 puff into the lungs 2 (two) times daily as needed for wheezing.      No current  facility-administered medications for this visit.     Past Medical History:  Diagnosis Date  . Cataract   . Hyperlipidemia   . Hypertension     ROS:   All systems reviewed and negative except as noted in the HPI.   Past Surgical History:  Procedure Laterality Date  . ABDOMINAL HYSTERECTOMY    . APPENDECTOMY    . BILATERAL OOPHORECTOMY    . CATARACT EXTRACTION, BILATERAL Bilateral 1996  . PACEMAKER IMPLANT N/A 11/24/2017   Procedure: PACEMAKER IMPLANT;  Surgeon: Evans Lance, MD;  Location: [facility];  Service: Cardiovascular;  Laterality: N/A;     Family History  Problem Relation Age of Onset  . Ovarian cancer Mother   . Hypertension Mother   . Hypertension Father   . Heart disease Father   . Hypertension Sister   . Hypertension Sister      Social History   Socioeconomic History  . Marital status: Widowed    Spouse name: Not on file  . Number of children: 1  . Years of education: Not on file  . Highest education level: Not on file  Occupational History  . Not on file  Tobacco Use  .  Smoking status: Never Smoker  . Smokeless tobacco: Never Used  Vaping Use  . Vaping Use: Never used  Substance and Sexual Activity  . Alcohol use: Yes    Comment: MIX OCCASIONALLY  . Drug use: No  . Sexual activity: Not on file  Other Topics Concern  . Not on file  Social History Narrative  . Not on file   Social Determinants of Health   Financial Resource Strain:   . Difficulty of Paying Living Expenses:   Food Insecurity:   . Worried About Charity fundraiser in the Last Year:   . Arboriculturist in the Last Year:   Transportation Needs:   . Film/video editor (Medical):   Marland Kitchen Lack of Transportation (Non-Medical):   Physical Activity:   . Days of Exercise per Week:   . Minutes of Exercise per Session:   Stress:   . Feeling of Stress :   Social Connections:   . Frequency of Communication with Friends and Family:   . Frequency of Social  Gatherings with Friends and Family:   . Attends Religious Services:   . Active Member of Clubs or Organizations:   . Attends Archivist Meetings:   Marland Kitchen Marital Status:   Intimate Partner Violence:   . Fear of Current or Ex-Partner:   . Emotionally Abused:   Marland Kitchen Physically Abused:   . Sexually Abused:      BP (!) 110/56   Pulse 60   Ht 5\' 4"  (1.626 m)   Wt 158 lb 9.6 oz (71.9 kg)   SpO2 96%   BMI 27.22 kg/m   Physical Exam:  Well appearing NAD HEENT: Unremarkable Neck:  No JVD, no thyromegally Lymphatics:  No adenopathy Back:  No CVA tenderness Lungs:  Clear with no wheezes HEART:  Regular rate rhythm, no murmurs, no rubs, no clicks Abd:  soft, positive bowel sounds, no organomegally, no rebound, no guarding Ext:  2 plus pulses, no edema, no cyanosis, no clubbing Skin:  No rashes no nodules Neuro:  CN II through XII intact, motor grossly intact  EKG - NSR with ventricular pacing  DEVICE  Normal device function.  See PaceArt for details.   Assess/Plan: 1. 2:1 AV block - she is asymptomatic, s/p PPM insertion. 2. HTN - her bp is controlled. I considered cutting her meds back but will hold off on this for now. 3. PPM - her [employer]onic DDD PM is working normally. Her His bundle lead threshold is 1.5 at 1 ms. I have reprogrammed her device to pace only as needed. Today she is conducting once our AV delay was increased. 4. Dyslipidemia - she will continue her statin therapy.   [email or web address] [email or web address].D.

## 2019-10-01 NOTE — Addendum Note (Signed)
Addended by: Rose Phi on: 10/01/2019 09:48 AM   Modules accepted: Orders

## 2019-11-20 IMAGING — CT CT HEAD W/O CM
4 series · 15 of 47 positions shown, 17 images · non-contrast
Comparison: None.

CLINICAL DATA: Left forehead injury after syncope and fall today.
No loss of consciousness.

EXAM:
CT HEAD WITHOUT CONTRAST
TECHNIQUE: Contiguous axial images were obtained from the base of the skull
through the vertex without intravenous contrast.

[Series 3: head without · axial · non-contrast · 0.46mm/px · z∈[-113,+7]mm · 7 of 32 slices shown, 9 images]
[im 4/32  brain]
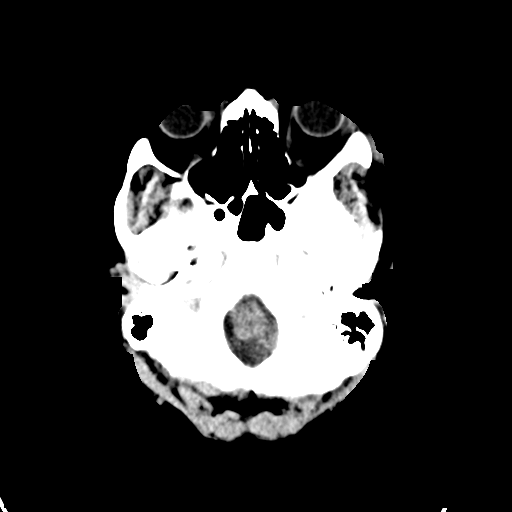
[im 4/32  bone]
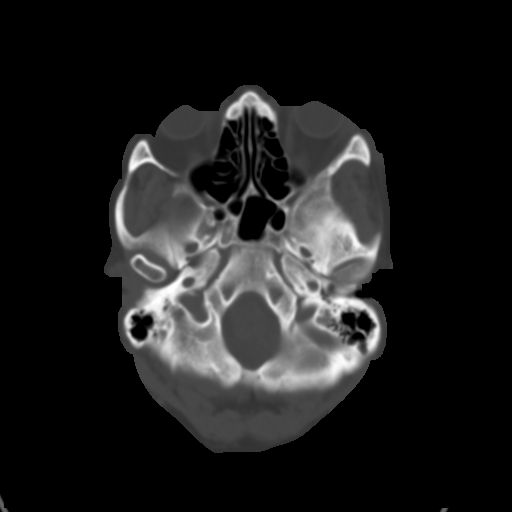
[im 8/32  brain]
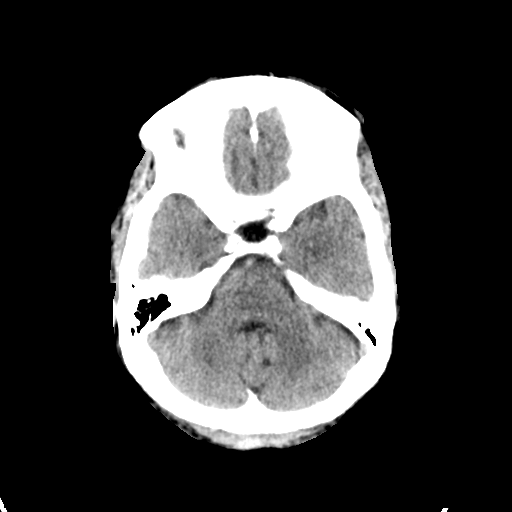
[im 12/32  brain]
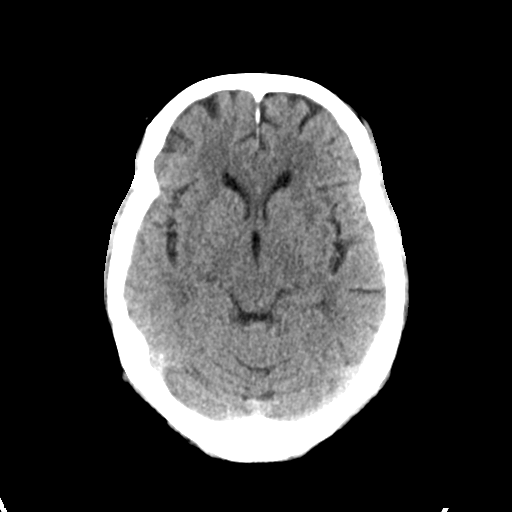
[im 16/32  brain]
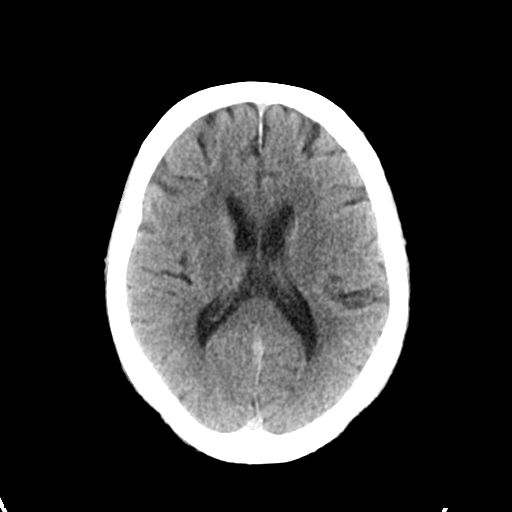
[im 20/32  brain]
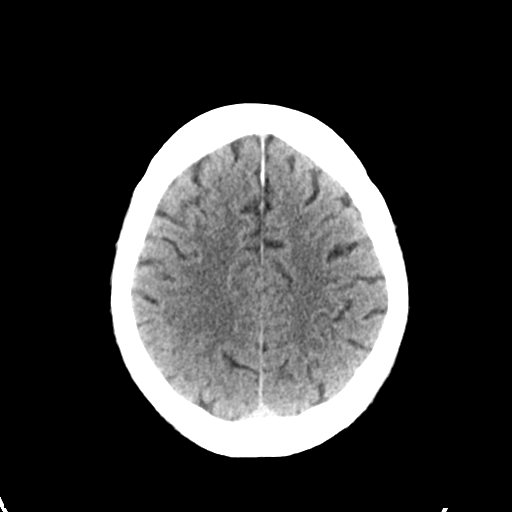
[im 20/32  bone]
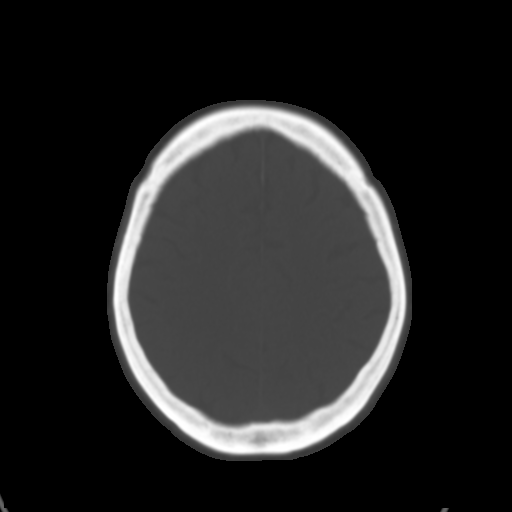
[im 24/32  brain]
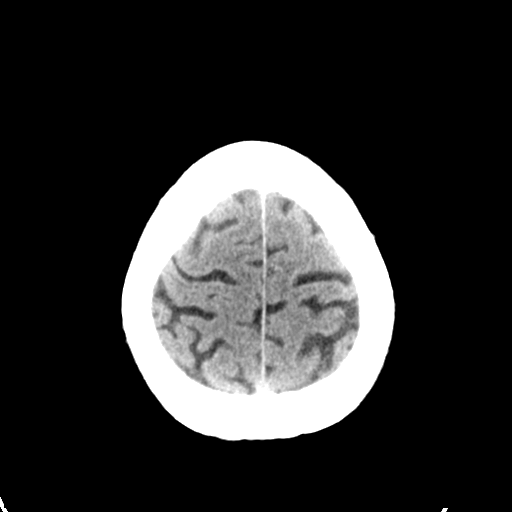
[im 28/32  brain]
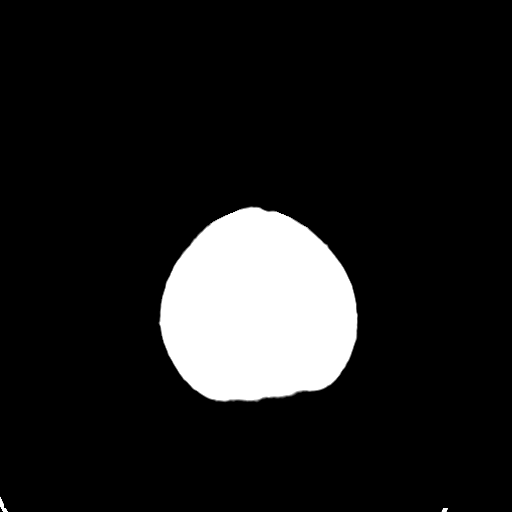

[Series 4: head bone · axial · 0.46mm/px · z∈[-114,-98]mm · 2 of 80 slices shown]
[im 8/80  bone]
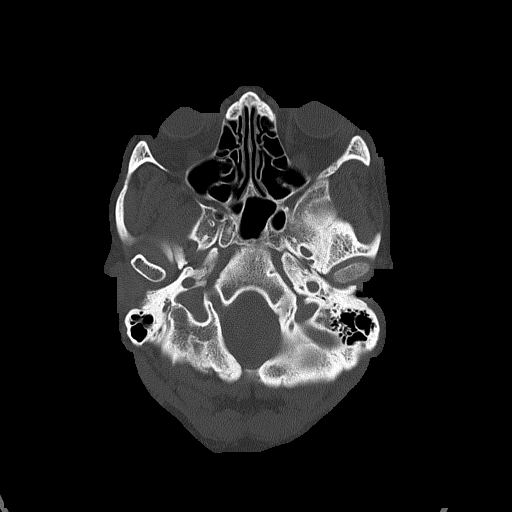
[im 16/80  bone]
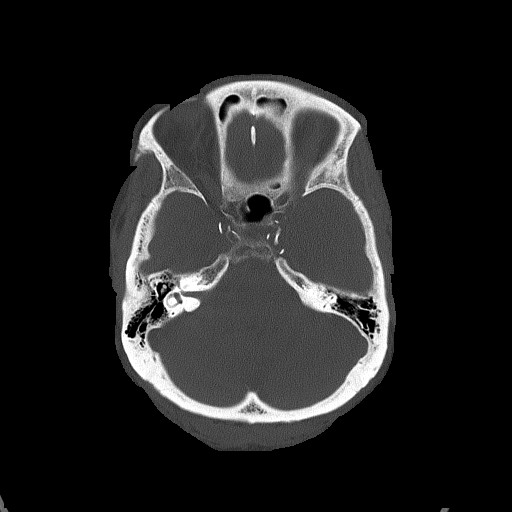

[Series 5: head without cor · coronal · non-contrast · 0.30mm/px · 3 of 73 slices shown]
[im 25/73  brain]
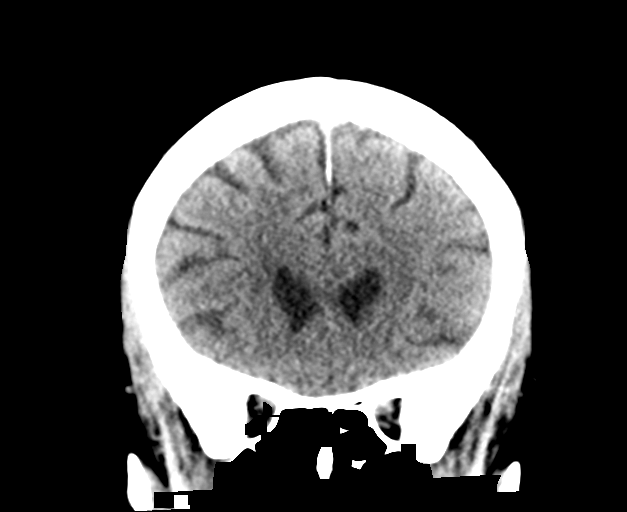
[im 33/73  brain]
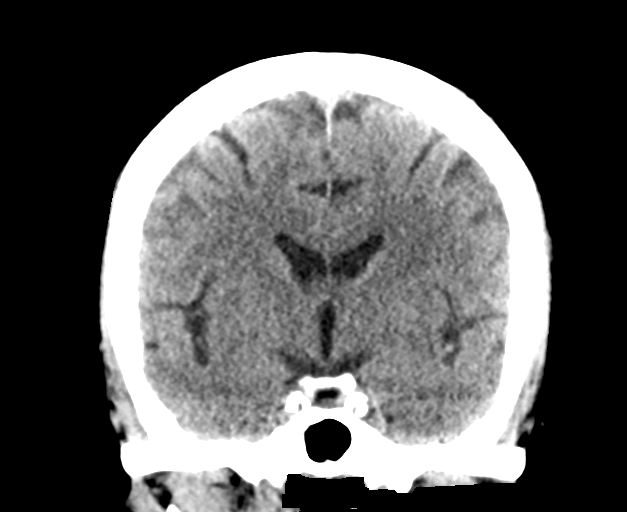
[im 41/73  brain]
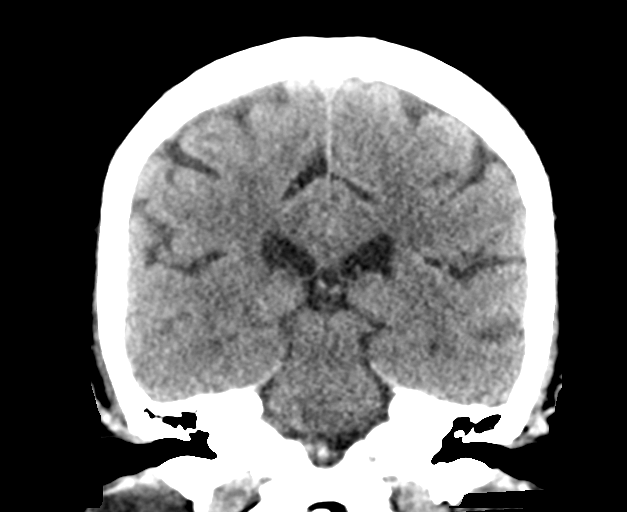

[Series 6: head without sag · sagittal · non-contrast · 0.31mm/px · 3 of 63 slices shown]
[im 21/63  brain]
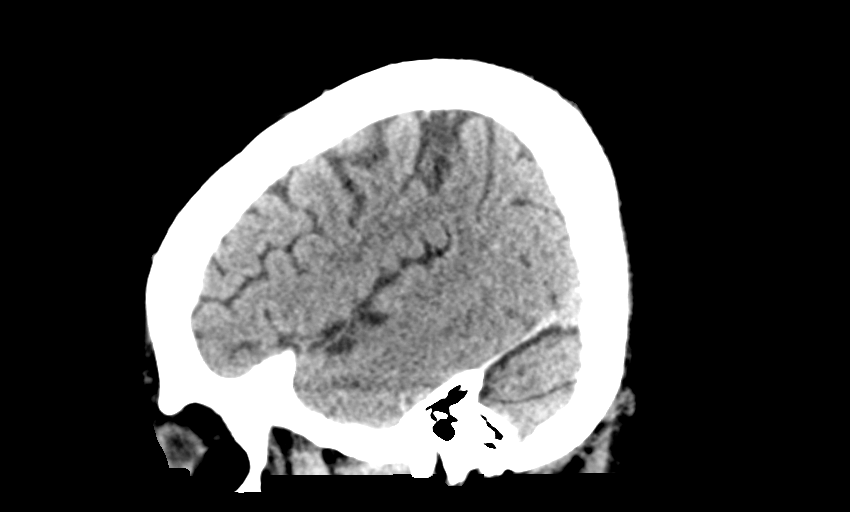
[im 32/63  brain]
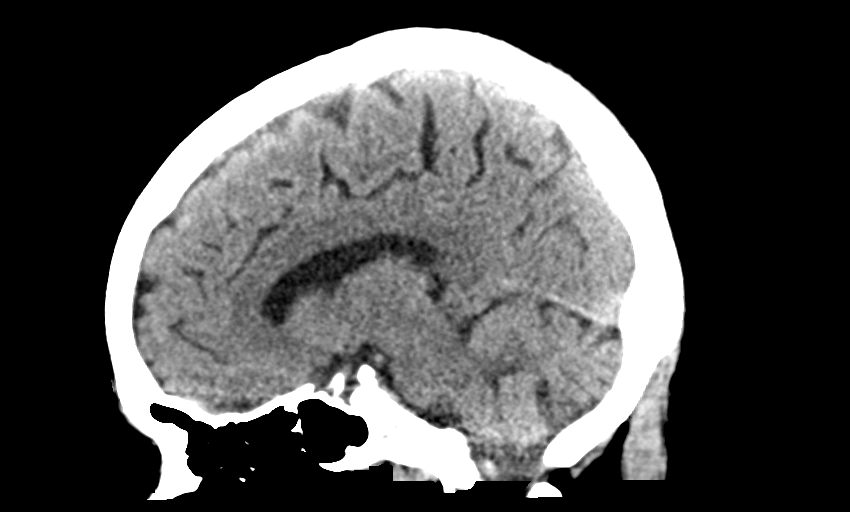
[im 42/63  brain]
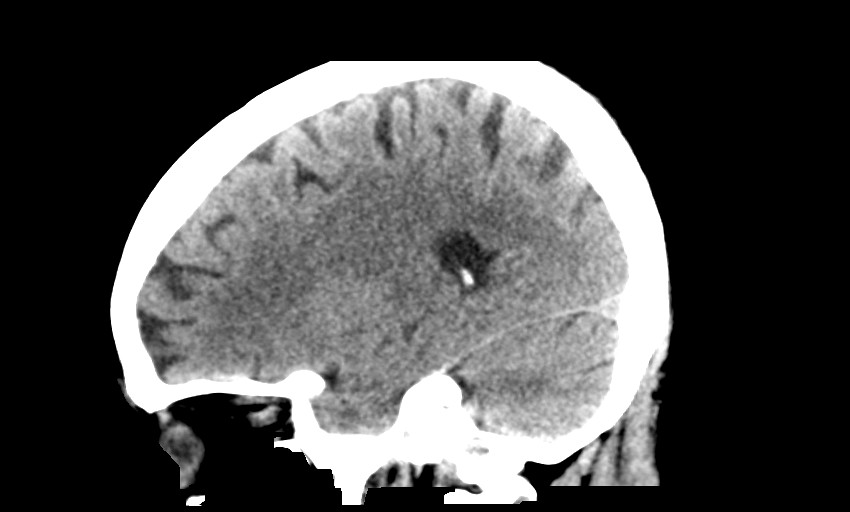

[15 of 47 positions shown; findings below may reference images not displayed]

FINDINGS: Brain: Mild chronic ischemic white matter disease is noted. No mass
effect or midline shift is noted. Ventricular size is within normal
limits. There is no evidence of mass lesion, hemorrhage or acute
infarction.

Vascular: No hyperdense vessel or unexpected calcification.

Skull: Normal. Negative for fracture or focal lesion.

Sinuses/Orbits: No acute finding.

Other: Small left frontal scalp hematoma is noted.
IMPRESSION: Small left frontal scalp hematoma. Mild chronic ischemic white
matter disease. No acute intracranial abnormality seen.

## 2019-11-23 ENCOUNTER — Telehealth: Payer: Self-pay

## 2019-11-23 NOTE — Telephone Encounter (Signed)
Patient apt. scheduled for 11/25/19 at 2:30. Advised to come to [place].

## 2019-11-23 NOTE — Telephone Encounter (Signed)
The pt is returning nurse phone call. She would like for the nurse to call her back.

## 2019-11-23 NOTE — Telephone Encounter (Signed)
[facility] alert received 11/23/19 for Alert high RV threshold. Known has been 2-2.25V, now >2.5V set to monitor. Rv threshold has been elevated. Wanted to bring patient into device clinic for manual testing. Called patient, left message with family to have patient call DC when she returns home. Direct phone number provided.  Per Dr. Tanna Furry last [place] on 09/29/19,  Her His bundle lead threshold is 1.5 at 1 ms.

## 2019-11-25 ENCOUNTER — Ambulatory Visit (INDEPENDENT_AMBULATORY_CARE_PROVIDER_SITE_OTHER): Payer: Medicare PPO | Admitting: Emergency Medicine

## 2019-11-25 ENCOUNTER — Other Ambulatory Visit: Payer: Self-pay

## 2019-11-25 DIAGNOSIS — I4430 Unspecified atrioventricular block: Secondary | ICD-10-CM | POA: Diagnosis not present

## 2019-11-25 LAB — CUP PACEART INCLINIC DEVICE CHECK
Battery Remaining Longevity: 79 mo
Battery Voltage: 3.01 V
Brady Statistic AP VP Percent: 1.05 %
Brady Statistic AP VS Percent: 55.24 %
Brady Statistic AS VP Percent: 1.83 %
Brady Statistic AS VS Percent: 41.88 %
Brady Statistic RA Percent Paced: 56.39 %
Brady Statistic RV Percent Paced: 2.88 %
Date Time Interrogation Session: 20210923145153
Implantable Lead Implant Date: 20190923
Implantable Lead Implant Date: 20190923
Implantable Lead Location: 753859
Implantable Lead Location: 753860
Implantable Lead Model: 3830
Implantable Lead Model: 5076
Implantable Pulse Generator Implant Date: 20190923
Lead Channel Impedance Value: 323 Ohm
Lead Channel Impedance Value: 323 Ohm
Lead Channel Impedance Value: 380 Ohm
Lead Channel Impedance Value: 380 Ohm
Lead Channel Impedance Value: 418 Ohm
Lead Channel Impedance Value: 418 Ohm
Lead Channel Impedance Value: 418 Ohm
Lead Channel Impedance Value: 418 Ohm
Lead Channel Pacing Threshold Amplitude: 0.5 V
Lead Channel Pacing Threshold Amplitude: 1.5 V
Lead Channel Pacing Threshold Pulse Width: 0.4 ms
Lead Channel Pacing Threshold Pulse Width: 1 ms
Lead Channel Sensing Intrinsic Amplitude: 1.75 mV
Lead Channel Sensing Intrinsic Amplitude: 1.75 mV
Lead Channel Sensing Intrinsic Amplitude: 13.375 mV
Lead Channel Sensing Intrinsic Amplitude: 13.375 mV
Lead Channel Setting Pacing Amplitude: 1.5 V
Lead Channel Setting Pacing Amplitude: 3 V
Lead Channel Setting Pacing Pulse Width: 1 ms
Lead Channel Setting Sensing Sensitivity: 1.2 mV

## 2019-11-25 NOTE — Progress Notes (Signed)
Pacemaker check in clinic. Normal device function. Patient came for inclinic check due reported elevated RV threshold.  RV threshold tested today 1.5V @ 1.37ms which is consistent with previous in clinic measurement.  Sensing, impedances consistent with previous measurements. Device programmed to maximize longevity. No mode switch or high ventricular rates noted. Device programmed at appropriate safety margins. Histogram distribution appropriate for patient activity level. Device programmed to optimize intrinsic conduction. Estimated longevity 6.4 years. Patient enrolled in remote follow-up, next scheduled check 12/07/19.  ROV 09/2020 with Dr. Lovena Le.  Patient education completed.

## 2019-12-07 ENCOUNTER — Ambulatory Visit (INDEPENDENT_AMBULATORY_CARE_PROVIDER_SITE_OTHER): Payer: Medicare PPO

## 2019-12-07 DIAGNOSIS — I4430 Unspecified atrioventricular block: Secondary | ICD-10-CM

## 2019-12-07 LAB — CUP PACEART REMOTE DEVICE CHECK
Battery Remaining Longevity: 77 mo
Battery Voltage: 3.01 V
Brady Statistic AP VP Percent: 0.13 %
Brady Statistic AP VS Percent: 63.97 %
Brady Statistic AS VP Percent: 0.01 %
Brady Statistic AS VS Percent: 35.89 %
Brady Statistic RA Percent Paced: 64.18 %
Brady Statistic RV Percent Paced: 0.14 %
Date Time Interrogation Session: 20211005060016
Implantable Lead Implant Date: 20190923
Implantable Lead Implant Date: 20190923
Implantable Lead Location: 753859
Implantable Lead Location: 753860
Implantable Lead Model: 3830
Implantable Lead Model: 5076
Implantable Pulse Generator Implant Date: 20190923
Lead Channel Impedance Value: 304 Ohm
Lead Channel Impedance Value: 342 Ohm
Lead Channel Impedance Value: 380 Ohm
Lead Channel Impedance Value: 399 Ohm
Lead Channel Pacing Threshold Amplitude: 0.5 V
Lead Channel Pacing Threshold Amplitude: 2.375 V
Lead Channel Pacing Threshold Pulse Width: 0.4 ms
Lead Channel Pacing Threshold Pulse Width: 0.4 ms
Lead Channel Sensing Intrinsic Amplitude: 1.125 mV
Lead Channel Sensing Intrinsic Amplitude: 1.125 mV
Lead Channel Sensing Intrinsic Amplitude: 10.75 mV
Lead Channel Sensing Intrinsic Amplitude: 10.75 mV
Lead Channel Setting Pacing Amplitude: 1.5 V
Lead Channel Setting Pacing Amplitude: 3 V
Lead Channel Setting Pacing Pulse Width: 1 ms
Lead Channel Setting Sensing Sensitivity: 1.2 mV

## 2019-12-08 DIAGNOSIS — E785 Hyperlipidemia, unspecified: Secondary | ICD-10-CM | POA: Diagnosis not present

## 2019-12-08 DIAGNOSIS — R7301 Impaired fasting glucose: Secondary | ICD-10-CM | POA: Diagnosis not present

## 2019-12-10 NOTE — Progress Notes (Signed)
Remote pacemaker transmission.   

## 2019-12-14 DIAGNOSIS — I10 Essential (primary) hypertension: Secondary | ICD-10-CM | POA: Diagnosis not present

## 2019-12-14 DIAGNOSIS — H8110 Benign paroxysmal vertigo, unspecified ear: Secondary | ICD-10-CM | POA: Diagnosis not present

## 2019-12-14 DIAGNOSIS — R82998 Other abnormal findings in urine: Secondary | ICD-10-CM | POA: Diagnosis not present

## 2019-12-14 DIAGNOSIS — Z0000 Encounter for general adult medical examination without abnormal findings: Secondary | ICD-10-CM | POA: Diagnosis not present

## 2019-12-14 DIAGNOSIS — H6123 Impacted cerumen, bilateral: Secondary | ICD-10-CM | POA: Diagnosis not present

## 2019-12-14 DIAGNOSIS — E8940 Asymptomatic postprocedural ovarian failure: Secondary | ICD-10-CM | POA: Diagnosis not present

## 2019-12-14 DIAGNOSIS — E876 Hypokalemia: Secondary | ICD-10-CM | POA: Diagnosis not present

## 2019-12-14 DIAGNOSIS — F329 Major depressive disorder, single episode, unspecified: Secondary | ICD-10-CM | POA: Diagnosis not present

## 2019-12-14 DIAGNOSIS — Z950 Presence of cardiac pacemaker: Secondary | ICD-10-CM | POA: Diagnosis not present

## 2019-12-14 DIAGNOSIS — E785 Hyperlipidemia, unspecified: Secondary | ICD-10-CM | POA: Diagnosis not present

## 2019-12-15 DIAGNOSIS — Z1212 Encounter for screening for malignant neoplasm of rectum: Secondary | ICD-10-CM | POA: Diagnosis not present

## 2019-12-20 NOTE — Progress Notes (Deleted)
{Choose 1 Note Type (Video or Telephone):[phone]}    Date:  12/20/2019   ID:  Lauren Holloway, DOB 26-Oct-1931, MRN [id number] The patient was identified using 2 identifiers.  {Patient Location:[phone]::"Home"} {Provider Location:[phone]::"Home Office"}  PCP:  Lauren Infante, MD  Cardiologist:  Lauren Rouge, MD  Electrophysiologist:  None   Evaluation Performed:  {Choose Visit [phone]::"Follow-Up Visit"}  Chief Complaint:  ***  History of Present Illness:    Lauren Holloway is a 86 y.o. female with ***  The patient {does/ Initially referred by Dr Lauren Holloway in 05/31/13  for dyspnea  She is a non smoker thought to be Some reactive airway disease. Improved with Qvar. TTE done 10/29/13 EF 60-65% estimated PA stystolic pressure 38 mmHg 2:1 AV block, who is s/p PPM insertion.   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    Past Medical History:  Diagnosis Date  . Cataract   . Hyperlipidemia   . Hypertension    Past Surgical History:  Procedure Laterality Date  . ABDOMINAL HYSTERECTOMY    . APPENDECTOMY    . BILATERAL OOPHORECTOMY    . CATARACT EXTRACTION, BILATERAL Bilateral 1996  . PACEMAKER IMPLANT N/A 11/24/2017   Procedure: PACEMAKER IMPLANT;  Surgeon: Lauren Lance, MD;  Location: [place] [facility];  Service: Cardiovascular;  Laterality: N/A;     No outpatient medications have been marked as taking for the 12/21/19 encounter (Appointment) with Lauren Serge, NP.     Allergies:   Sulfonamide derivatives and Sulfasalazine   Social History   Tobacco Use  . Smoking status: Never Smoker  . Smokeless tobacco: Never Used  Vaping Use  . Vaping Use: Never used  Substance Use Topics  . Alcohol  use: Yes    Comment: MIX OCCASIONALLY  . Drug use: No     Family Hx: The patient's family history includes Heart disease in her father; Hypertension in her father, mother, sister, and sister; Ovarian cancer in her mother.  ROS:   Please see the history of present illness.    *** All other systems reviewed and are negative.   Prior CV studies:   The following studies were reviewed today:  ***  Labs/Other Tests and Data Reviewed:    EKG:  {EKG/Telemetry Strips Reviewed:[phone]}  Recent Labs: No results found for requested labs within last 8760 hours.   Recent Lipid Panel No results found for: CHOL, TRIG, HDL, CHOLHDL, LDLCALC, LDLDIRECT  Wt Readings from Last 3 Encounters:  09/29/19 158 lb 9.6 oz (71.9 kg)  03/10/18 171 lb (77.6 kg)  11/25/17 156 lb 6.4 oz (70.9 kg)     Risk Assessment/Calculations:   {Does this patient have ATRIAL FIBRILLATION?:[phone]}  Objective:    Vital Signs:  There were no vitals taken for this visit.   {HeartCare Virtual Exam (Optional):[phone]::"VITAL SIGNS:  reviewed"}  ASSESSMENT & PLAN:    1. ***  COVID-19 Education: The signs and symptoms of COVID-19 were discussed with the patient and how to seek care for testing (follow up with PCP or arrange E-visit).  ***The importance of social distancing was discussed today.  Time:   Today, I have spent *** minutes with the patient with telehealth technology discussing the above problems.  Medication Adjustments/Labs and Tests Ordered: Current medicines are reviewed at length with the patient today.  Concerns regarding medicines are outlined above.   Tests Ordered: No orders of the defined types were placed in this encounter.   Medication Changes: No orders of the defined types were placed in this encounter.   Follow Up:  {F/U [phone]} {follow up:15908}  Signed, Lauren Kicks, NP  12/20/2019 9:42 PM    [facility]

## 2019-12-21 ENCOUNTER — Telehealth: Payer: Self-pay | Admitting: *Deleted

## 2019-12-21 ENCOUNTER — Telehealth: Payer: Medicare PPO | Admitting: Cardiology

## 2019-12-21 ENCOUNTER — Other Ambulatory Visit: Payer: Self-pay

## 2019-12-21 ENCOUNTER — Encounter: Payer: Self-pay | Admitting: Cardiology

## 2019-12-21 ENCOUNTER — Telehealth (INDEPENDENT_AMBULATORY_CARE_PROVIDER_SITE_OTHER): Payer: Medicare PPO | Admitting: Cardiology

## 2019-12-21 VITALS — BP 134/72 | HR 72 | Ht 64.0 in | Wt 158.0 lb

## 2019-12-21 DIAGNOSIS — I441 Atrioventricular block, second degree: Secondary | ICD-10-CM

## 2019-12-21 DIAGNOSIS — I10 Essential (primary) hypertension: Secondary | ICD-10-CM | POA: Diagnosis not present

## 2019-12-21 DIAGNOSIS — R0600 Dyspnea, unspecified: Secondary | ICD-10-CM | POA: Diagnosis not present

## 2019-12-21 DIAGNOSIS — E782 Mixed hyperlipidemia: Secondary | ICD-10-CM

## 2019-12-21 DIAGNOSIS — Z950 Presence of cardiac pacemaker: Secondary | ICD-10-CM | POA: Diagnosis not present

## 2019-12-21 DIAGNOSIS — R0609 Other forms of dyspnea: Secondary | ICD-10-CM

## 2019-12-21 NOTE — Telephone Encounter (Signed)
Called to start virtual appt and to get verbal consent.  Pt had already left thinking it was an in office appointment.

## 2019-12-21 NOTE — Progress Notes (Signed)
Virtual Visit via Telephone Note   This visit type was conducted due to national recommendations for restrictions regarding the COVID-19 Pandemic (e.g. social distancing) in an effort to limit this patient's exposure and mitigate transmission in our community.  Due to her co-morbid illnesses, this patient is at least at moderate risk for complications without adequate follow up.  This format is felt to be most appropriate for this patient at this time.  The patient did not have access to video technology/had technical difficulties with video requiring transitioning to audio format only (telephone).  All issues noted in this document were discussed and addressed.  No physical exam could be performed with this format.  Please refer to the patient's chart for her  consent to telehealth for [facility].    Date:  12/21/2019   ID:  Lauren Holloway, DOB 27-Sep-1931, MRN [id number] The patient was identified using 2 identifiers.  Patient Location: Home Provider Location: Home Office  PCP:  [name], MD  Cardiologist:  [name], MD  Electrophysiologist:  None   Evaluation Performed:  Follow-Up Visit  Chief Complaint:  dyspnea  History of Present Illness:    Lauren Holloway is a 86 y.o. female Initially referred by Dr Joylene Draft in 2013-06-16 for dyspnea She is a non smoker thought to be Some reactive airway disease. Improved with Qvar. TTE done 10/29/13 EF 60-65% estimated PA stystolic pressure 38 mmHg 2:1 AV block, and alternating RBBB and LBBB with near syncope who is s/p PPM insertion. followed by Dr. Lovena Le  Last pacer check 12/07/19 with histograms appropriate, leads and battery stable for pt, no changes.   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  She is intolerant to statins and  insurance is deciding on repatha. This is through Dr. Joylene Draft .  She has had some dizziness and will do ear crystal exercises.  She occ has lightheadedness when standing and her diuretic may need to be adjusted.  She has had her COVID vaccines and booster along with flu vaccine.   She has not been to gym due to covid but plans to return.  Diet is fairly healthy, occ edema.      The patient does not have symptoms concerning for COVID-19 infection (fever, chills, cough, or new shortness of breath).    Past Medical History:  Diagnosis Date  . Cataract   . Hyperlipidemia   . Hypertension    Past Surgical History:  Procedure Laterality Date  . ABDOMINAL HYSTERECTOMY    . APPENDECTOMY    . BILATERAL OOPHORECTOMY    . CATARACT EXTRACTION, BILATERAL Bilateral 1996  . PACEMAKER IMPLANT N/A 11/24/2017   Procedure: PACEMAKER IMPLANT;  Surgeon: Evans Lance, MD;  Location: [place] [facility];  Service: Cardiovascular;  Laterality: N/A;     Current Meds  Medication Sig  . ALPRAZolam (XANAX) 0.5 MG tablet Take 0.25 mg by mouth 2 (two) times daily as needed for anxiety or sleep.  Marland Kitchen amLODipine (NORVASC) 10 MG tablet Pt takes 1/2 tablet on Sundays & Thursdays and 1 tablet M, Tue, Wed, Jun 17, 2022, & Saturday  . escitalopram (LEXAPRO) 5 MG tablet Take 5 mg by mouth daily.   . Fish Oil-Cholecalciferol (FISH OIL + D3 PO) Take 1 tablet by mouth daily.   Marland Kitchen  ibuprofen (ADVIL,MOTRIN) 400 MG tablet Take 400 mg by mouth every 6 (six) hours as needed for headache, mild pain or moderate pain.  Marland Kitchen lisinopril-hydrochlorothiazide (PRINZIDE,ZESTORETIC) 20-25 MG per tablet Take 1 tablet by mouth daily.  . metoprolol tartrate (LOPRESSOR) 25 MG tablet Take 25 mg by mouth 2 (two) times daily.   . Multiple Vitamins-Minerals (MULTIVITAMIN WITH MINERALS) tablet Take 1 tablet by mouth daily.  . potassium chloride (KLOR-CON) 10 MEQ tablet Take 1 tablet by mouth daily.  Marland Kitchen torsemide (DEMADEX) 20 MG tablet Take 1 tablet by mouth  every morning.  . traMADol (ULTRAM) 50 MG tablet Take 1 tablet (50 mg total) by mouth every 6 (six) hours as needed.  . [DISCONTINUED] fexofenadine (ALLEGRA) 60 MG tablet Take 60 mg by mouth 2 (two) times daily.  . [DISCONTINUED] predniSONE (DELTASONE) 20 MG tablet Take 1 tablet (20 mg total) by mouth 2 (two) times daily with a meal. After 5 days take one a day until gone  . [DISCONTINUED] rosuvastatin (CRESTOR) 5 MG tablet Take 5 mg by mouth at bedtime.   . [DISCONTINUED] VENTOLIN HFA 108 (90 BASE) MCG/ACT inhaler Inhale 1 puff into the lungs 2 (two) times daily as needed for wheezing.      Allergies:   Sulfonamide derivatives, Latex, and Sulfasalazine   Social History   Tobacco Use  . Smoking status: Never Smoker  . Smokeless tobacco: Never Used  Vaping Use  . Vaping Use: Never used  Substance Use Topics  . Alcohol use: Yes    Comment: MIX OCCASIONALLY  . Drug use: No     Family Hx: The patient's family history includes Heart disease in her father; Hypertension in her father, mother, sister, and sister; Ovarian cancer in her mother.  ROS:   Please see the history of present illness.    General:no colds or fevers, no weight changes Skin:no rashes or ulcers HEENT:no blurred vision, no congestion CV:see HPI PUL:see HPI GI:no diarrhea constipation or melena, no indigestion GU:no hematuria, no dysuria MS:no joint pain, no claudication Neuro:no syncope, no lightheadedness, + room spinning  Endo:no diabetes, no thyroid disease  All other systems reviewed and are negative.   Prior CV studies:   The following studies were reviewed today:  Echo 11/21/17  - Left ventricle: The cavity size was normal. Wall thickness was  increased in a pattern of mild LVH. Systolic function was  vigorous. The estimated ejection fraction was in the range of 65%  to 70%. Wall motion was normal; there were no regional wall  motion abnormalities. The study is not technically sufficient to   allow evaluation of LV diastolic function.  - Aortic valve: There was trivial regurgitation.  - Mitral valve: There was mild regurgitation.  - Pulmonary arteries: Systolic pressure was moderately increased.  PA peak pressure: 53 mm Hg (S).   Impressions:   - Definity used; vigorous LV systolic function; mild LVH; trace AI;  mild MR; trace TR with moderate pulmonary hypertension.   Labs/Other Tests and Data Reviewed:    EKG:  No ECG reviewed.  Recent Labs: No results found for requested labs within last 8760 hours.   Recent Lipid Panel No results found for: CHOL, TRIG, HDL, CHOLHDL, LDLCALC, LDLDIRECT  Wt Readings from Last 3 Encounters:  12/21/19 158 lb (71.7 kg)  09/29/19 158 lb 9.6 oz (71.9 kg)  03/10/18 171 lb (77.6 kg)     Risk Assessment/Calculations:      Objective:    Vital Signs:  BP 134/72  Pulse 72   Ht 5\' 4"  (1.626 m)   Wt 158 lb (71.7 kg)   BMI 27.12 kg/m    VITAL SIGNS:  reviewed General NAD Pulmonary can speak in complete sentences without SOB  Neuro A&O X 3 answers questions approp  Assessment /Plan  1. Dyspnea resolved, normal echo in 2019 2. HTN controlled followed by PCP  3. HLD intolerant to statin, PCP arranging repatha 4. 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 Discussed why she needed  COVID-19 Education: The signs and symptoms of COVID-19 were discussed with the patient and how to seek care for testing (follow up with PCP or arrange E-visit).  The importance of social distancing was discussed today.  Time:   Today, I have spent 17 minutes with the patient with telehealth technology discussing the above problems.     Medication Adjustments/Labs and Tests Ordered: Current medicines are reviewed at length with the patient today.  Concerns regarding medicines are outlined above.   Tests Ordered: No orders of the defined types were placed in this encounter.   Medication Changes: No orders of the  defined types were placed in this encounter.   Follow Up:  In Person in 8 month(s)  Signed, Cecilie Kicks, NP  12/21/2019 10:41 AM    [place]

## 2019-12-21 NOTE — Patient Instructions (Addendum)
Medication Instructions:  .instvcu  *If you need a refill on your cardiac medications before your next appointment, please call your pharmacy*   Lab Work: None ordered  If you have labs (blood work) drawn today and your tests are completely normal, you will receive your results only by: Marland Kitchen MyChart Message (if you have MyChart) OR . A paper copy in the mail If you have any lab test that is abnormal or we need to change your treatment, we will call you to review the results.   Testing/Procedures: None ordered   Follow-Up: At [facility], you and your health needs are our priority.  As part of our continuing mission to provide you with exceptional heart care, we have created designated Provider Care Teams.  These Care Teams include your primary Cardiologist (physician) and Advanced Practice Providers (APPs -  Physician Assistants and Nurse Practitioners) who all work together to provide you with the care you need, when you need it.  We recommend signing up for the patient portal called "MyChart".  Sign up information is provided on this After Visit Summary.  MyChart is used to connect with patients for Virtual Visits (Telemedicine).  Patients are able to view lab/test results, encounter notes, upcoming appointments, etc.  Non-urgent messages can be sent to your provider as well.   To learn more about what you can do with MyChart, go to [email or web address].    Your next appointment:   8 month(s)  The format for your next appointment:   In Person  Provider:   [name], MD   Other Instructions

## 2019-12-21 NOTE — Telephone Encounter (Signed)
°  Patient Consent for Virtual Visit         Lauren Holloway has provided verbal consent on 12/21/2019 for a virtual visit (video or telephone).   CONSENT FOR VIRTUAL VISIT FOR:  Lauren Holloway  By participating in this virtual visit I agree to the following:  I hereby voluntarily request, consent and authorize [facility] and its employed or contracted physicians, physician assistants, nurse practitioners or other licensed health care professionals (the Practitioner), to provide me with telemedicine health care services (the Services") as deemed necessary by the treating Practitioner. I acknowledge and consent to receive the Services by the Practitioner via telemedicine. I understand that the telemedicine visit will involve communicating with the Practitioner through live audiovisual communication technology and the disclosure of certain medical information by electronic transmission. I acknowledge that I have been given the opportunity to request an in-person assessment or other available alternative prior to the telemedicine visit and am voluntarily participating in the telemedicine visit.  I understand that I have the right to withhold or withdraw my consent to the use of telemedicine in the course of my care at any time, without affecting my right to future care or treatment, and that the Practitioner or I may terminate the telemedicine visit at any time. I understand that I have the right to inspect all information obtained and/or recorded in the course of the telemedicine visit and may receive copies of available information for a reasonable fee.  I understand that some of the potential risks of receiving the Services via telemedicine include:   Delay or interruption in medical evaluation due to technological equipment failure or disruption;  Information transmitted may not be sufficient (e.g. poor resolution of images) to allow for appropriate medical decision making by the  Practitioner; and/or   In rare instances, security protocols could fail, causing a breach of personal health information.  Furthermore, I acknowledge that it is my responsibility to provide information about my medical history, conditions and care that is complete and accurate to the best of my ability. I acknowledge that Practitioner's advice, recommendations, and/or decision may be based on factors not within their control, such as incomplete or inaccurate data provided by me or distortions of diagnostic images or specimens that may result from electronic transmissions. I understand that the practice of medicine is not an exact science and that Practitioner makes no warranties or guarantees regarding treatment outcomes. I acknowledge that a copy of this consent can be made available to me via my patient portal ([facility]), or I can request a printed copy by calling the office of [facility].    I understand that my insurance will be billed for this visit.   I have read or had this consent read to me.  I understand the contents of this consent, which adequately explains the benefits and risks of the Services being provided via telemedicine.   I have been provided ample opportunity to ask questions regarding this consent and the Services and have had my questions answered to my satisfaction.  I give my informed consent for the services to be provided through the use of telemedicine in my medical care

## 2020-02-14 DIAGNOSIS — M545 Low back pain, unspecified: Secondary | ICD-10-CM | POA: Diagnosis not present

## 2020-02-14 DIAGNOSIS — B372 Candidiasis of skin and nail: Secondary | ICD-10-CM | POA: Diagnosis not present

## 2020-03-09 ENCOUNTER — Telehealth: Payer: Self-pay

## 2020-03-09 DIAGNOSIS — L638 Other alopecia areata: Secondary | ICD-10-CM | POA: Diagnosis not present

## 2020-03-09 LAB — CUP PACEART REMOTE DEVICE CHECK
Battery Remaining Longevity: 79 mo
Battery Voltage: 3.01 V
Brady Statistic AP VP Percent: 2.53 %
Brady Statistic AP VS Percent: 52.03 %
Brady Statistic AS VP Percent: 2.19 %
Brady Statistic AS VS Percent: 43.26 %
Brady Statistic RA Percent Paced: 54.89 %
Brady Statistic RV Percent Paced: 4.72 %
Date Time Interrogation Session: 20220106101201
Implantable Lead Implant Date: 20190923
Implantable Lead Implant Date: 20190923
Implantable Lead Location: 753859
Implantable Lead Location: 753860
Implantable Lead Model: 3830
Implantable Lead Model: 5076
Implantable Pulse Generator Implant Date: 20190923
Lead Channel Impedance Value: 323 Ohm
Lead Channel Impedance Value: 361 Ohm
Lead Channel Impedance Value: 399 Ohm
Lead Channel Impedance Value: 418 Ohm
Lead Channel Pacing Threshold Amplitude: 0.5 V
Lead Channel Pacing Threshold Amplitude: 2.125 V
Lead Channel Pacing Threshold Pulse Width: 0.4 ms
Lead Channel Pacing Threshold Pulse Width: 0.4 ms
Lead Channel Sensing Intrinsic Amplitude: 1.25 mV
Lead Channel Sensing Intrinsic Amplitude: 1.25 mV
Lead Channel Sensing Intrinsic Amplitude: 10.875 mV
Lead Channel Sensing Intrinsic Amplitude: 10.875 mV
Lead Channel Setting Pacing Amplitude: 1.5 V
Lead Channel Setting Pacing Amplitude: 3 V
Lead Channel Setting Pacing Pulse Width: 1 ms
Lead Channel Setting Sensing Sensitivity: 1.2 mV

## 2020-03-09 NOTE — Telephone Encounter (Signed)
The patient was frustrated because the lights on her monitor is staying on constantly. I let her know the monitor is light sensitive as long as the room light, sunlight, or moonlight is coming into the room then the monitor will stay on. If it is dark the monitor light will be off. As long as the light is not orange the monitor is working properly. I called tech support with the patient on the phone and they verified that what I told the patient.

## 2020-03-10 ENCOUNTER — Ambulatory Visit (INDEPENDENT_AMBULATORY_CARE_PROVIDER_SITE_OTHER): Payer: Medicare PPO

## 2020-03-10 DIAGNOSIS — I4430 Unspecified atrioventricular block: Secondary | ICD-10-CM

## 2020-03-24 NOTE — Progress Notes (Signed)
Remote pacemaker transmission.   

## 2020-06-09 ENCOUNTER — Ambulatory Visit (INDEPENDENT_AMBULATORY_CARE_PROVIDER_SITE_OTHER): Payer: Medicare PPO

## 2020-06-09 DIAGNOSIS — I4430 Unspecified atrioventricular block: Secondary | ICD-10-CM | POA: Diagnosis not present

## 2020-06-13 LAB — CUP PACEART REMOTE DEVICE CHECK
Battery Remaining Longevity: 79 mo
Battery Voltage: 3.01 V
Brady Statistic AP VP Percent: 9.49 %
Brady Statistic AP VS Percent: 51.04 %
Brady Statistic AS VP Percent: 8.65 %
Brady Statistic AS VS Percent: 30.82 %
Brady Statistic RA Percent Paced: 60.64 %
Brady Statistic RV Percent Paced: 18.13 %
Date Time Interrogation Session: 20220408122058
Implantable Lead Implant Date: 20190923
Implantable Lead Implant Date: 20190923
Implantable Lead Location: 753859
Implantable Lead Location: 753860
Implantable Lead Model: 3830
Implantable Lead Model: 5076
Implantable Pulse Generator Implant Date: 20190923
Lead Channel Impedance Value: 304 Ohm
Lead Channel Impedance Value: 361 Ohm
Lead Channel Impedance Value: 399 Ohm
Lead Channel Impedance Value: 418 Ohm
Lead Channel Pacing Threshold Amplitude: 0.5 V
Lead Channel Pacing Threshold Amplitude: 2.125 V
Lead Channel Pacing Threshold Pulse Width: 0.4 ms
Lead Channel Pacing Threshold Pulse Width: 0.4 ms
Lead Channel Sensing Intrinsic Amplitude: 1.25 mV
Lead Channel Sensing Intrinsic Amplitude: 1.25 mV
Lead Channel Sensing Intrinsic Amplitude: 10.375 mV
Lead Channel Sensing Intrinsic Amplitude: 10.375 mV
Lead Channel Setting Pacing Amplitude: 1.5 V
Lead Channel Setting Pacing Amplitude: 3 V
Lead Channel Setting Pacing Pulse Width: 1 ms
Lead Channel Setting Sensing Sensitivity: 1.2 mV

## 2020-06-15 DIAGNOSIS — H20022 Recurrent acute iridocyclitis, left eye: Secondary | ICD-10-CM | POA: Diagnosis not present

## 2020-06-15 DIAGNOSIS — H52203 Unspecified astigmatism, bilateral: Secondary | ICD-10-CM | POA: Diagnosis not present

## 2020-06-15 DIAGNOSIS — H524 Presbyopia: Secondary | ICD-10-CM | POA: Diagnosis not present

## 2020-06-15 DIAGNOSIS — Z961 Presence of intraocular lens: Secondary | ICD-10-CM | POA: Diagnosis not present

## 2020-06-22 NOTE — Progress Notes (Signed)
Remote pacemaker transmission.   

## 2020-06-23 DIAGNOSIS — M1990 Unspecified osteoarthritis, unspecified site: Secondary | ICD-10-CM | POA: Diagnosis not present

## 2020-06-23 DIAGNOSIS — K649 Unspecified hemorrhoids: Secondary | ICD-10-CM | POA: Diagnosis not present

## 2020-06-23 DIAGNOSIS — R7301 Impaired fasting glucose: Secondary | ICD-10-CM | POA: Diagnosis not present

## 2020-06-23 DIAGNOSIS — E785 Hyperlipidemia, unspecified: Secondary | ICD-10-CM | POA: Diagnosis not present

## 2020-06-23 DIAGNOSIS — Z950 Presence of cardiac pacemaker: Secondary | ICD-10-CM | POA: Diagnosis not present

## 2020-06-23 DIAGNOSIS — I10 Essential (primary) hypertension: Secondary | ICD-10-CM | POA: Diagnosis not present

## 2020-06-23 DIAGNOSIS — E876 Hypokalemia: Secondary | ICD-10-CM | POA: Diagnosis not present

## 2020-06-29 ENCOUNTER — Encounter: Payer: Self-pay | Admitting: Gastroenterology

## 2020-07-03 DIAGNOSIS — E785 Hyperlipidemia, unspecified: Secondary | ICD-10-CM | POA: Diagnosis not present

## 2020-07-03 DIAGNOSIS — R42 Dizziness and giddiness: Secondary | ICD-10-CM | POA: Diagnosis not present

## 2020-07-03 DIAGNOSIS — I10 Essential (primary) hypertension: Secondary | ICD-10-CM | POA: Diagnosis not present

## 2020-09-05 DIAGNOSIS — Z1231 Encounter for screening mammogram for malignant neoplasm of breast: Secondary | ICD-10-CM | POA: Diagnosis not present

## 2020-09-08 ENCOUNTER — Ambulatory Visit (INDEPENDENT_AMBULATORY_CARE_PROVIDER_SITE_OTHER): Payer: Medicare PPO

## 2020-09-08 DIAGNOSIS — I4430 Unspecified atrioventricular block: Secondary | ICD-10-CM

## 2020-09-10 LAB — CUP PACEART REMOTE DEVICE CHECK
Battery Remaining Longevity: 77 mo
Battery Voltage: 2.97 V
Brady Statistic AP VP Percent: 26.62 %
Brady Statistic AP VS Percent: 31.26 %
Brady Statistic AS VP Percent: 17.14 %
Brady Statistic AS VS Percent: 24.98 %
Brady Statistic RA Percent Paced: 57.99 %
Brady Statistic RV Percent Paced: 43.76 %
Date Time Interrogation Session: 20220708070407
Implantable Lead Implant Date: 20190923
Implantable Lead Implant Date: 20190923
Implantable Lead Location: 753859
Implantable Lead Location: 753860
Implantable Lead Model: 3830
Implantable Lead Model: 5076
Implantable Pulse Generator Implant Date: 20190923
Lead Channel Impedance Value: 323 Ohm
Lead Channel Impedance Value: 342 Ohm
Lead Channel Impedance Value: 399 Ohm
Lead Channel Impedance Value: 399 Ohm
Lead Channel Pacing Threshold Amplitude: 0.5 V
Lead Channel Pacing Threshold Amplitude: 1.875 V
Lead Channel Pacing Threshold Pulse Width: 0.4 ms
Lead Channel Pacing Threshold Pulse Width: 0.4 ms
Lead Channel Sensing Intrinsic Amplitude: 1.125 mV
Lead Channel Sensing Intrinsic Amplitude: 1.125 mV
Lead Channel Sensing Intrinsic Amplitude: 12.125 mV
Lead Channel Sensing Intrinsic Amplitude: 12.125 mV
Lead Channel Setting Pacing Amplitude: 1.5 V
Lead Channel Setting Pacing Amplitude: 3 V
Lead Channel Setting Pacing Pulse Width: 1 ms
Lead Channel Setting Sensing Sensitivity: 1.2 mV

## 2020-09-14 NOTE — Progress Notes (Signed)
Date:  09/22/2020   ID:  Lauren Holloway, DOB 09/01/1931, MRN [id number]  PCP:  [name], MD  Cardiologist:  [name], MD  Electrophysiologist:  None   Evaluation Performed:  Follow-Up Visit  Chief Complaint:  dyspnea  History of Present Illness:    Lauren Holloway is a 87 y.o. female Initially referred by Dr Joylene Draft in 05-23-2013  for dyspnea  She is a non smoker thought to beSome reactive airway disease. Improved with Qvar. TTE done 10/29/13 EF 60-65% estimated PA stystolic pressure 38 mmHg 2:1 AV block, and alternating RBBB and LBBB with near syncope who is s/p PPM insertion. followed by Dr. 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[employer])  She is intolerant to statins and now on Praluent   Still unable to give herself shots and [facility]   Past Medical History:  Diagnosis Date   Cataract    Hyperlipidemia    Hypertension    Past Surgical History:  Procedure Laterality Date   ABDOMINAL HYSTERECTOMY     APPENDECTOMY     BILATERAL OOPHORECTOMY     CATARACT EXTRACTION, BILATERAL Bilateral 1996   PACEMAKER IMPLANT N/A 11/24/2017   Procedure: PACEMAKER IMPLANT;  Surgeon: Evans Lance, MD;  Location: [place] [facility];  Service: Cardiovascular;  Laterality: N/A;     Current Meds  Medication Sig   Alirocumab (PRALUENT) 150 MG/ML SOAJ Inject 150 mg into the skin every 14 (fourteen) days.   ALPRAZolam (XANAX) 0.5 MG tablet Take 0.25 mg by mouth 2 (two) times daily as needed for anxiety or sleep.   amLODipine (NORVASC) 10 MG tablet Pt takes 1/2 tablet on Sundays & Thursdays and 1 tablet M, 2022/05/24, Wed, Friday, & Saturday   escitalopram (LEXAPRO) 5 MG tablet Take 5 mg by mouth daily.    Fish Oil-Cholecalciferol (FISH OIL + D3 PO) Take 1 tablet by mouth daily.    ibuprofen (ADVIL,MOTRIN) 400 MG tablet Take 400 mg by mouth  every 6 (six) hours as needed for headache, mild pain or moderate pain.   lisinopril-hydrochlorothiazide (PRINZIDE,ZESTORETIC) 20-25 MG per tablet Take 1 tablet by mouth daily.   metoprolol tartrate (LOPRESSOR) 25 MG tablet Take 25 mg by mouth 2 (two) times daily.    Multiple Vitamins-Minerals (MULTIVITAMIN WITH MINERALS) tablet Take 1 tablet by mouth daily.   potassium chloride (KLOR-CON) 10 MEQ tablet Take 1 tablet by mouth daily.   torsemide (DEMADEX) 20 MG tablet Take 1 tablet by mouth every morning.   traMADol (ULTRAM) 50 MG tablet Take 1 tablet (50 mg total) by mouth every 6 (six) hours as needed.     Allergies:   Sulfonamide derivatives, Latex, and Sulfasalazine   Social History   Tobacco Use   Smoking status: Never   Smokeless tobacco: Never  Vaping Use   Vaping Use: Never used  Substance Use Topics   Alcohol use: Yes    Comment: MIX OCCASIONALLY   Drug use: No     Family Hx: The patient's family history includes Heart disease in her father; Hypertension in her father, mother, sister, and sister; Ovarian cancer in her mother.  ROS:   Please see the history of present illness.    General:no colds or fevers, no weight changes Skin:no rashes or ulcers HEENT:no blurred vision, no  congestion CV:see HPI PUL:see HPI GI:no diarrhea constipation or melena, no indigestion GU:no hematuria, no dysuria MS:no joint pain, no claudication Neuro:no syncope, no lightheadedness, + room spinning  Endo:no diabetes, no thyroid disease  All other systems reviewed and are negative.   Prior CV studies:   The following studies were reviewed today:  Echo 11/21/17  - Left ventricle: The cavity size was normal. Wall thickness was    increased in a pattern of mild LVH. Systolic function was    vigorous. The estimated ejection fraction was in the range of 65%    to 70%. Wall motion was normal; there were no regional wall    motion abnormalities. The study is not technically sufficient to     allow evaluation of LV diastolic function.  - Aortic valve: There was trivial regurgitation.  - Mitral valve: There was mild regurgitation.  - Pulmonary arteries: Systolic pressure was moderately increased.    PA peak pressure: 53 mm Hg (S).   Impressions:   - Definity used; vigorous LV systolic function; mild LVH; trace AI;    mild MR; trace TR with moderate pulmonary hypertension.   Labs/Other Tests and Data Reviewed:    EKG: SR first degree V pacing   Recent Labs: No results found for requested labs within last 8760 hours.   Recent Lipid Panel No results found for: CHOL, TRIG, HDL, CHOLHDL, LDLCALC, LDLDIRECT  Wt Readings from Last 3 Encounters:  09/22/20 71.2 kg  12/21/19 71.7 kg  09/29/19 71.9 kg     Risk Assessment/Calculations:      Objective:    Vital Signs:  BP 126/64   Pulse 62   Ht 5\' 4"  (1.626 m)   Wt 71.2 kg   SpO2 98%   BMI 26.95 kg/m   Affect appropriate Healthy:  appears stated age 42: normal Neck supple with no adenopathy JVP normal no bruits no thyromegaly Lungs clear with no wheezing and good diaphragmatic motion Heart:  S1/S2 no murmur, no rub, gallop or click PMI normal pacer under left clavicle  Abdomen: benighn, BS positve, no tenderness, no AAA no bruit.  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 F/u Dr Lovena Le PaceArt review normal most recent 09/08/20  COVID-19 Education: The signs and symptoms of COVID-19 were discussed with the patient and how to seek care for testing (follow up with PCP or arrange E-visit).  The importance of social distancing was discussed today.   Medication Adjustments/Labs and Tests Ordered: Current  medicines are reviewed at length with the patient today.  Concerns regarding medicines are outlined above.   Tests Ordered: No orders of the defined types were placed in this encounter.   Medication Changes: No orders of the defined types were placed in this encounter.   Follow Up:  F/U with Dr Lovena Le Does not need general cardiology f/u   Signed, [name], MD  09/22/2020 8:50 AM    [place]

## 2020-09-22 ENCOUNTER — Ambulatory Visit: Payer: Medicare PPO | Admitting: Cardiovascular Disease

## 2020-09-22 ENCOUNTER — Encounter: Payer: Self-pay | Admitting: Cardiovascular Disease

## 2020-09-22 ENCOUNTER — Other Ambulatory Visit: Payer: Self-pay

## 2020-09-22 VITALS — BP 126/64 | HR 62 | Ht 64.0 in | Wt 157.0 lb

## 2020-09-22 DIAGNOSIS — E782 Mixed hyperlipidemia: Secondary | ICD-10-CM | POA: Diagnosis not present

## 2020-09-22 DIAGNOSIS — R0600 Dyspnea, unspecified: Secondary | ICD-10-CM

## 2020-09-22 DIAGNOSIS — Z950 Presence of cardiac pacemaker: Secondary | ICD-10-CM | POA: Diagnosis not present

## 2020-09-22 DIAGNOSIS — R0609 Other forms of dyspnea: Secondary | ICD-10-CM

## 2020-09-22 NOTE — Patient Instructions (Signed)
Medication Instructions:  *If you need a refill on your cardiac medications before your next appointment, please call your pharmacy*   Lab Work: If you have labs (blood work) drawn today and your tests are completely normal, you will receive your results only by: [place] (if you have MyChart) OR A paper copy in the mail If you have any lab test that is abnormal or we need to change your treatment, we will call you to review the results.  Follow-Up: At [facility], you and your health needs are our priority.  As part of our continuing mission to provide you with exceptional heart care, we have created designated Provider Care Teams.  These Care Teams include your primary Cardiologist (physician) and Advanced Practice Providers (APPs -  Physician Assistants and Nurse Practitioners) who all work together to provide you with the care you need, when you need it.  We recommend signing up for the patient portal called "MyChart".  Sign up information is provided on this After Visit Summary.  MyChart is used to connect with patients for Virtual Visits (Telemedicine).  Patients are able to view lab/test results, encounter notes, upcoming appointments, etc.  Non-urgent messages can be sent to your provider as well.   To learn more about what you can do with MyChart, go to [email or web address].    Your next appointment:   As needed

## 2020-09-29 NOTE — Progress Notes (Signed)
Remote pacemaker transmission.   

## 2020-10-11 ENCOUNTER — Encounter: Payer: Self-pay | Admitting: Internal Medicine

## 2020-10-11 ENCOUNTER — Other Ambulatory Visit: Payer: Self-pay

## 2020-10-11 ENCOUNTER — Ambulatory Visit: Payer: Medicare PPO | Admitting: Internal Medicine

## 2020-10-11 VITALS — BP 112/62 | HR 60 | Ht 64.0 in | Wt 157.0 lb

## 2020-10-11 DIAGNOSIS — I4430 Unspecified atrioventricular block: Secondary | ICD-10-CM

## 2020-10-11 DIAGNOSIS — R55 Syncope and collapse: Secondary | ICD-10-CM | POA: Diagnosis not present

## 2020-10-11 DIAGNOSIS — Z950 Presence of cardiac pacemaker: Secondary | ICD-10-CM | POA: Diagnosis not present

## 2020-10-11 DIAGNOSIS — I10 Essential (primary) hypertension: Secondary | ICD-10-CM | POA: Diagnosis not present

## 2020-10-11 MED ORDER — AMLODIPINE BESYLATE 5 MG PO TABS: 5.0000 mg | ORAL_TABLET | Freq: Every day | ORAL | 3 refills | Status: DC

## 2020-10-11 NOTE — Progress Notes (Signed)
HPI Ms. Lauren Holloway returns today for followup. She is a pleasant 87 yo woman with a h/o symptomatic 2:1 AV block, who is s/p PPM insertion. She has HTN. She denies chest pain or sob. No edema. No syncope. She feels well. She is a little anxious about having to have a PPM. She wonders if she will ever be able to remove her PPM.  Allergies  Allergen Reactions   Sulfonamide Derivatives     HIVES    Latex Rash   Sulfasalazine     Other reaction(s): Other (See Comments) HIVES     Current Outpatient Medications  Medication Sig Dispense Refill   acetaminophen (ACETAMINOPHEN EXTRA STRENGTH) 500 MG tablet Take 500 mg by mouth every 6 (six) hours as needed.     Alirocumab (PRALUENT) 150 MG/ML SOAJ Inject 150 mg into the skin every 14 (fourteen) days.     ALPRAZolam (XANAX) 0.5 MG tablet Take 0.25 mg by mouth 2 (two) times daily as needed for anxiety or sleep.  3   amLODipine (NORVASC) 10 MG tablet Pt takes 1/2 tablet on Sundays & Thursdays and 1 tablet M, Tue, Wed, Friday, & Saturday     escitalopram (LEXAPRO) 5 MG tablet Take 5 mg by mouth daily.      Fish Oil-Cholecalciferol (FISH OIL + D3 PO) Take 1 tablet by mouth daily.      lisinopril-hydrochlorothiazide (PRINZIDE,ZESTORETIC) 20-25 MG per tablet Take 1 tablet by mouth daily.     metoprolol tartrate (LOPRESSOR) 25 MG tablet Take 25 mg by mouth 2 (two) times daily.      Multiple Vitamins-Minerals (MULTIVITAMIN WITH MINERALS) tablet Take 1 tablet by mouth daily.     potassium chloride (KLOR-CON) 10 MEQ tablet Take 1 tablet by mouth daily.     torsemide (DEMADEX) 20 MG tablet Take 1 tablet by mouth every morning.     traMADol (ULTRAM) 50 MG tablet Take 1 tablet (50 mg total) by mouth every 6 (six) hours as needed. 30 tablet 1   No current facility-administered medications for this visit.     Past Medical History:  Diagnosis Date   Cataract    Hyperlipidemia    Hypertension     ROS:   All systems reviewed and negative except  as noted in the HPI.   Past Surgical History:  Procedure Laterality Date   ABDOMINAL HYSTERECTOMY     APPENDECTOMY     BILATERAL OOPHORECTOMY     CATARACT EXTRACTION, BILATERAL Bilateral 1996   PACEMAKER IMPLANT N/A 11/24/2017   Procedure: PACEMAKER IMPLANT;  Surgeon: Evans Lance, MD;  Location: [facility];  Service: Cardiovascular;  Laterality: N/A;     Family History  Problem Relation Age of Onset   Ovarian cancer Mother    Hypertension Mother    Hypertension Father    Heart disease Father    Hypertension Sister    Hypertension Sister      Social History   Socioeconomic History   Marital status: Widowed    Spouse name: Not on file   Number of children: 1   Years of education: Not on file   Highest education level: Not on file  Occupational History   Not on file  Tobacco Use   Smoking status: Never   Smokeless tobacco: Never  Vaping Use   Vaping Use: Never used  Substance and Sexual Activity   Alcohol use: Yes    Comment: MIX OCCASIONALLY   Drug use: No   Sexual  activity: Not on file  Other Topics Concern   Not on file  Social History Narrative   Not on file   Social Determinants of Health   Financial Resource Strain: Not on file  Food Insecurity: Not on file  Transportation Needs: Not on file  Physical Activity: Not on file  Stress: Not on file  Social Connections: Not on file  Intimate Partner Violence: Not on file     BP 112/62   Pulse 60   Ht '5\' 4"'$  (1.626 m)   Wt 157 lb (71.2 kg)   SpO2 95%   BMI 26.95 kg/m   Physical Exam:  Well appearing NAD HEENT: Unremarkable Neck:  No JVD, no thyromegally Lymphatics:  No adenopathy Back:  No CVA tenderness Lungs:  Clear HEART:  Regular rate rhythm, no murmurs, no rubs, no clicks Abd:  soft, positive bowel sounds, no organomegally, no rebound, no guarding Ext:  2 plus pulses, no edema, no cyanosis, no clubbing Skin:  No rashes no nodules Neuro:  CN II through XII intact, motor  grossly intact  DEVICE  Normal device function.  See PaceArt for details.   Assess/Plan:  Heart block - she is doing well s/p PPM insertion.  PPM - her Medtronic device is working normally.  HTN - her bp has been low and I asked her to cut back her amlodipine to 5 mg daily. Dyslipidemia - she will continue her statin 5 mg of crestor daily.  Carleene Overlie Teva Bronkema,MD

## 2020-10-11 NOTE — Patient Instructions (Addendum)
Medication Instructions:  Your physician has recommended you make the following change in your medication:    REDUCE your amlodipine-  Take 5 mg by mouth daily   Labwork: None ordered.  Testing/Procedures: None ordered.  Follow-Up: Your physician wants you to follow-up in: one year with Cristopher Peru, MD or one of the following Advanced Practice Providers on your designated Care Team:   Tommye Standard, [place] Legrand Como "Jonni Sanger" Chalmers Cater, [place]  Remote monitoring is used to monitor your Pacemaker from home. This monitoring reduces the number of office visits required to check your device to one time per year. It allows [place] to keep an eye on the functioning of your device to ensure it is working properly. You are scheduled for a device check from home on 12/08/2020. You may send your transmission at any time that day. If you have a wireless device, the transmission will be sent automatically. After your physician reviews your transmission, you will receive a postcard with your next transmission date.  Any Other Special Instructions Will Be Listed Below (If Applicable).  If you need a refill on your cardiac medications before your next appointment, please call your pharmacy.

## 2020-12-05 DIAGNOSIS — M1990 Unspecified osteoarthritis, unspecified site: Secondary | ICD-10-CM | POA: Diagnosis not present

## 2020-12-05 DIAGNOSIS — Z882 Allergy status to sulfonamides status: Secondary | ICD-10-CM | POA: Diagnosis not present

## 2020-12-05 DIAGNOSIS — Z950 Presence of cardiac pacemaker: Secondary | ICD-10-CM | POA: Diagnosis not present

## 2020-12-05 DIAGNOSIS — I10 Essential (primary) hypertension: Secondary | ICD-10-CM | POA: Diagnosis not present

## 2020-12-05 DIAGNOSIS — R32 Unspecified urinary incontinence: Secondary | ICD-10-CM | POA: Diagnosis not present

## 2020-12-05 DIAGNOSIS — E785 Hyperlipidemia, unspecified: Secondary | ICD-10-CM | POA: Diagnosis not present

## 2020-12-05 DIAGNOSIS — Z91040 Latex allergy status: Secondary | ICD-10-CM | POA: Diagnosis not present

## 2020-12-05 DIAGNOSIS — F329 Major depressive disorder, single episode, unspecified: Secondary | ICD-10-CM | POA: Diagnosis not present

## 2020-12-08 ENCOUNTER — Telehealth: Payer: Self-pay

## 2020-12-08 ENCOUNTER — Ambulatory Visit (INDEPENDENT_AMBULATORY_CARE_PROVIDER_SITE_OTHER): Payer: Medicare PPO

## 2020-12-08 DIAGNOSIS — I4430 Unspecified atrioventricular block: Secondary | ICD-10-CM | POA: Diagnosis not present

## 2020-12-08 NOTE — Telephone Encounter (Signed)
The patient called to get help with her monitor. Transmission received. I also let her know if she is going to be gone for less than 7 days then she can leave the monitor at home. The patient verbalized understanding.

## 2020-12-11 LAB — CUP PACEART REMOTE DEVICE CHECK
Battery Remaining Longevity: 71 mo
Battery Voltage: 2.97 V
Brady Statistic AP VP Percent: 65.44 %
Brady Statistic AP VS Percent: 2.52 %
Brady Statistic AS VP Percent: 30.33 %
Brady Statistic AS VS Percent: 1.71 %
Brady Statistic RA Percent Paced: 68.13 %
Brady Statistic RV Percent Paced: 95.77 %
Date Time Interrogation Session: 20221007121538
Implantable Lead Implant Date: 20190923
Implantable Lead Implant Date: 20190923
Implantable Lead Location: 753859
Implantable Lead Location: 753860
Implantable Lead Model: 3830
Implantable Lead Model: 5076
Implantable Pulse Generator Implant Date: 20190923
Lead Channel Impedance Value: 323 Ohm
Lead Channel Impedance Value: 342 Ohm
Lead Channel Impedance Value: 399 Ohm
Lead Channel Impedance Value: 399 Ohm
Lead Channel Pacing Threshold Amplitude: 0.5 V
Lead Channel Pacing Threshold Amplitude: 1.875 V
Lead Channel Pacing Threshold Pulse Width: 0.4 ms
Lead Channel Pacing Threshold Pulse Width: 0.4 ms
Lead Channel Sensing Intrinsic Amplitude: 1 mV
Lead Channel Sensing Intrinsic Amplitude: 1 mV
Lead Channel Sensing Intrinsic Amplitude: 9.5 mV
Lead Channel Sensing Intrinsic Amplitude: 9.5 mV
Lead Channel Setting Pacing Amplitude: 1.5 V
Lead Channel Setting Pacing Amplitude: 3 V
Lead Channel Setting Pacing Pulse Width: 1 ms
Lead Channel Setting Sensing Sensitivity: 1.2 mV

## 2020-12-18 NOTE — Progress Notes (Signed)
Remote pacemaker transmission.   

## 2021-01-09 DIAGNOSIS — E785 Hyperlipidemia, unspecified: Secondary | ICD-10-CM | POA: Diagnosis not present

## 2021-01-09 DIAGNOSIS — R7301 Impaired fasting glucose: Secondary | ICD-10-CM | POA: Diagnosis not present

## 2021-01-09 DIAGNOSIS — I10 Essential (primary) hypertension: Secondary | ICD-10-CM | POA: Diagnosis not present

## 2021-01-16 DIAGNOSIS — R82998 Other abnormal findings in urine: Secondary | ICD-10-CM | POA: Diagnosis not present

## 2021-01-16 DIAGNOSIS — H6123 Impacted cerumen, bilateral: Secondary | ICD-10-CM | POA: Diagnosis not present

## 2021-01-16 DIAGNOSIS — Z23 Encounter for immunization: Secondary | ICD-10-CM | POA: Diagnosis not present

## 2021-01-16 DIAGNOSIS — E785 Hyperlipidemia, unspecified: Secondary | ICD-10-CM | POA: Diagnosis not present

## 2021-01-16 DIAGNOSIS — Z950 Presence of cardiac pacemaker: Secondary | ICD-10-CM | POA: Diagnosis not present

## 2021-01-16 DIAGNOSIS — Z0000 Encounter for general adult medical examination without abnormal findings: Secondary | ICD-10-CM | POA: Diagnosis not present

## 2021-01-16 DIAGNOSIS — I10 Essential (primary) hypertension: Secondary | ICD-10-CM | POA: Diagnosis not present

## 2021-01-16 DIAGNOSIS — M8580 Other specified disorders of bone density and structure, unspecified site: Secondary | ICD-10-CM | POA: Diagnosis not present

## 2021-01-16 DIAGNOSIS — H8110 Benign paroxysmal vertigo, unspecified ear: Secondary | ICD-10-CM | POA: Diagnosis not present

## 2021-01-16 DIAGNOSIS — Z1331 Encounter for screening for depression: Secondary | ICD-10-CM | POA: Diagnosis not present

## 2021-01-16 DIAGNOSIS — Z1389 Encounter for screening for other disorder: Secondary | ICD-10-CM | POA: Diagnosis not present

## 2021-01-16 DIAGNOSIS — R7301 Impaired fasting glucose: Secondary | ICD-10-CM | POA: Diagnosis not present

## 2021-02-06 DIAGNOSIS — E8940 Asymptomatic postprocedural ovarian failure: Secondary | ICD-10-CM | POA: Diagnosis not present

## 2021-02-18 ENCOUNTER — Ambulatory Visit (HOSPITAL_COMMUNITY)
Admission: EM | Admit: 2021-02-18 | Discharge: 2021-02-18 | Disposition: A | Discharge status: Home / Self Care | Payer: Medicare PPO | Attending: Emergency Medicine | Admitting: Emergency Medicine

## 2021-02-18 ENCOUNTER — Encounter (HOSPITAL_COMMUNITY): Payer: Self-pay

## 2021-02-18 ENCOUNTER — Other Ambulatory Visit: Payer: Self-pay

## 2021-02-18 DIAGNOSIS — Z20828 Contact with and (suspected) exposure to other viral communicable diseases: Secondary | ICD-10-CM

## 2021-02-18 DIAGNOSIS — J069 Acute upper respiratory infection, unspecified: Secondary | ICD-10-CM | POA: Insufficient documentation

## 2021-02-18 DIAGNOSIS — M546 Pain in thoracic spine: Secondary | ICD-10-CM | POA: Diagnosis not present

## 2021-02-18 DIAGNOSIS — Z20822 Contact with and (suspected) exposure to covid-19: Secondary | ICD-10-CM | POA: Diagnosis not present

## 2021-02-18 LAB — POC INFLUENZA A AND B ANTIGEN (URGENT CARE ONLY)
INFLUENZA A ANTIGEN, POC: NEGATIVE
INFLUENZA B ANTIGEN, POC: NEGATIVE

## 2021-02-18 NOTE — ED Provider Notes (Signed)
[facility]    CSN: [id number] Arrival date & time: 02/18/21  1040      History   Chief Complaint Chief Complaint  Patient presents with   Sore Throat   Flank Pain    HPI Lauren Holloway is a 87 y.o. female.   HPI Lauren Holloway is a 87 y.o. female presenting to UC with c/o sore throat, sneezing and coughing since yesterday, associated Right side pain when coughing. Her daughter tested positive for COVID 5 days ago.  Pt called her PCP who recommended she get a COVID test.  She reports having chills at night.  Denies chest pain, SOB, n/v/d. No urinary symptoms or rashes.   She has received 5 COVID vaccines (including 3 boosters) and flu vaccine.   Past Medical History:  Diagnosis Date   Cataract    Hyperlipidemia    Hypertension     Patient Active Problem List   Diagnosis Date Noted   Syncope 10/11/2020   Pacemaker 10/11/2020   AV heart block 11/20/2017   Pain in right hip 10/17/2017   Chronic left shoulder pain 03/17/2017   Grade 2 ankle sprain, unspecified laterality, subsequent encounter 05/28/2016   Dyspnea 10/25/2013   Essential hypertension 05/18/2010   ABDOMINAL PAIN RIGHT LOWER QUADRANT 05/18/2010   PERSONAL HISTORY OF COLONIC POLYPS 05/18/2010    Past Surgical History:  Procedure Laterality Date   ABDOMINAL HYSTERECTOMY     APPENDECTOMY     BILATERAL OOPHORECTOMY     CATARACT EXTRACTION, BILATERAL Bilateral 1996   PACEMAKER IMPLANT N/A 11/24/2017   Procedure: PACEMAKER IMPLANT;  Surgeon: Evans Lance, MD;  Location: [facility];  Service: Cardiovascular;  Laterality: N/A;    OB History   No obstetric history on file.      Home Medications    Prior to Admission medications   Medication Sig Start Date End Date Taking? Authorizing Provider  acetaminophen (ACETAMINOPHEN EXTRA STRENGTH) 500 MG tablet Take 500 mg by mouth every 6 (six) hours as needed.    [provider]  Alirocumab (PRALUENT) 150 MG/ML SOAJ Inject  150 mg into the skin every 14 (fourteen) days.    [provider]  ALPRAZolam Duanne Moron) 0.5 MG tablet Take 0.25 mg by mouth 2 (two) times daily as needed for anxiety or sleep. 06/17/16   [provider]  amLODipine (NORVASC) 5 MG tablet Take 1 tablet (5 mg total) by mouth daily. 10/11/20 01/09/21  Evans Lance, MD  escitalopram (LEXAPRO) 5 MG tablet Take 5 mg by mouth daily.     [provider]  Fish Oil-Cholecalciferol (FISH OIL + D3 PO) Take 1 tablet by mouth daily.     [provider]  lisinopril-hydrochlorothiazide (PRINZIDE,ZESTORETIC) 20-25 MG per tablet Take 1 tablet by mouth daily. 05/03/14   [provider]  metoprolol tartrate (LOPRESSOR) 25 MG tablet Take 25 mg by mouth 2 (two) times daily.  03/18/13   [provider]  Multiple Vitamins-Minerals (MULTIVITAMIN WITH MINERALS) tablet Take 1 tablet by mouth daily.    [provider]  potassium chloride (KLOR-CON) 10 MEQ tablet Take 1 tablet by mouth daily. 09/16/19   [provider]  torsemide (DEMADEX) 20 MG tablet Take 1 tablet by mouth every morning. 11/17/18   [provider]  traMADol (ULTRAM) 50 MG tablet Take 1 tablet (50 mg total) by mouth every 6 (six) hours as needed. 05/01/18   Aundra Dubin, PA-C    Family History Family History  Problem Relation Age of Onset   Ovarian cancer Mother    Hypertension Mother    Hypertension Father    Heart disease Father    Hypertension Sister    Hypertension Sister     Social History Social History   Tobacco Use   Smoking status: Never   Smokeless tobacco: Never  Vaping Use   Vaping Use: Never used  Substance Use Topics   Alcohol use: Yes    Comment: MIX OCCASIONALLY   Drug use: No     Allergies   Sulfonamide derivatives, Latex, and Sulfasalazine   Review of Systems Review of Systems  Constitutional:  Positive for chills. Negative for fever.  HENT:  Positive for congestion, rhinorrhea and sore  throat. Negative for ear pain, trouble swallowing and voice change.   Respiratory:  Positive for cough. Negative for shortness of breath.   Cardiovascular:  Negative for chest pain and palpitations.  Gastrointestinal:  Negative for abdominal pain, diarrhea, nausea and vomiting.  Genitourinary:  Positive for flank pain (right side). Negative for dysuria, frequency, hematuria and urgency.  Musculoskeletal:  Positive for back pain and myalgias. Negative for arthralgias.  Skin:  Negative for rash.  All other systems reviewed and are negative.   Physical Exam Triage Vital Signs ED Triage Vitals  Enc Vitals Group     BP 02/18/21 1216 (!) 128/58     Pulse Rate 02/18/21 1216 68     Resp 02/18/21 1216 17     Temp 02/18/21 1216 99 F (37.2 C)     Temp Source 02/18/21 1216 Oral     SpO2 02/18/21 1216 97 %     Weight --      Height --      Head Circumference --      Peak Flow --      Pain Score 02/18/21 1215 0     Pain Loc --      Pain Edu? --      Excl. in [place]? --    No data found.  Updated Vital Signs BP (!) 128/58 (BP Location: Right Arm)    Pulse 68    Temp 99 F (37.2 C) (Oral)    Resp 17    SpO2 97%   Visual Acuity Right Eye Distance:   Left Eye Distance:   Bilateral Distance:    Right Eye Near:   Left Eye Near:    Bilateral Near:     Physical Exam Vitals and nursing note reviewed.  Constitutional:      General: She is not in acute distress.    Appearance: She is well-developed. She is not ill-appearing, toxic-appearing or diaphoretic.  HENT:     Head: Normocephalic and atraumatic.     Right Ear: Tympanic membrane and ear canal normal.     Left Ear: Tympanic membrane and ear canal normal.     Nose: Congestion present.     Right Sinus: No maxillary sinus tenderness or frontal sinus tenderness.     Left Sinus: No maxillary sinus tenderness or frontal sinus tenderness.     Mouth/Throat:     Lips: Pink.     Mouth: Mucous membranes are moist.     Pharynx: Oropharynx is  clear. Uvula midline. No pharyngeal swelling, oropharyngeal exudate, posterior oropharyngeal erythema or uvula swelling.  Cardiovascular:     Rate and Rhythm: Normal rate and regular rhythm.  Pulmonary:     Effort: Pulmonary effort is normal. No respiratory distress.     Breath sounds:  Normal breath sounds. No stridor. No wheezing, rhonchi or rales.  Musculoskeletal:        General: Normal range of motion.     Cervical back: Normal range of motion and neck supple.       Back:     Comments: No spinal tenderness. Full ROM upper and lower extremities. Tenderness to right mid thoracic muscles.    Lymphadenopathy:     Cervical: No cervical adenopathy.  Skin:    General: Skin is warm and dry.  Neurological:     Mental Status: She is alert and oriented to person, place, and time.  Psychiatric:        Behavior: Behavior normal.     UC Treatments / Results  Labs (all labs ordered are listed, but only abnormal results are displayed) Labs Reviewed  SARS CORONAVIRUS 2 (TAT 6-24 HRS)  POC INFLUENZA A AND B ANTIGEN (URGENT CARE ONLY)    EKG   Radiology No results found.  Procedures Procedures (including critical care time)  Medications Ordered in UC Medications - No data to display  Initial Impression / Assessment and Plan / UC Course  I have reviewed the triage vital signs and the nursing notes.  Pertinent labs & imaging results that were available during my care of the patient were reviewed by me and considered in my medical decision making (see chart for details).    Pt appears well, HR and O2 WNL Right mid back pain likely due to muscle strain from cough. No evidence of shingles. No symptoms of UTI at this time. No evidence of bacterial infection at this time.  Rapid flu: NEGATIVE COVID test PENDING Discussed home care and f/u with PCP AVS provided  Final Clinical Impressions(s) / UC Diagnoses   Final diagnoses:  Viral upper respiratory illness  Close exposure to  COVID-19 virus  Acute right-sided thoracic back pain     Discharge Instructions       You will be notified within about 2-3 days if your COVID test is POSITIVE.  You will not receive a call if negative but you can view results in your free [facility] MyChart account.  If you MUST go out, always wear a facemask and keep your distance from others. Isolate for at least 5 days from symptom onset and 24 hours after fever resolves without use of a fever reducing medication to help prevent worsening community spread of the virus.  Talk to your doctor about possible referral to Covid clinics.    Call 911 or go to the closest hospital if you develop worsening trouble breathing, dizziness/passing out, unable to keep down fluids, or other new concerning symptoms develop.       ED Prescriptions   None    PDMP not reviewed this encounter.   Noe Gens, PA-C 02/18/21 1351

## 2021-02-18 NOTE — ED Triage Notes (Signed)
Pt presents with c/o sore throat, sneezing and coughing x 2 days.  Pt states she has not taken any medicine and states nothing makes it better,   States she has had chills.

## 2021-02-18 NOTE — Discharge Instructions (Signed)
°  You will be notified within about 2-3 days if your COVID test is POSITIVE.  You will not receive a call if negative but you can view results in your free [facility] MyChart account.  If you MUST go out, always wear a facemask and keep your distance from others. Isolate for at least 5 days from symptom onset and 24 hours after fever resolves without use of a fever reducing medication to help prevent worsening community spread of the virus.  Talk to your doctor about possible referral to Covid clinics.    Call 911 or go to the closest hospital if you develop worsening trouble breathing, dizziness/passing out, unable to keep down fluids, or other new concerning symptoms develop.

## 2021-02-19 LAB — SARS CORONAVIRUS 2 (TAT 6-24 HRS): SARS Coronavirus 2: POSITIVE — AB

## 2021-03-09 ENCOUNTER — Ambulatory Visit (INDEPENDENT_AMBULATORY_CARE_PROVIDER_SITE_OTHER): Payer: Medicare PPO

## 2021-03-09 DIAGNOSIS — I4430 Unspecified atrioventricular block: Secondary | ICD-10-CM | POA: Diagnosis not present

## 2021-03-09 LAB — CUP PACEART REMOTE DEVICE CHECK
Battery Remaining Longevity: 69 mo
Battery Voltage: 2.97 V
Brady Statistic AP VP Percent: 58.31 %
Brady Statistic AP VS Percent: 9.14 %
Brady Statistic AS VP Percent: 21.68 %
Brady Statistic AS VS Percent: 10.87 %
Brady Statistic RA Percent Paced: 67.49 %
Brady Statistic RV Percent Paced: 79.99 %
Date Time Interrogation Session: 20230105211917
Implantable Lead Implant Date: 20190923
Implantable Lead Implant Date: 20190923
Implantable Lead Location: 753859
Implantable Lead Location: 753860
Implantable Lead Model: 3830
Implantable Lead Model: 5076
Implantable Pulse Generator Implant Date: 20190923
Lead Channel Impedance Value: 304 Ohm
Lead Channel Impedance Value: 342 Ohm
Lead Channel Impedance Value: 380 Ohm
Lead Channel Impedance Value: 399 Ohm
Lead Channel Pacing Threshold Amplitude: 0.5 V
Lead Channel Pacing Threshold Amplitude: 2 V
Lead Channel Pacing Threshold Pulse Width: 0.4 ms
Lead Channel Pacing Threshold Pulse Width: 0.4 ms
Lead Channel Sensing Intrinsic Amplitude: 0.75 mV
Lead Channel Sensing Intrinsic Amplitude: 0.75 mV
Lead Channel Sensing Intrinsic Amplitude: 8.125 mV
Lead Channel Sensing Intrinsic Amplitude: 8.125 mV
Lead Channel Setting Pacing Amplitude: 1.5 V
Lead Channel Setting Pacing Amplitude: 3 V
Lead Channel Setting Pacing Pulse Width: 1 ms
Lead Channel Setting Sensing Sensitivity: 1.2 mV

## 2021-03-19 NOTE — Progress Notes (Signed)
Remote pacemaker transmission.   

## 2021-05-09 DIAGNOSIS — Z8249 Family history of ischemic heart disease and other diseases of the circulatory system: Secondary | ICD-10-CM | POA: Diagnosis not present

## 2021-05-09 DIAGNOSIS — Z91040 Latex allergy status: Secondary | ICD-10-CM | POA: Diagnosis not present

## 2021-05-09 DIAGNOSIS — R609 Edema, unspecified: Secondary | ICD-10-CM | POA: Diagnosis not present

## 2021-05-09 DIAGNOSIS — I10 Essential (primary) hypertension: Secondary | ICD-10-CM | POA: Diagnosis not present

## 2021-05-09 DIAGNOSIS — E785 Hyperlipidemia, unspecified: Secondary | ICD-10-CM | POA: Diagnosis not present

## 2021-05-09 DIAGNOSIS — F325 Major depressive disorder, single episode, in full remission: Secondary | ICD-10-CM | POA: Diagnosis not present

## 2021-05-09 DIAGNOSIS — Z882 Allergy status to sulfonamides status: Secondary | ICD-10-CM | POA: Diagnosis not present

## 2021-06-08 ENCOUNTER — Ambulatory Visit (INDEPENDENT_AMBULATORY_CARE_PROVIDER_SITE_OTHER): Payer: Medicare PPO

## 2021-06-08 DIAGNOSIS — I441 Atrioventricular block, second degree: Secondary | ICD-10-CM

## 2021-06-09 LAB — CUP PACEART REMOTE DEVICE CHECK
Battery Remaining Longevity: 61 mo
Battery Voltage: 2.96 V
Brady Statistic AP VP Percent: 68.78 %
Brady Statistic AP VS Percent: 2.27 %
Brady Statistic AS VP Percent: 27.68 %
Brady Statistic AS VS Percent: 1.27 %
Brady Statistic RA Percent Paced: 71.07 %
Brady Statistic RV Percent Paced: 96.46 %
Date Time Interrogation Session: 20230406224103
Implantable Lead Implant Date: 20190923
Implantable Lead Implant Date: 20190923
Implantable Lead Location: 753859
Implantable Lead Location: 753860
Implantable Lead Model: 3830
Implantable Lead Model: 5076
Implantable Pulse Generator Implant Date: 20190923
Lead Channel Impedance Value: 342 Ohm
Lead Channel Impedance Value: 342 Ohm
Lead Channel Impedance Value: 380 Ohm
Lead Channel Impedance Value: 418 Ohm
Lead Channel Pacing Threshold Amplitude: 0.5 V
Lead Channel Pacing Threshold Amplitude: 1.75 V
Lead Channel Pacing Threshold Pulse Width: 0.4 ms
Lead Channel Pacing Threshold Pulse Width: 0.4 ms
Lead Channel Sensing Intrinsic Amplitude: 1.75 mV
Lead Channel Sensing Intrinsic Amplitude: 1.75 mV
Lead Channel Sensing Intrinsic Amplitude: 9.75 mV
Lead Channel Sensing Intrinsic Amplitude: 9.75 mV
Lead Channel Setting Pacing Amplitude: 1.5 V
Lead Channel Setting Pacing Amplitude: 3 V
Lead Channel Setting Pacing Pulse Width: 1 ms
Lead Channel Setting Sensing Sensitivity: 1.2 mV

## 2021-06-18 DIAGNOSIS — L509 Urticaria, unspecified: Secondary | ICD-10-CM | POA: Diagnosis not present

## 2021-06-19 DIAGNOSIS — Z961 Presence of intraocular lens: Secondary | ICD-10-CM | POA: Diagnosis not present

## 2021-06-19 DIAGNOSIS — H52203 Unspecified astigmatism, bilateral: Secondary | ICD-10-CM | POA: Diagnosis not present

## 2021-06-19 DIAGNOSIS — H524 Presbyopia: Secondary | ICD-10-CM | POA: Diagnosis not present

## 2021-06-26 NOTE — Progress Notes (Signed)
Remote pacemaker transmission.   

## 2021-07-19 DIAGNOSIS — M8580 Other specified disorders of bone density and structure, unspecified site: Secondary | ICD-10-CM | POA: Diagnosis not present

## 2021-07-19 DIAGNOSIS — E876 Hypokalemia: Secondary | ICD-10-CM | POA: Diagnosis not present

## 2021-07-19 DIAGNOSIS — I272 Pulmonary hypertension, unspecified: Secondary | ICD-10-CM | POA: Diagnosis not present

## 2021-07-19 DIAGNOSIS — I10 Essential (primary) hypertension: Secondary | ICD-10-CM | POA: Diagnosis not present

## 2021-07-19 DIAGNOSIS — E785 Hyperlipidemia, unspecified: Secondary | ICD-10-CM | POA: Diagnosis not present

## 2021-07-19 DIAGNOSIS — R600 Localized edema: Secondary | ICD-10-CM | POA: Diagnosis not present

## 2021-07-19 DIAGNOSIS — R7301 Impaired fasting glucose: Secondary | ICD-10-CM | POA: Diagnosis not present

## 2021-07-19 DIAGNOSIS — Z950 Presence of cardiac pacemaker: Secondary | ICD-10-CM | POA: Diagnosis not present

## 2021-08-27 DIAGNOSIS — H6123 Impacted cerumen, bilateral: Secondary | ICD-10-CM | POA: Diagnosis not present

## 2021-08-27 DIAGNOSIS — H9202 Otalgia, left ear: Secondary | ICD-10-CM | POA: Diagnosis not present

## 2021-09-07 ENCOUNTER — Ambulatory Visit (INDEPENDENT_AMBULATORY_CARE_PROVIDER_SITE_OTHER): Payer: Medicare PPO

## 2021-09-07 DIAGNOSIS — I441 Atrioventricular block, second degree: Secondary | ICD-10-CM | POA: Diagnosis not present

## 2021-09-09 LAB — CUP PACEART REMOTE DEVICE CHECK
Battery Remaining Longevity: 54 mo
Battery Voltage: 2.95 V
Brady Statistic AP VP Percent: 67.9 %
Brady Statistic AP VS Percent: 0.08 %
Brady Statistic AS VP Percent: 31.91 %
Brady Statistic AS VS Percent: 0.11 %
Brady Statistic RA Percent Paced: 68.01 %
Brady Statistic RV Percent Paced: 99.81 %
Date Time Interrogation Session: 20230708090711
Implantable Lead Implant Date: 20190923
Implantable Lead Implant Date: 20190923
Implantable Lead Location: 753859
Implantable Lead Location: 753860
Implantable Lead Model: 3830
Implantable Lead Model: 5076
Implantable Pulse Generator Implant Date: 20190923
Lead Channel Impedance Value: 323 Ohm
Lead Channel Impedance Value: 342 Ohm
Lead Channel Impedance Value: 380 Ohm
Lead Channel Impedance Value: 399 Ohm
Lead Channel Pacing Threshold Amplitude: 0.5 V
Lead Channel Pacing Threshold Amplitude: 1.625 V
Lead Channel Pacing Threshold Pulse Width: 0.4 ms
Lead Channel Pacing Threshold Pulse Width: 0.4 ms
Lead Channel Sensing Intrinsic Amplitude: 1.125 mV
Lead Channel Sensing Intrinsic Amplitude: 1.125 mV
Lead Channel Sensing Intrinsic Amplitude: 10.625 mV
Lead Channel Sensing Intrinsic Amplitude: 10.625 mV
Lead Channel Setting Pacing Amplitude: 1.5 V
Lead Channel Setting Pacing Amplitude: 3 V
Lead Channel Setting Pacing Pulse Width: 1 ms
Lead Channel Setting Sensing Sensitivity: 1.2 mV

## 2021-09-18 DIAGNOSIS — Z1231 Encounter for screening mammogram for malignant neoplasm of breast: Secondary | ICD-10-CM | POA: Diagnosis not present

## 2021-09-24 NOTE — Progress Notes (Signed)
Remote pacemaker transmission.   

## 2021-10-12 DIAGNOSIS — M5136 Other intervertebral disc degeneration, lumbar region: Secondary | ICD-10-CM | POA: Diagnosis not present

## 2021-10-12 DIAGNOSIS — M545 Low back pain, unspecified: Secondary | ICD-10-CM | POA: Diagnosis not present

## 2021-10-12 DIAGNOSIS — E785 Hyperlipidemia, unspecified: Secondary | ICD-10-CM | POA: Diagnosis not present

## 2021-10-12 DIAGNOSIS — I10 Essential (primary) hypertension: Secondary | ICD-10-CM | POA: Diagnosis not present

## 2021-10-12 DIAGNOSIS — I272 Pulmonary hypertension, unspecified: Secondary | ICD-10-CM | POA: Diagnosis not present

## 2021-10-12 DIAGNOSIS — R1030 Lower abdominal pain, unspecified: Secondary | ICD-10-CM | POA: Diagnosis not present

## 2021-10-12 DIAGNOSIS — K5900 Constipation, unspecified: Secondary | ICD-10-CM | POA: Diagnosis not present

## 2021-11-01 DIAGNOSIS — E876 Hypokalemia: Secondary | ICD-10-CM | POA: Diagnosis not present

## 2021-11-01 DIAGNOSIS — I10 Essential (primary) hypertension: Secondary | ICD-10-CM | POA: Diagnosis not present

## 2021-11-13 ENCOUNTER — Ambulatory Visit (INDEPENDENT_AMBULATORY_CARE_PROVIDER_SITE_OTHER): Payer: Medicare PPO

## 2021-11-13 ENCOUNTER — Ambulatory Visit: Payer: Medicare PPO | Admitting: Orthopaedic Surgery

## 2021-11-13 DIAGNOSIS — M25511 Pain in right shoulder: Secondary | ICD-10-CM

## 2021-11-13 MED ORDER — LIDOCAINE HCL 2 % IJ SOLN
2.0000 mL | INTRAMUSCULAR | Status: AC | PRN
  Administered 2021-11-13: 2 mL

## 2021-11-13 MED ORDER — BUPIVACAINE HCL 0.25 % IJ SOLN
2.0000 mL | INTRAMUSCULAR | Status: AC | PRN
  Administered 2021-11-13: 2 mL via INTRA_ARTICULAR

## 2021-11-13 MED ORDER — METHYLPREDNISOLONE ACETATE 40 MG/ML IJ SUSP
40.0000 mg | INTRAMUSCULAR | Status: AC | PRN
  Administered 2021-11-13: 40 mg via INTRA_ARTICULAR

## 2021-11-13 MED ORDER — TRAMADOL HCL 50 MG PO TABS: 50.0000 mg | ORAL_TABLET | Freq: Two times a day (BID) | ORAL | 2 refills | Status: DC | PRN

## 2021-11-13 NOTE — Progress Notes (Signed)
Office Visit Note   Patient: Lauren Holloway           Date of Birth: [birth year]           MRN: [id number] Visit Date: 11/13/2021              Requested by: [name], MD [address] [place],  [place] [address] PCP: [name], MD   Assessment & Plan: Visit Diagnoses:  1. Right shoulder pain, unspecified chronicity     Plan: Impression is right shoulder subacromial bursitis.  Today, we proceeded with subacromial cortisone injection.  She tolerated this well.  She will follow-up with [place] as needed.  Call with concerns or questions.  Follow-Up Instructions: Return if symptoms worsen or fail to improve.   Orders:  Orders Placed This Encounter  Procedures   Large Joint Inj: R subacromial bursa   XR Shoulder Right   Meds ordered this encounter  Medications   traMADol (ULTRAM) 50 MG tablet    Sig: Take 1 tablet (50 mg total) by mouth every 12 (twelve) hours as needed.    Dispense:  30 tablet    Refill:  2      Procedures: Large Joint Inj: R subacromial bursa on 11/13/2021 9:09 AM Indications: pain Details: 22 G needle Medications: 2 mL lidocaine 2 %; 2 mL bupivacaine 0.25 %; 40 mg methylPREDNISolone acetate 40 MG/ML Outcome: tolerated well, no immediate complications Patient was prepped and draped in the usual sterile fashion.       Clinical Data: No additional findings.   Subjective: Chief Complaint  Patient presents with   Right Shoulder - Pain    HPI patient is a pleasant 88 year old female who comes in today with right shoulder pain.  This began about a week ago.  She denies any injury or change in activity.  The pain she has is to the entire shoulder.  She does note slight improvement of symptoms over the past few days.  Symptoms are worse when she is trying to comb her hair or take off her clothes as well as when she is sleeping on her right side.  She has been taking Tylenol and using hip cream and applying a hot towel to the shoulder without  significant relief.  She denies any paresthesias to the right upper extremity.  She was seen by [place] about 4 years ago for the right shoulder pain where it appears that we proceed with subacromial cortisone injection.  She does not remember how much this helped.  Review of Systems as detailed in HPI.  All others reviewed and are negative.   Objective: Vital Signs: There were no vitals taken for this visit.  Physical Exam well-developed well-nourished female no acute distress.  Alert and oriented x3.  Ortho Exam right shoulder exam reveals forward flexion to 100 degrees.  External rotation to 45 degrees.  Internal rotation to L5.  Positive empty can test, positive cross body adduction test and pain to the [facility] joint.  Near full strength throughout.  She is neurovascular tact distally.  Specialty Comments:  No specialty comments available.  Imaging: XR Shoulder Right  Result Date: 11/13/2021 Mild degenerative changes the glenohumeral joint and moderate degenerative changes the [facility] joint    PMFS History: Patient Active Problem List   Diagnosis Date Noted   Syncope 10/11/2020   Pacemaker 10/11/2020   AV heart block 11/20/2017   Pain in right hip 10/17/2017   Chronic left shoulder pain 03/17/2017   Grade 2  ankle sprain, unspecified laterality, subsequent encounter 05/28/2016   Dyspnea 10/25/2013   Essential hypertension 05/18/2010   ABDOMINAL PAIN RIGHT LOWER QUADRANT 05/18/2010   PERSONAL HISTORY OF COLONIC POLYPS 05/18/2010   Past Medical History:  Diagnosis Date   Cataract    Hyperlipidemia    Hypertension     Family History  Problem Relation Age of Onset   Ovarian cancer Mother    Hypertension Mother    Hypertension Father    Heart disease Father    Hypertension Sister    Hypertension Sister     Past Surgical History:  Procedure Laterality Date   ABDOMINAL HYSTERECTOMY     APPENDECTOMY     BILATERAL OOPHORECTOMY     CATARACT EXTRACTION, BILATERAL Bilateral 1996    PACEMAKER IMPLANT N/A 11/24/2017   Procedure: PACEMAKER IMPLANT;  Surgeon: Evans Lance, MD;  Location: [facility];  Service: Cardiovascular;  Laterality: N/A;   Social History   Occupational History   Not on file  Tobacco Use   Smoking status: Never   Smokeless tobacco: Never  Vaping Use   Vaping Use: Never used  Substance and Sexual Activity   Alcohol use: Yes    Comment: MIX OCCASIONALLY   Drug use: No   Sexual activity: Not on file

## 2021-11-29 ENCOUNTER — Telehealth: Payer: Self-pay

## 2021-11-29 NOTE — Telephone Encounter (Signed)
Received the following from [employer] [facility]:  Alert remote reviewed. Normal device function.   The RV lead has had a high threshold for the past 3 days, sent to triage Next remote 12/07/2021.   Patient not due to see Dr. Lovena Le until December.  Needs to be seen sooner for RV lead check due to high threshold.   [place],   Can you get patient scheduled with Dr. Lovena Le (soon if available) or a PA otherwise for next available?   Thanks.

## 2021-12-01 ENCOUNTER — Encounter (HOSPITAL_COMMUNITY): Payer: Self-pay | Admitting: *Deleted

## 2021-12-01 ENCOUNTER — Ambulatory Visit (HOSPITAL_COMMUNITY)
Admission: EM | Admit: 2021-12-01 | Discharge: 2021-12-01 | Disposition: A | Discharge status: Home / Self Care | Payer: Medicare PPO | Attending: Internal Medicine | Admitting: Internal Medicine

## 2021-12-01 ENCOUNTER — Other Ambulatory Visit: Payer: Self-pay

## 2021-12-01 ENCOUNTER — Ambulatory Visit (INDEPENDENT_AMBULATORY_CARE_PROVIDER_SITE_OTHER): Payer: Medicare PPO

## 2021-12-01 DIAGNOSIS — M7989 Other specified soft tissue disorders: Secondary | ICD-10-CM | POA: Diagnosis not present

## 2021-12-01 DIAGNOSIS — M79671 Pain in right foot: Secondary | ICD-10-CM

## 2021-12-01 MED ORDER — ACETAMINOPHEN 500 MG PO TABS
1000.0000 mg | ORAL_TABLET | Freq: Four times a day (QID) | ORAL | 0 refills | Status: AC | PRN
Start: 2021-12-01 — End: ?

## 2021-12-01 MED ORDER — ACETAMINOPHEN 325 MG PO TABS
ORAL_TABLET | ORAL | Status: AC
  Filled 2021-12-01: qty 3

## 2021-12-01 MED ORDER — ACETAMINOPHEN 325 MG PO TABS
975.0000 mg | ORAL_TABLET | Freq: Once | ORAL | Status: AC
  Administered 2021-12-01: 975 mg via ORAL

## 2021-12-01 NOTE — ED Provider Notes (Signed)
[facility]    CSN: [id number] Arrival date & time: 12/01/21  1001      History   Chief Complaint Chief Complaint  Patient presents with   Foot Pain    HPI Lauren Holloway is a 88 y.o. female.   Patient presents urgent care with her daughter for evaluation of right foot pain that started yesterday abruptly and worsened overnight last night.  Pain is mostly to the dorsal medial aspect of the right foot slightly proximally to the MCP of the first toe.  Pain is currently a 10 on a scale of 0-10 and worse with walking.  Patient does not limp or walk with a cane at baseline but has been using her daughter's cane for ambulation assistance today.  Denies known injury to the right foot, twisting of the ankle, history of gouty arthritis, decreased sensation/numbness and tingling to the bilateral lower extremities, and recent falls.  Pain was worse overnight last night and patient took regular strength Tylenol at approximately 3 AM which did not help significantly with the pain.  She has not attempted applying ice to the area.  The area of tenderness is mildly swollen but patient is able to bear weight despite limp to the right foot.  Denies history of osteoporosis/osteopenia.  She is not a diabetic.  States she frequently wears high heels to church and last wore high heels 5 days ago.  She believes that she may have to stop wearing high heels as this may be making her pain worse.  No other aggravating or relieving factors identified at this time for patient's symptoms.  She is requesting x-ray today to rule out acute bony abnormality.   Foot Pain    Past Medical History:  Diagnosis Date   Cataract    Hyperlipidemia    Hypertension     Patient Active Problem List   Diagnosis Date Noted   Syncope 10/11/2020   Pacemaker 10/11/2020   AV heart block 11/20/2017   Pain in right hip 10/17/2017   Chronic left shoulder pain 03/17/2017   Grade 2 ankle sprain, unspecified laterality,  subsequent encounter 05/28/2016   Dyspnea 10/25/2013   Essential hypertension 05/18/2010   ABDOMINAL PAIN RIGHT LOWER QUADRANT 05/18/2010   PERSONAL HISTORY OF COLONIC POLYPS 05/18/2010    Past Surgical History:  Procedure Laterality Date   ABDOMINAL HYSTERECTOMY     APPENDECTOMY     BILATERAL OOPHORECTOMY     CATARACT EXTRACTION, BILATERAL Bilateral 1996   PACEMAKER IMPLANT N/A 11/24/2017   Procedure: PACEMAKER IMPLANT;  Surgeon: Evans Lance, MD;  Location: [facility];  Service: Cardiovascular;  Laterality: N/A;    OB History   No obstetric history on file.      Home Medications    Prior to Admission medications   Medication Sig Start Date End Date Taking? Authorizing Provider  acetaminophen (TYLENOL) 500 MG tablet Take 2 tablets (1,000 mg total) by mouth every 6 (six) hours as needed. 12/01/21   Talbot Grumbling, FNP  Alirocumab (PRALUENT) 150 MG/ML SOAJ Inject 150 mg into the skin every 14 (fourteen) days.    [provider]  ALPRAZolam Duanne Moron) 0.5 MG tablet Take 0.25 mg by mouth 2 (two) times daily as needed for anxiety or sleep. 06/17/16   [provider]  amLODipine (NORVASC) 5 MG tablet Take 1 tablet (5 mg total) by mouth daily. 10/11/20 01/09/21  Evans Lance, MD  escitalopram (LEXAPRO) 5 MG tablet Take 5 mg by mouth  daily.     [provider]  Fish Oil-Cholecalciferol (FISH OIL + D3 PO) Take 1 tablet by mouth daily.     [provider]  lisinopril-hydrochlorothiazide (PRINZIDE,ZESTORETIC) 20-25 MG per tablet Take 1 tablet by mouth daily. 05/03/14   [provider]  metoprolol tartrate (LOPRESSOR) 25 MG tablet Take 25 mg by mouth 2 (two) times daily.  03/18/13   [provider]  Multiple Vitamins-Minerals (MULTIVITAMIN WITH MINERALS) tablet Take 1 tablet by mouth daily.    [provider]  potassium chloride (KLOR-CON) 10 MEQ tablet Take 1 tablet by mouth daily. 09/16/19   [provider]   torsemide (DEMADEX) 20 MG tablet Take 1 tablet by mouth every morning. 11/17/18   [provider]  traMADol (ULTRAM) 50 MG tablet Take 1 tablet (50 mg total) by mouth every 12 (twelve) hours as needed. 11/13/21   Aundra Dubin, PA-C    Family History Family History  Problem Relation Age of Onset   Ovarian cancer Mother    Hypertension Mother    Hypertension Father    Heart disease Father    Hypertension Sister    Hypertension Sister     Social History Social History   Tobacco Use   Smoking status: Never   Smokeless tobacco: Never  Vaping Use   Vaping Use: Never used  Substance Use Topics   Alcohol use: Yes    Comment: MIX OCCASIONALLY   Drug use: No     Allergies   Sulfonamide derivatives, Latex, and Sulfasalazine   Review of Systems Review of Systems Per HPI  Physical Exam Triage Vital Signs ED Triage Vitals  Enc Vitals Group     BP 12/01/21 1019 (!) 145/80     Pulse Rate 12/01/21 1019 (!) 59     Resp 12/01/21 1019 18     Temp 12/01/21 1019 98.3 F (36.8 C)     Temp src --      SpO2 12/01/21 1019 94 %     Weight --      Height --      Head Circumference --      Peak Flow --      Pain Score 12/01/21 1017 10     Pain Loc --      Pain Edu? --      Excl. in [place]? --    No data found.  Updated Vital Signs BP (!) 145/80   Pulse (!) 59   Temp 98.3 F (36.8 C)   Resp 18   SpO2 94%   Visual Acuity Right Eye Distance:   Left Eye Distance:   Bilateral Distance:    Right Eye Near:   Left Eye Near:    Bilateral Near:     Physical Exam Vitals and nursing note reviewed.  Constitutional:      Appearance: She is not ill-appearing or toxic-appearing.  HENT:     Head: Normocephalic and atraumatic.     Right Ear: Hearing and external ear normal.     Left Ear: Hearing and external ear normal.     Nose: Nose normal.     Mouth/Throat:     Lips: Pink.  Eyes:     General: Lids are normal. Vision grossly intact. Gaze aligned appropriately.      Extraocular Movements: Extraocular movements intact.     Conjunctiva/sclera: Conjunctivae normal.  Pulmonary:     Effort: Pulmonary effort is normal.  Musculoskeletal:     Cervical back: Neck supple.  Feet:  Feet:     Comments: TTP localized to area indicated above.  There is slight nonpitting swelling to area of greatest tenderness without warmth or erythema.  5/5 strength against dorsi flexion and plantarflexion of the right foot.  Sensation is intact with less than 3 capillary refill to the right foot.  Patient has full range of motion of the right foot at the ankle joint and able to move all 5 toes voluntarily without difficulty despite tenderness. Skin:    General: Skin is warm and dry.     Capillary Refill: Capillary refill takes less than 2 seconds.     Findings: No rash.  Neurological:     General: No focal deficit present.     Mental Status: She is alert and oriented to person, place, and time. Mental status is at baseline.     Cranial Nerves: No dysarthria or facial asymmetry.  Psychiatric:        Mood and Affect: Mood normal.        Speech: Speech normal.        Behavior: Behavior normal.        Thought Content: Thought content normal.        Judgment: Judgment normal.      UC Treatments / Results  Labs (all labs ordered are listed, but only abnormal results are displayed) Labs Reviewed - No data to display  EKG   Radiology DG Foot Complete Right  Result Date: 12/01/2021 CLINICAL DATA:  Pain and swelling. EXAM: RIGHT FOOT COMPLETE - 3+ VIEW COMPARISON:  Right foot radiographs 03/25/2015 FINDINGS: Mildly decreased bone mineralization. Mild medial talonavicular joint space narrowing is similar to prior. Minimal chronic enthesopathic change at the Achilles insertion on the calcaneus. No acute fracture is seen. No dislocation. There are multiple tiny ossicles overlying the anterolateral ankle soft tissues on frontal view of the foot, unchanged from prior and  possibly representing vascular phleboliths versus the sequela of remote trauma. Similar oval ossicle overlying the distal medial posterior calf soft tissues, possibly vascular phleboliths. IMPRESSION: Mild medial talonavicular osteoarthritis, similar to prior. No acute fracture. Electronically Signed   By: Yvonne Kendall M.D.   On: 12/01/2021 10:50    Procedures Procedures (including critical care time)  Medications Ordered in UC Medications  acetaminophen (TYLENOL) tablet 975 mg (975 mg Oral Given 12/01/21 1044)    Initial Impression / Assessment and Plan / UC Course  I have reviewed the triage vital signs and the nursing notes.  Pertinent labs & imaging results that were available during my care of the patient were reviewed by me and considered in my medical decision making (see chart for details).   1. Right foot pain X-ray is unremarkable for acute bony abnormality and shows multiple degenerative changes without acute fracture or bony abnormality. Low suspicion for acute gouty arthritis based on physical exam findings. We will manage this as a foot sprain with RICE, tylenol every 6 hours as needed for pain and discomfort, and ace wrap for support and comfort. PCP follow-up in 1-2 weeks advised for ongoing pain and discomfort to the right foot. Discussed use of supportive shoes and patient to avoid wearing heels as this will likely worsen pain. Discussed use of ice 20 minutes off 20 minutes on and elevation of the right foot to reduce swelling and inflammation. Patient and daughter expressed understanding and agreement with plan.  Discussed physical exam and available lab work findings in clinic with patient.  Counseled patient regarding appropriate use  of medications and potential side effects for all medications recommended or prescribed today. Discussed red flag signs and symptoms of worsening condition,when to call the PCP office, return to urgent care, and when to seek higher level of care in  the emergency department. Patient verbalizes understanding and agreement with plan. All questions answered. Patient discharged in stable condition.  Final Clinical Impressions(s) / UC Diagnoses   Final diagnoses:  Foot pain, right     Discharge Instructions      Your x-ray is negative for acute bony abnormality.   Rest, ice, compress with Ace wrap, and elevate the right foot.  Wear comfortable shoes and avoid wearing heels for the next couple of weeks while your foot pain improves.  You may follow-up with your primary care provider for ongoing evaluation of right foot pain.  You may also call the orthopedic doctor listed on your paperwork for follow-up appointment if pain does not improve in the next week with rest, ice, and Tylenol.  You may take Tylenol every 6 hours extra strength at home for pain and discomfort.  Apply ice 20 minutes on 20 minutes off to improve swelling.  If you develop any new or worsening symptoms or do not improve in the next 2 to 3 days, please return.  If your symptoms are severe, please go to the emergency room.  Follow-up with your primary care provider for further evaluation and management of your symptoms as well as ongoing wellness visits.  I hope you feel better!     ED Prescriptions     Medication Sig Dispense Auth. Provider   acetaminophen (TYLENOL) 500 MG tablet Take 2 tablets (1,000 mg total) by mouth every 6 (six) hours as needed. 30 tablet Talbot Grumbling, FNP      PDMP not reviewed this encounter.   Talbot Grumbling, [place] 12/01/21 2117

## 2021-12-01 NOTE — ED Triage Notes (Signed)
PT reports RT foot pain that started last night. No injury to foot per Pt. Pt took tylenol with out relief . Pt limping and using a cane on arrival to room.

## 2021-12-01 NOTE — Discharge Instructions (Addendum)
Your x-ray is negative for acute bony abnormality.   Rest, ice, compress with Ace wrap, and elevate the right foot.  Wear comfortable shoes and avoid wearing heels for the next couple of weeks while your foot pain improves.  You may follow-up with your primary care provider for ongoing evaluation of right foot pain.  You may also call the orthopedic doctor listed on your paperwork for follow-up appointment if pain does not improve in the next week with rest, ice, and Tylenol.  You may take Tylenol every 6 hours extra strength at home for pain and discomfort.  Apply ice 20 minutes on 20 minutes off to improve swelling.  If you develop any new or worsening symptoms or do not improve in the next 2 to 3 days, please return.  If your symptoms are severe, please go to the emergency room.  Follow-up with your primary care provider for further evaluation and management of your symptoms as well as ongoing wellness visits.  I hope you feel better!

## 2021-12-07 ENCOUNTER — Ambulatory Visit (INDEPENDENT_AMBULATORY_CARE_PROVIDER_SITE_OTHER): Payer: Medicare PPO

## 2021-12-07 DIAGNOSIS — I441 Atrioventricular block, second degree: Secondary | ICD-10-CM | POA: Diagnosis not present

## 2021-12-08 ENCOUNTER — Other Ambulatory Visit: Payer: Self-pay | Admitting: Internal Medicine

## 2021-12-09 LAB — CUP PACEART REMOTE DEVICE CHECK
Battery Remaining Longevity: 45 mo
Battery Voltage: 2.95 V
Brady Statistic AP VP Percent: 75.78 %
Brady Statistic AP VS Percent: 0.04 %
Brady Statistic AS VP Percent: 24.05 %
Brady Statistic AS VS Percent: 0.13 %
Brady Statistic RA Percent Paced: 75.86 %
Brady Statistic RV Percent Paced: 99.83 %
Date Time Interrogation Session: 20231006022631
Implantable Lead Implant Date: 20190923
Implantable Lead Implant Date: 20190923
Implantable Lead Location: 753859
Implantable Lead Location: 753860
Implantable Lead Model: 3830
Implantable Lead Model: 5076
Implantable Pulse Generator Implant Date: 20190923
Lead Channel Impedance Value: 323 Ohm
Lead Channel Impedance Value: 342 Ohm
Lead Channel Impedance Value: 399 Ohm
Lead Channel Impedance Value: 418 Ohm
Lead Channel Pacing Threshold Amplitude: 0.5 V
Lead Channel Pacing Threshold Amplitude: 2.375 V
Lead Channel Pacing Threshold Pulse Width: 0.4 ms
Lead Channel Pacing Threshold Pulse Width: 0.4 ms
Lead Channel Sensing Intrinsic Amplitude: 1.625 mV
Lead Channel Sensing Intrinsic Amplitude: 1.625 mV
Lead Channel Sensing Intrinsic Amplitude: 10.625 mV
Lead Channel Sensing Intrinsic Amplitude: 10.625 mV
Lead Channel Setting Pacing Amplitude: 1.5 V
Lead Channel Setting Pacing Amplitude: 3 V
Lead Channel Setting Pacing Pulse Width: 1 ms
Lead Channel Setting Sensing Sensitivity: 1.2 mV

## 2021-12-11 NOTE — Progress Notes (Signed)
Remote pacemaker transmission.   

## 2021-12-27 ENCOUNTER — Ambulatory Visit: Payer: Medicare PPO | Attending: Internal Medicine | Admitting: Internal Medicine

## 2021-12-27 VITALS — BP 144/62 | HR 60 | Ht 64.0 in | Wt 159.0 lb

## 2021-12-27 DIAGNOSIS — I10 Essential (primary) hypertension: Secondary | ICD-10-CM

## 2021-12-27 DIAGNOSIS — Z950 Presence of cardiac pacemaker: Secondary | ICD-10-CM

## 2021-12-27 DIAGNOSIS — I4430 Unspecified atrioventricular block: Secondary | ICD-10-CM | POA: Diagnosis not present

## 2021-12-27 LAB — CUP PACEART INCLINIC DEVICE CHECK
Battery Remaining Longevity: 48 mo
Battery Voltage: 2.95 V
Brady Statistic AP VP Percent: 66.86 %
Brady Statistic AP VS Percent: 2.69 %
Brady Statistic AS VP Percent: 27.68 %
Brady Statistic AS VS Percent: 2.77 %
Brady Statistic RA Percent Paced: 69.61 %
Brady Statistic RV Percent Paced: 94.54 %
Date Time Interrogation Session: 20231026090726
Implantable Lead Connection Status: 753985
Implantable Lead Connection Status: 753985
Implantable Lead Implant Date: 20190923
Implantable Lead Implant Date: 20190923
Implantable Lead Location: 753859
Implantable Lead Location: 753860
Implantable Lead Model: 3830
Implantable Lead Model: 5076
Implantable Pulse Generator Implant Date: 20190923
Lead Channel Impedance Value: 323 Ohm
Lead Channel Impedance Value: 361 Ohm
Lead Channel Impedance Value: 399 Ohm
Lead Channel Impedance Value: 418 Ohm
Lead Channel Pacing Threshold Amplitude: 0.5 V
Lead Channel Pacing Threshold Amplitude: 1.25 V
Lead Channel Pacing Threshold Amplitude: 2.5 V
Lead Channel Pacing Threshold Pulse Width: 0.4 ms
Lead Channel Pacing Threshold Pulse Width: 0.4 ms
Lead Channel Pacing Threshold Pulse Width: 1 ms
Lead Channel Sensing Intrinsic Amplitude: 1.25 mV
Lead Channel Sensing Intrinsic Amplitude: 1.25 mV
Lead Channel Sensing Intrinsic Amplitude: 1.3 mV
Lead Channel Sensing Intrinsic Amplitude: 10.625 mV
Lead Channel Sensing Intrinsic Amplitude: 10.625 mV
Lead Channel Sensing Intrinsic Amplitude: >80
Lead Channel Setting Pacing Amplitude: 1.5 V
Lead Channel Setting Pacing Amplitude: 2.5 V
Lead Channel Setting Pacing Pulse Width: 1 ms
Lead Channel Setting Sensing Sensitivity: 1.2 mV
Zone Setting Status: 755011
Zone Setting Status: 755011

## 2021-12-27 NOTE — Progress Notes (Signed)
HPI Ms. Lauren Holloway returns today for followup. She is a pleasant 88 yo woman with a h/o symptomatic 2:1 AV block, who is s/p PPM insertion. She has HTN. She denies chest pain or sob. No edema. No syncope. She feels well. She has gained a few pounds since her last visit.  Allergies  Allergen Reactions   Metformin     Other reaction(s): GI Upset (intolerance)   Sulfonamide Derivatives     HIVES    Latex Rash   Molnupiravir Rash   Sulfasalazine     Other reaction(s): Other (See Comments) HIVES     Current Outpatient Medications  Medication Sig Dispense Refill   acetaminophen (TYLENOL) 500 MG tablet Take 2 tablets (1,000 mg total) by mouth every 6 (six) hours as needed. 30 tablet 0   Alirocumab (PRALUENT) 150 MG/ML SOAJ Inject 150 mg into the skin every 14 (fourteen) days.     ALPRAZolam (XANAX) 0.5 MG tablet Take 0.25 mg by mouth 2 (two) times daily as needed for anxiety or sleep.  3   amLODipine (NORVASC) 5 MG tablet TAKE 1 TABLET EVERY DAY. (DOSE DECREASED) 90 tablet 0   escitalopram (LEXAPRO) 10 MG tablet Take 10 mg by mouth daily.     Fish Oil-Cholecalciferol (FISH OIL + D3 PO) Take 1 tablet by mouth daily.      lisinopril-hydrochlorothiazide (PRINZIDE,ZESTORETIC) 20-25 MG per tablet Take 1 tablet by mouth daily.     metoprolol tartrate (LOPRESSOR) 25 MG tablet Take 25 mg by mouth 2 (two) times daily.      Multiple Vitamins-Minerals (MULTIVITAMIN WITH MINERALS) tablet Take 1 tablet by mouth daily.     potassium chloride (KLOR-CON M) 10 MEQ tablet Take 30 mEq by mouth daily.     torsemide (DEMADEX) 20 MG tablet Take 1 tablet by mouth every morning. TAKES 2 TABS MON & THU     traMADol (ULTRAM) 50 MG tablet Take 1 tablet (50 mg total) by mouth every 12 (twelve) hours as needed. 30 tablet 2   No current facility-administered medications for this visit.     Past Medical History:  Diagnosis Date   Cataract    Hyperlipidemia    Hypertension     ROS:   All systems  reviewed and negative except as noted in the HPI.   Past Surgical History:  Procedure Laterality Date   ABDOMINAL HYSTERECTOMY     APPENDECTOMY     BILATERAL OOPHORECTOMY     CATARACT EXTRACTION, BILATERAL Bilateral 1996   PACEMAKER IMPLANT N/A 11/24/2017   Procedure: PACEMAKER IMPLANT;  Surgeon: Evans Lance, MD;  Location: [facility];  Service: Cardiovascular;  Laterality: N/A;     Family History  Problem Relation Age of Onset   Ovarian cancer Mother    Hypertension Mother    Hypertension Father    Heart disease Father    Hypertension Sister    Hypertension Sister      Social History   Socioeconomic History   Marital status: Widowed    Spouse name: Not on file   Number of children: 1   Years of education: Not on file   Highest education level: Not on file  Occupational History   Not on file  Tobacco Use   Smoking status: Never   Smokeless tobacco: Never  Vaping Use   Vaping Use: Never used  Substance and Sexual Activity   Alcohol use: Yes    Comment: MIX OCCASIONALLY   Drug use: No  Sexual activity: Not on file  Other Topics Concern   Not on file  Social History Narrative   Not on file   Social Determinants of Health   Financial Resource Strain: Not on file  Food Insecurity: Not on file  Transportation Needs: Not on file  Physical Activity: Not on file  Stress: Not on file  Social Connections: Not on file  Intimate Partner Violence: Not on file     BP (!) 144/62   Pulse 60   Ht '5\' 4"'$  (1.626 m)   Wt 159 lb (72.1 kg)   SpO2 95%   BMI 27.29 kg/m   Physical Exam:  Well appearing NAD HEENT: Unremarkable Neck:  No JVD, no thyromegally Lymphatics:  No adenopathy Back:  No CVA tenderness Lungs:  Clear with no wheezes HEART:  Regular rate rhythm, no murmurs, no rubs, no clicks Abd:  soft, positive bowel sounds, no organomegally, no rebound, no guarding Ext:  2 plus pulses, no edema, no cyanosis, no clubbing Skin:  No rashes no  nodules Neuro:  CN II through XII intact, motor grossly intact  DEVICE  Normal device function.  See PaceArt for details.   Assess/Plan:   Heart block - she is doing well s/p PPM insertion.  PPM - her Medtronic device is working normally.  HTN - her bp is up a bit and she is encouraged to lose weight. Dyslipidemia - she is on praluent.    Lauren Overlie Tashon Capp,MD

## 2021-12-27 NOTE — Patient Instructions (Signed)
Medication Instructions:  Your physician recommends that you continue on your current medications as directed. Please refer to the Current Medication list given to you today.  *If you need a refill on your cardiac medications before your next appointment, please call your pharmacy*  Lab Work: None ordered.  If you have labs (blood work) drawn today and your tests are completely normal, you will receive your results only by: [place] (if you have MyChart) OR A paper copy in the mail If you have any lab test that is abnormal or we need to change your treatment, we will call you to review the results.  Testing/Procedures: None ordered.  Follow-Up: At [facility], you and your health needs are our priority.  As part of our continuing mission to provide you with exceptional heart care, we have created designated Provider Care Teams.  These Care Teams include your primary Cardiologist (physician) and Advanced Practice Providers (APPs -  Physician Assistants and Nurse Practitioners) who all work together to provide you with the care you need, when you need it.  We recommend signing up for the patient portal called "MyChart".  Sign up information is provided on this After Visit Summary.  MyChart is used to connect with patients for Virtual Visits (Telemedicine).  Patients are able to view lab/test results, encounter notes, upcoming appointments, etc.  Non-urgent messages can be sent to your provider as well.   To learn more about what you can do with MyChart, go to [email or web address].    Your next appointment:   1 year(s)  The format for your next appointment:   In Person  Provider:   Cristopher Peru, MD{or one of the following Advanced Practice Providers on your designated Care Team:   Tommye Standard, [place] Legrand Como "Jonni Sanger" Chalmers Cater, [place]  Remote monitoring is used to monitor your Pacemaker from home. This monitoring reduces the number of office visits required to check your device to  one time per year. It allows [place] to keep an eye on the functioning of your device to ensure it is working properly. You are scheduled for a device check from home on 03/08/22. You may send your transmission at any time that day. If you have a wireless device, the transmission will be sent automatically. After your physician reviews your transmission, you will receive a postcard with your next transmission date.  Important Information About Sugar

## 2022-01-15 DIAGNOSIS — R7301 Impaired fasting glucose: Secondary | ICD-10-CM | POA: Diagnosis not present

## 2022-01-15 DIAGNOSIS — E785 Hyperlipidemia, unspecified: Secondary | ICD-10-CM | POA: Diagnosis not present

## 2022-01-15 DIAGNOSIS — I10 Essential (primary) hypertension: Secondary | ICD-10-CM | POA: Diagnosis not present

## 2022-01-21 DIAGNOSIS — R7301 Impaired fasting glucose: Secondary | ICD-10-CM | POA: Diagnosis not present

## 2022-01-21 DIAGNOSIS — E785 Hyperlipidemia, unspecified: Secondary | ICD-10-CM | POA: Diagnosis not present

## 2022-01-21 DIAGNOSIS — I10 Essential (primary) hypertension: Secondary | ICD-10-CM | POA: Diagnosis not present

## 2022-01-21 DIAGNOSIS — R7989 Other specified abnormal findings of blood chemistry: Secondary | ICD-10-CM | POA: Diagnosis not present

## 2022-02-01 ENCOUNTER — Encounter: Payer: Medicare PPO | Admitting: Internal Medicine

## 2022-02-04 ENCOUNTER — Encounter: Payer: Medicare PPO | Admitting: Internal Medicine

## 2022-02-05 ENCOUNTER — Encounter: Payer: Medicare PPO | Admitting: Internal Medicine

## 2022-02-08 DIAGNOSIS — I2729 Other secondary pulmonary hypertension: Secondary | ICD-10-CM | POA: Diagnosis not present

## 2022-02-08 DIAGNOSIS — I10 Essential (primary) hypertension: Secondary | ICD-10-CM | POA: Diagnosis not present

## 2022-02-08 DIAGNOSIS — M1990 Unspecified osteoarthritis, unspecified site: Secondary | ICD-10-CM | POA: Diagnosis not present

## 2022-02-08 DIAGNOSIS — R600 Localized edema: Secondary | ICD-10-CM | POA: Diagnosis not present

## 2022-02-08 DIAGNOSIS — Z0000 Encounter for general adult medical examination without abnormal findings: Secondary | ICD-10-CM | POA: Diagnosis not present

## 2022-02-08 DIAGNOSIS — M6530 Trigger finger, unspecified finger: Secondary | ICD-10-CM | POA: Diagnosis not present

## 2022-02-08 DIAGNOSIS — E785 Hyperlipidemia, unspecified: Secondary | ICD-10-CM | POA: Diagnosis not present

## 2022-02-08 DIAGNOSIS — R82998 Other abnormal findings in urine: Secondary | ICD-10-CM | POA: Diagnosis not present

## 2022-02-08 DIAGNOSIS — K649 Unspecified hemorrhoids: Secondary | ICD-10-CM | POA: Diagnosis not present

## 2022-02-08 DIAGNOSIS — Z1331 Encounter for screening for depression: Secondary | ICD-10-CM | POA: Diagnosis not present

## 2022-02-08 DIAGNOSIS — R7301 Impaired fasting glucose: Secondary | ICD-10-CM | POA: Diagnosis not present

## 2022-02-08 DIAGNOSIS — Z1339 Encounter for screening examination for other mental health and behavioral disorders: Secondary | ICD-10-CM | POA: Diagnosis not present

## 2022-03-06 DIAGNOSIS — H2000 Unspecified acute and subacute iridocyclitis: Secondary | ICD-10-CM | POA: Diagnosis not present

## 2022-03-08 ENCOUNTER — Ambulatory Visit (INDEPENDENT_AMBULATORY_CARE_PROVIDER_SITE_OTHER): Payer: Medicare PPO

## 2022-03-08 DIAGNOSIS — I441 Atrioventricular block, second degree: Secondary | ICD-10-CM

## 2022-03-08 LAB — CUP PACEART REMOTE DEVICE CHECK
Battery Remaining Longevity: 39 mo
Battery Voltage: 2.94 V
Brady Statistic AP VP Percent: 69.73 %
Brady Statistic AP VS Percent: 0.01 %
Brady Statistic AS VP Percent: 30.19 %
Brady Statistic AS VS Percent: 0.07 %
Brady Statistic RA Percent Paced: 69.76 %
Brady Statistic RV Percent Paced: 99.92 %
Date Time Interrogation Session: 20240104210320
Implantable Lead Connection Status: 753985
Implantable Lead Connection Status: 753985
Implantable Lead Implant Date: 20190923
Implantable Lead Implant Date: 20190923
Implantable Lead Location: 753859
Implantable Lead Location: 753860
Implantable Lead Model: 3830
Implantable Lead Model: 5076
Implantable Pulse Generator Implant Date: 20190923
Lead Channel Impedance Value: 323 Ohm
Lead Channel Impedance Value: 342 Ohm
Lead Channel Impedance Value: 380 Ohm
Lead Channel Impedance Value: 418 Ohm
Lead Channel Pacing Threshold Amplitude: 0.5 V
Lead Channel Pacing Threshold Amplitude: 1.75 V
Lead Channel Pacing Threshold Pulse Width: 0.4 ms
Lead Channel Pacing Threshold Pulse Width: 0.4 ms
Lead Channel Sensing Intrinsic Amplitude: 10.125 mV
Lead Channel Sensing Intrinsic Amplitude: 10.125 mV
Lead Channel Sensing Intrinsic Amplitude: 2 mV
Lead Channel Sensing Intrinsic Amplitude: 2 mV
Lead Channel Setting Pacing Amplitude: 1.5 V
Lead Channel Setting Pacing Amplitude: 2.5 V
Lead Channel Setting Pacing Pulse Width: 1 ms
Lead Channel Setting Sensing Sensitivity: 1.2 mV
Zone Setting Status: 755011
Zone Setting Status: 755011

## 2022-03-26 NOTE — Progress Notes (Signed)
Remote pacemaker transmission.   

## 2022-04-26 DIAGNOSIS — M1812 Unilateral primary osteoarthritis of first carpometacarpal joint, left hand: Secondary | ICD-10-CM | POA: Diagnosis not present

## 2022-04-26 DIAGNOSIS — M79642 Pain in left hand: Secondary | ICD-10-CM | POA: Diagnosis not present

## 2022-05-13 DIAGNOSIS — I2729 Other secondary pulmonary hypertension: Secondary | ICD-10-CM | POA: Diagnosis not present

## 2022-05-13 DIAGNOSIS — M5136 Other intervertebral disc degeneration, lumbar region: Secondary | ICD-10-CM | POA: Diagnosis not present

## 2022-05-13 DIAGNOSIS — M545 Low back pain, unspecified: Secondary | ICD-10-CM | POA: Diagnosis not present

## 2022-05-13 DIAGNOSIS — I10 Essential (primary) hypertension: Secondary | ICD-10-CM | POA: Diagnosis not present

## 2022-05-13 DIAGNOSIS — K5900 Constipation, unspecified: Secondary | ICD-10-CM | POA: Diagnosis not present

## 2022-06-07 ENCOUNTER — Ambulatory Visit (INDEPENDENT_AMBULATORY_CARE_PROVIDER_SITE_OTHER): Payer: Medicare PPO

## 2022-06-07 DIAGNOSIS — R051 Acute cough: Secondary | ICD-10-CM | POA: Diagnosis not present

## 2022-06-07 DIAGNOSIS — E876 Hypokalemia: Secondary | ICD-10-CM | POA: Diagnosis not present

## 2022-06-07 DIAGNOSIS — Z1152 Encounter for screening for COVID-19: Secondary | ICD-10-CM | POA: Diagnosis not present

## 2022-06-07 DIAGNOSIS — J40 Bronchitis, not specified as acute or chronic: Secondary | ICD-10-CM | POA: Diagnosis not present

## 2022-06-07 DIAGNOSIS — R5383 Other fatigue: Secondary | ICD-10-CM | POA: Diagnosis not present

## 2022-06-07 DIAGNOSIS — I10 Essential (primary) hypertension: Secondary | ICD-10-CM | POA: Diagnosis not present

## 2022-06-07 DIAGNOSIS — I441 Atrioventricular block, second degree: Secondary | ICD-10-CM

## 2022-06-07 DIAGNOSIS — J029 Acute pharyngitis, unspecified: Secondary | ICD-10-CM | POA: Diagnosis not present

## 2022-06-07 DIAGNOSIS — J019 Acute sinusitis, unspecified: Secondary | ICD-10-CM | POA: Diagnosis not present

## 2022-06-07 LAB — CUP PACEART REMOTE DEVICE CHECK
Battery Remaining Longevity: 32 mo
Battery Voltage: 2.93 V
Brady Statistic AP VP Percent: 72.47 %
Brady Statistic AP VS Percent: 0.01 %
Brady Statistic AS VP Percent: 27.43 %
Brady Statistic AS VS Percent: 0.09 %
Brady Statistic RA Percent Paced: 72.51 %
Brady Statistic RV Percent Paced: 99.9 %
Date Time Interrogation Session: 20240405061621
Implantable Lead Connection Status: 753985
Implantable Lead Connection Status: 753985
Implantable Lead Implant Date: 20190923
Implantable Lead Implant Date: 20190923
Implantable Lead Location: 753859
Implantable Lead Location: 753860
Implantable Lead Model: 3830
Implantable Lead Model: 5076
Implantable Pulse Generator Implant Date: 20190923
Lead Channel Impedance Value: 323 Ohm
Lead Channel Impedance Value: 342 Ohm
Lead Channel Impedance Value: 380 Ohm
Lead Channel Impedance Value: 399 Ohm
Lead Channel Pacing Threshold Amplitude: 0.5 V
Lead Channel Pacing Threshold Amplitude: 2.375 V
Lead Channel Pacing Threshold Pulse Width: 0.4 ms
Lead Channel Pacing Threshold Pulse Width: 0.4 ms
Lead Channel Sensing Intrinsic Amplitude: 1.125 mV
Lead Channel Sensing Intrinsic Amplitude: 1.125 mV
Lead Channel Sensing Intrinsic Amplitude: 9.375 mV
Lead Channel Sensing Intrinsic Amplitude: 9.375 mV
Lead Channel Setting Pacing Amplitude: 1.5 V
Lead Channel Setting Pacing Amplitude: 2.5 V
Lead Channel Setting Pacing Pulse Width: 1 ms
Lead Channel Setting Sensing Sensitivity: 1.2 mV
Zone Setting Status: 755011
Zone Setting Status: 755011

## 2022-06-15 ENCOUNTER — Encounter (HOSPITAL_COMMUNITY): Payer: Self-pay

## 2022-06-15 ENCOUNTER — Ambulatory Visit (HOSPITAL_COMMUNITY)
Admission: EM | Admit: 2022-06-15 | Discharge: 2022-06-15 | Disposition: A | Discharge status: Home / Self Care | Payer: Medicare PPO | Attending: Internal Medicine | Admitting: Internal Medicine

## 2022-06-15 ENCOUNTER — Ambulatory Visit (INDEPENDENT_AMBULATORY_CARE_PROVIDER_SITE_OTHER): Payer: Medicare PPO

## 2022-06-15 DIAGNOSIS — R062 Wheezing: Secondary | ICD-10-CM | POA: Diagnosis not present

## 2022-06-15 DIAGNOSIS — R059 Cough, unspecified: Secondary | ICD-10-CM

## 2022-06-15 DIAGNOSIS — R053 Chronic cough: Secondary | ICD-10-CM

## 2022-06-15 DIAGNOSIS — J329 Chronic sinusitis, unspecified: Secondary | ICD-10-CM | POA: Diagnosis not present

## 2022-06-15 DIAGNOSIS — J40 Bronchitis, not specified as acute or chronic: Secondary | ICD-10-CM

## 2022-06-15 DIAGNOSIS — R0602 Shortness of breath: Secondary | ICD-10-CM | POA: Diagnosis not present

## 2022-06-15 MED ORDER — ALBUTEROL SULFATE HFA 108 (90 BASE) MCG/ACT IN AERS
2.0000 | INHALATION_SPRAY | Freq: Once | RESPIRATORY_TRACT | Status: AC
  Administered 2022-06-15: 2 via RESPIRATORY_TRACT

## 2022-06-15 MED ORDER — ALBUTEROL SULFATE HFA 108 (90 BASE) MCG/ACT IN AERS
INHALATION_SPRAY | RESPIRATORY_TRACT | Status: AC
  Filled 2022-06-15: qty 6.7

## 2022-06-15 MED ORDER — AZITHROMYCIN 250 MG PO TABS: 250.0000 mg | ORAL_TABLET | Freq: Every day | ORAL | 0 refills | Status: DC

## 2022-06-15 NOTE — ED Triage Notes (Signed)
Patient here today with c/o productive cough, SOB, wheeze X 2 weeks. She received and has been taking Prednisone,  Cefdinir, and Benzonatate from her PCP on 06/07/2022 with no relief. No fever. No sick contacts. No recent travel.  She is also having left side LB pain that started around 2 weeks ago as well. It does not radiate. Pain is dull and a 7/10. She has been taking Tylenol arthritis with some relief. She does have difficulty hold her urine while she is coughing.

## 2022-06-15 NOTE — Discharge Instructions (Addendum)
Your chest x-ray was negative for pneumonia.  This is very reassuring!  I would like to treat you with another antibiotic called azithromycin.  Take this as prescribed.  You will take 2 pills today, then 1 pill for the next 4 days.  Take this with food to avoid stomach upset.  You may continue using Tessalon Perles as needed for cough.  I gave you your first dose of albuterol inhaler in the clinic.  You may use this every 4-6 hours as needed for cough, shortness of breath, and wheeze.  Please continue using Mucinex as this will help to loosen up the mucus in your chest and help with your cough.  If you develop any new or worsening symptoms or do not improve in the next 2 to 3 days, please return.  If your symptoms are severe, please go to the emergency room.  Follow-up with your primary care provider for further evaluation and management of your symptoms as well as ongoing wellness visits.  I hope you feel better!

## 2022-06-15 NOTE — ED Provider Notes (Signed)
[facility]    CSN: [id number] Arrival date & time: 06/15/22  1003      History   Chief Complaint Chief Complaint  Patient presents with   Cough   Back Pain    HPI Lauren Holloway is a 89 y.o. female.   Patient with history of hypertension, pacemaker, and hyperlipidemia presents to urgent care for evaluation of cough, shortness of breath, and wheeze for the last 2 weeks. She was seen by PCP 8 days ago on June 07, 2022 and prescribed prednisone, cefdinir, and tessalon perles for symptoms. She has been taking medications as prescribed and states she experienced relief initially after finishing antibiotic, however symptoms have returned and worsened.  Cough is productive with thick brown/white sputum.  Experiencing maxillofacial pain as well that is currently a 7 on a scale of 0-10.  She states she has been through "2 boxes of Kleenex in the last 2 days" due to blowing her nose so frequently.  She has never been a smoker and denies history of chronic lung disease.  Cough is much worse at nighttime and is dry/hacking when coughing fits happen.  No chest pain or heart palpitations.  She has continued using Mucinex and Tessalon Perles without much relief of symptoms.  Denies fever, chills, and bodyaches.  She states before use of the cefdinir antibiotic, her temperature never got above 99.5.  No known sick contacts with similar symptoms.  Denies recent diarrhea associated with antibiotic use.  No recent blood/mucus in the stools or heartburn associated with prednisone use.  No orthopnea or leg swelling.   Cough Back Pain   Past Medical History:  Diagnosis Date   Cataract    Hyperlipidemia    Hypertension     Patient Active Problem List   Diagnosis Date Noted   Syncope 10/11/2020   Pacemaker 10/11/2020   AV heart block 11/20/2017   Pain in right hip 10/17/2017   Chronic left shoulder pain 03/17/2017   Grade 2 ankle sprain, unspecified laterality, subsequent encounter  05/28/2016   Dyspnea 10/25/2013   Hypertension 05/18/2010   ABDOMINAL PAIN RIGHT LOWER QUADRANT 05/18/2010   PERSONAL HISTORY OF COLONIC POLYPS 05/18/2010    Past Surgical History:  Procedure Laterality Date   ABDOMINAL HYSTERECTOMY     APPENDECTOMY     BILATERAL OOPHORECTOMY     CATARACT EXTRACTION, BILATERAL Bilateral 1996   PACEMAKER IMPLANT N/A 11/24/2017   Procedure: PACEMAKER IMPLANT;  Surgeon: Marinus Maw, MD;  Location: [facility];  Service: Cardiovascular;  Laterality: N/A;    OB History   No obstetric history on file.      Home Medications    Prior to Admission medications   Medication Sig Start Date End Date Taking? Authorizing Provider  acetaminophen (TYLENOL) 500 MG tablet Take 2 tablets (1,000 mg total) by mouth every 6 (six) hours as needed. 12/01/21  Yes Jef Futch, Donavan Burnet, FNP  Alirocumab (PRALUENT) 150 MG/ML SOAJ Inject 150 mg into the skin every 14 (fourteen) days.   Yes [provider]  ALPRAZolam Prudy Feeler) 0.5 MG tablet Take 0.25 mg by mouth 2 (two) times daily as needed for anxiety or sleep. 06/17/16  Yes [provider]  amLODipine (NORVASC) 5 MG tablet TAKE 1 TABLET EVERY DAY. (DOSE DECREASED) 12/10/21  Yes Marinus Maw, MD  azithromycin (ZITHROMAX) 250 MG tablet Take 1 tablet (250 mg total) by mouth daily. Take first 2 tablets together, then 1 every day until finished. 06/15/22  Yes Amil Moseman,  Donavan Burnet, FNP  escitalopram (LEXAPRO) 10 MG tablet Take 10 mg by mouth daily. 10/16/21  Yes [provider]  Fish Oil-Cholecalciferol (FISH OIL + D3 PO) Take 1 tablet by mouth daily.    Yes [provider]  lisinopril-hydrochlorothiazide (PRINZIDE,ZESTORETIC) 20-25 MG per tablet Take 1 tablet by mouth daily. 05/03/14  Yes [provider]  metoprolol tartrate (LOPRESSOR) 25 MG tablet Take 25 mg by mouth 2 (two) times daily.  03/18/13  Yes [provider]  Multiple Vitamins-Minerals (MULTIVITAMIN WITH  MINERALS) tablet Take 1 tablet by mouth daily.   Yes [provider]  potassium chloride (KLOR-CON M) 10 MEQ tablet Take 30 mEq by mouth daily. 12/10/21  Yes [provider]  torsemide (DEMADEX) 20 MG tablet Take 1 tablet by mouth every morning. TAKES 2 TABS MON & THU 11/17/18  Yes [provider]  traMADol (ULTRAM) 50 MG tablet Take 1 tablet (50 mg total) by mouth every 12 (twelve) hours as needed. 11/13/21   Cristie Hem, PA-C    Family History Family History  Problem Relation Age of Onset   Ovarian cancer Mother    Hypertension Mother    Hypertension Father    Heart disease Father    Hypertension Sister    Hypertension Sister     Social History Social History   Tobacco Use   Smoking status: Never   Smokeless tobacco: Never  Vaping Use   Vaping Use: Never used  Substance Use Topics   Alcohol use: Yes    Comment: MIX OCCASIONALLY   Drug use: No     Allergies   Metformin, Sulfonamide derivatives, Latex, Molnupiravir, and Sulfasalazine   Review of Systems Review of Systems  Respiratory:  Positive for cough.   Musculoskeletal:  Positive for back pain.  Per HPI   Physical Exam Triage Vital Signs ED Triage Vitals [06/15/22 1028]  Enc Vitals Group     BP (!) 161/76     Pulse Rate 64     Resp 16     Temp 98.4 F (36.9 C)     Temp Source Oral     SpO2 94 %     Weight 153 lb (69.4 kg)     Height 5\' 4"  (1.626 m)     Head Circumference      Peak Flow      Pain Score 7     Pain Loc      Pain Edu?      Excl. in GC?    No data found.  Updated Vital Signs BP (!) 161/76 (BP Location: Right Arm)   Pulse 64   Temp 98.4 F (36.9 C) (Oral)   Resp 16   Ht 5\' 4"  (1.626 m)   Wt 153 lb (69.4 kg)   SpO2 94%   BMI 26.26 kg/m   Visual Acuity Right Eye Distance:   Left Eye Distance:   Bilateral Distance:    Right Eye Near:   Left Eye Near:    Bilateral Near:     Physical Exam Vitals and nursing note reviewed.  Constitutional:       Appearance: She is not ill-appearing or toxic-appearing.  HENT:     Head: Normocephalic and atraumatic.     Right Ear: Hearing, tympanic membrane, ear canal and external ear normal.     Left Ear: Hearing, tympanic membrane, ear canal and external ear normal.     Nose: Congestion present.     Mouth/Throat:     Lips:  Pink.     Mouth: Mucous membranes are moist. No injury.     Tongue: No lesions. Tongue does not deviate from midline.     Palate: No mass and lesions.     Pharynx: Oropharynx is clear. Uvula midline. No pharyngeal swelling, oropharyngeal exudate, posterior oropharyngeal erythema or uvula swelling.     Tonsils: No tonsillar exudate or tonsillar abscesses.  Eyes:     General: Lids are normal. Vision grossly intact. Gaze aligned appropriately.        Right eye: No discharge.        Left eye: No discharge.     Extraocular Movements: Extraocular movements intact.     Conjunctiva/sclera: Conjunctivae normal.  Cardiovascular:     Rate and Rhythm: Normal rate and regular rhythm.     Heart sounds: Normal heart sounds, S1 normal and S2 normal.  Pulmonary:     Effort: Pulmonary effort is normal. No respiratory distress.     Breath sounds: Normal air entry. Rhonchi present. No wheezing or rales.     Comments: Rhonchi to the right lower lung field.  Speaking in full sentences without difficulty. Musculoskeletal:     Cervical back: Neck supple.     Right lower leg: No edema.     Left lower leg: No edema.  Lymphadenopathy:     Cervical: No cervical adenopathy.  Skin:    General: Skin is warm and dry.     Capillary Refill: Capillary refill takes less than 2 seconds.     Findings: No rash.  Neurological:     General: No focal deficit present.     Mental Status: She is alert and oriented to person, place, and time. Mental status is at baseline.     Cranial Nerves: No dysarthria or facial asymmetry.  Psychiatric:        Mood and Affect: Mood normal.        Speech: Speech  normal.        Behavior: Behavior normal.        Thought Content: Thought content normal.        Judgment: Judgment normal.      UC Treatments / Results  Labs (all labs ordered are listed, but only abnormal results are displayed) Labs Reviewed - No data to display  EKG   Radiology DG Chest 2 View  Result Date: 06/15/2022 CLINICAL DATA:  Cough for 2 weeks. Shortness of breath and wheezing. EXAM: CHEST - 2 VIEW COMPARISON:  Chest x-ray dated 11/25/2017. FINDINGS: Size and mediastinal contours are within normal limits. Lungs are clear. No pleural effusion or pneumothorax is seen. LEFT chest wall pacemaker/ICD apparatus in place. No acute-appearing osseous abnormality. IMPRESSION: No active cardiopulmonary disease. No evidence of pneumonia or pulmonary edema. Electronically Signed   By: Bary Richard M.D.   On: 06/15/2022 11:07    Procedures Procedures (including critical care time)  Medications Ordered in UC Medications  albuterol (VENTOLIN HFA) 108 (90 Base) MCG/ACT inhaler 2 puff (2 puffs Inhalation Given 06/15/22 1116)    Initial Impression / Assessment and Plan / UC Course  I have reviewed the triage vital signs and the nursing notes.  Pertinent labs & imaging results that were available during my care of the patient were reviewed by me and considered in my medical decision making (see chart for details).   1.  Persistent cough, sinobronchitis Chest x-ray is unremarkable for signs of acute cardiopulmonary disease.  Would like to treat persistent symptoms with another round of antibiotic.  Azithromycin antibiotic for antiinflammatory and antibacterial properties.  May continue use of Tessalon Perles every 8 hours as needed for cough.  Albuterol inhaler as needed every 4-6 hours for cough, shortness of breath, and wheeze.  Patient given first dose of albuterol in clinic and was able to demonstrate how to use this appropriately.  Advised to rinse out her mouth after use.  May continue  using guaifenesin as needed for nasal congestion and cough.  She is overall nontoxic in appearance with hemodynamically stable vital signs.  No new oxygen requirement.  Oxygen saturation is 94% on room air to her baseline of 94 to 95% on room air.  Afebrile.  PCP follow-up recommended in the next 1 to 2 weeks.   Discussed physical exam and available lab work findings in clinic with patient.  Counseled patient regarding appropriate use of medications and potential side effects for all medications recommended or prescribed today. Discussed red flag signs and symptoms of worsening condition,when to call the PCP office, return to urgent care, and when to seek higher level of care in the emergency department. Patient verbalizes understanding and agreement with plan. All questions answered. Patient discharged in stable condition.    Final Clinical Impressions(s) / UC Diagnoses   Final diagnoses:  Persistent cough  Sinobronchitis     Discharge Instructions      Your chest x-ray was negative for pneumonia.  This is very reassuring!  I would like to treat you with another antibiotic called azithromycin.  Take this as prescribed.  You will take 2 pills today, then 1 pill for the next 4 days.  Take this with food to avoid stomach upset.  You may continue using Tessalon Perles as needed for cough.  I gave you your first dose of albuterol inhaler in the clinic.  You may use this every 4-6 hours as needed for cough, shortness of breath, and wheeze.  Please continue using Mucinex as this will help to loosen up the mucus in your chest and help with your cough.  If you develop any new or worsening symptoms or do not improve in the next 2 to 3 days, please return.  If your symptoms are severe, please go to the emergency room.  Follow-up with your primary care provider for further evaluation and management of your symptoms as well as ongoing wellness visits.  I hope you feel better!     ED Prescriptions      Medication Sig Dispense Auth. Provider   azithromycin (ZITHROMAX) 250 MG tablet Take 1 tablet (250 mg total) by mouth daily. Take first 2 tablets together, then 1 every day until finished. 6 tablet Carlisle Beers, FNP      PDMP not reviewed this encounter.   Carlisle Beers, [place] 06/15/22 1126

## 2022-06-18 ENCOUNTER — Other Ambulatory Visit: Payer: Self-pay | Admitting: Internal Medicine

## 2022-07-09 NOTE — Progress Notes (Signed)
Remote pacemaker transmission.   

## 2022-07-31 DIAGNOSIS — F419 Anxiety disorder, unspecified: Secondary | ICD-10-CM | POA: Diagnosis not present

## 2022-07-31 DIAGNOSIS — F325 Major depressive disorder, single episode, in full remission: Secondary | ICD-10-CM | POA: Diagnosis not present

## 2022-07-31 DIAGNOSIS — H269 Unspecified cataract: Secondary | ICD-10-CM | POA: Diagnosis not present

## 2022-07-31 DIAGNOSIS — K5900 Constipation, unspecified: Secondary | ICD-10-CM | POA: Diagnosis not present

## 2022-07-31 DIAGNOSIS — M1990 Unspecified osteoarthritis, unspecified site: Secondary | ICD-10-CM | POA: Diagnosis not present

## 2022-07-31 DIAGNOSIS — I10 Essential (primary) hypertension: Secondary | ICD-10-CM | POA: Diagnosis not present

## 2022-07-31 DIAGNOSIS — E785 Hyperlipidemia, unspecified: Secondary | ICD-10-CM | POA: Diagnosis not present

## 2022-07-31 DIAGNOSIS — M8580 Other specified disorders of bone density and structure, unspecified site: Secondary | ICD-10-CM | POA: Diagnosis not present

## 2022-07-31 DIAGNOSIS — R32 Unspecified urinary incontinence: Secondary | ICD-10-CM | POA: Diagnosis not present

## 2022-08-13 DIAGNOSIS — H52203 Unspecified astigmatism, bilateral: Secondary | ICD-10-CM | POA: Diagnosis not present

## 2022-08-13 DIAGNOSIS — H5202 Hypermetropia, left eye: Secondary | ICD-10-CM | POA: Diagnosis not present

## 2022-08-13 DIAGNOSIS — Z961 Presence of intraocular lens: Secondary | ICD-10-CM | POA: Diagnosis not present

## 2022-08-13 DIAGNOSIS — H5211 Myopia, right eye: Secondary | ICD-10-CM | POA: Diagnosis not present

## 2022-08-13 DIAGNOSIS — H524 Presbyopia: Secondary | ICD-10-CM | POA: Diagnosis not present

## 2022-08-13 DIAGNOSIS — H2000 Unspecified acute and subacute iridocyclitis: Secondary | ICD-10-CM | POA: Diagnosis not present

## 2022-09-03 ENCOUNTER — Telehealth: Payer: Self-pay | Admitting: Cardiovascular Disease

## 2022-09-03 NOTE — Telephone Encounter (Signed)
Patient is going on vacation and is calling our office to see if she needs to take he monitor with her. Please advise

## 2022-09-04 NOTE — Telephone Encounter (Signed)
Pt is calling to f/u on receiving a callback to find out if she needs to take her monitor with her on vacation. She stated she was supposed to get a callback yesterday when she called the first time but she never heard anything so with tomorrow being a holiday, she'd like a callback as soon as possible. Please advise

## 2022-09-04 NOTE — Telephone Encounter (Signed)
Spoke with patient informed her that she could leave her monitor at home she is going to be gone less than a week.

## 2022-09-06 ENCOUNTER — Ambulatory Visit (INDEPENDENT_AMBULATORY_CARE_PROVIDER_SITE_OTHER): Payer: Medicare PPO

## 2022-09-06 DIAGNOSIS — I441 Atrioventricular block, second degree: Secondary | ICD-10-CM | POA: Diagnosis not present

## 2022-09-06 LAB — CUP PACEART REMOTE DEVICE CHECK
Battery Remaining Longevity: 27 mo
Battery Voltage: 2.93 V
Brady Statistic AP VP Percent: 67.16 %
Brady Statistic AP VS Percent: 0.01 %
Brady Statistic AS VP Percent: 32.74 %
Brady Statistic AS VS Percent: 0.09 %
Brady Statistic RA Percent Paced: 67.1 %
Brady Statistic RV Percent Paced: 99.9 %
Date Time Interrogation Session: 20240704215259
Implantable Lead Connection Status: 753985
Implantable Lead Connection Status: 753985
Implantable Lead Implant Date: 20190923
Implantable Lead Implant Date: 20190923
Implantable Lead Location: 753859
Implantable Lead Location: 753860
Implantable Lead Model: 3830
Implantable Lead Model: 5076
Implantable Pulse Generator Implant Date: 20190923
Lead Channel Impedance Value: 342 Ohm
Lead Channel Impedance Value: 342 Ohm
Lead Channel Impedance Value: 380 Ohm
Lead Channel Impedance Value: 418 Ohm
Lead Channel Pacing Threshold Amplitude: 0.5 V
Lead Channel Pacing Threshold Amplitude: 1.625 V
Lead Channel Pacing Threshold Pulse Width: 0.4 ms
Lead Channel Pacing Threshold Pulse Width: 0.4 ms
Lead Channel Sensing Intrinsic Amplitude: 1.25 mV
Lead Channel Sensing Intrinsic Amplitude: 1.25 mV
Lead Channel Sensing Intrinsic Amplitude: 9.625 mV
Lead Channel Sensing Intrinsic Amplitude: 9.625 mV
Lead Channel Setting Pacing Amplitude: 1.5 V
Lead Channel Setting Pacing Amplitude: 2.5 V
Lead Channel Setting Pacing Pulse Width: 1 ms
Lead Channel Setting Sensing Sensitivity: 1.2 mV
Zone Setting Status: 755011
Zone Setting Status: 755011

## 2022-09-23 NOTE — Progress Notes (Signed)
Remote pacemaker transmission.   

## 2022-09-24 DIAGNOSIS — Z1231 Encounter for screening mammogram for malignant neoplasm of breast: Secondary | ICD-10-CM | POA: Diagnosis not present

## 2022-10-29 DIAGNOSIS — I272 Pulmonary hypertension, unspecified: Secondary | ICD-10-CM | POA: Diagnosis not present

## 2022-10-29 DIAGNOSIS — E785 Hyperlipidemia, unspecified: Secondary | ICD-10-CM | POA: Diagnosis not present

## 2022-10-29 DIAGNOSIS — E876 Hypokalemia: Secondary | ICD-10-CM | POA: Diagnosis not present

## 2022-10-29 DIAGNOSIS — I10 Essential (primary) hypertension: Secondary | ICD-10-CM | POA: Diagnosis not present

## 2022-10-29 DIAGNOSIS — K649 Unspecified hemorrhoids: Secondary | ICD-10-CM | POA: Diagnosis not present

## 2022-10-29 DIAGNOSIS — R7301 Impaired fasting glucose: Secondary | ICD-10-CM | POA: Diagnosis not present

## 2022-10-29 DIAGNOSIS — Z950 Presence of cardiac pacemaker: Secondary | ICD-10-CM | POA: Diagnosis not present

## 2022-10-29 DIAGNOSIS — M1990 Unspecified osteoarthritis, unspecified site: Secondary | ICD-10-CM | POA: Diagnosis not present

## 2022-11-05 DIAGNOSIS — H04123 Dry eye syndrome of bilateral lacrimal glands: Secondary | ICD-10-CM | POA: Diagnosis not present

## 2022-11-05 DIAGNOSIS — H35033 Hypertensive retinopathy, bilateral: Secondary | ICD-10-CM | POA: Diagnosis not present

## 2022-11-05 DIAGNOSIS — Z961 Presence of intraocular lens: Secondary | ICD-10-CM | POA: Diagnosis not present

## 2022-11-07 ENCOUNTER — Ambulatory Visit: Payer: Medicare PPO | Admitting: Orthopaedic Surgery

## 2022-11-07 ENCOUNTER — Encounter: Payer: Self-pay | Admitting: Orthopaedic Surgery

## 2022-11-07 ENCOUNTER — Other Ambulatory Visit (INDEPENDENT_AMBULATORY_CARE_PROVIDER_SITE_OTHER): Payer: Medicare PPO

## 2022-11-07 DIAGNOSIS — M25552 Pain in left hip: Secondary | ICD-10-CM

## 2022-11-07 MED ORDER — PREDNISONE 10 MG (21) PO TBPK: ORAL_TABLET | ORAL | 3 refills | Status: DC

## 2022-11-07 NOTE — Progress Notes (Signed)
Office Visit Note   Patient: Lauren Holloway           Date of Birth: [birth year]           MRN: [id number] Visit Date: 11/07/2022              Requested by: Rodrigo Ran, MD [address] [place],  [place] [address] PCP: Rodrigo Ran, MD   Assessment & Plan: Visit Diagnoses:  1. Pain in left hip     Plan: Impression is low back pain and facet disease.  Could have a component of radiculopathy.  I will send in a steroid pack and make referral to outpatient PT.  Follow-up if symptoms do not improve after 6 weeks.  Follow-Up Instructions: No follow-ups on file.   Orders:  Orders Placed This Encounter  Procedures   XR Lumbar Spine 2-3 Views   XR Pelvis 1-2 Views   Ambulatory referral to Physical Therapy   Meds ordered this encounter  Medications   predniSONE (STERAPRED UNI-PAK 21 TAB) 10 MG (21) TBPK tablet    Sig: Take as directed    Dispense:  21 tablet    Refill:  3      Procedures: No procedures performed   Clinical Data: No additional findings.   Subjective: Chief Complaint  Patient presents with   Left Hip - Pain    HPI Lauren Holloway is a very pleasant 89 year old female who comes in for left lower back pain for about 2 months.  Denies any injuries or changes in activity.  Has started to notice some symptoms traveling down the left leg.  Denies any radicular symptoms.  Denies any groin pain. Review of Systems  Constitutional: Negative.   HENT: Negative.    Eyes: Negative.   Respiratory: Negative.    Cardiovascular: Negative.   Endocrine: Negative.   Musculoskeletal: Negative.   Neurological: Negative.   Hematological: Negative.   Psychiatric/Behavioral: Negative.    All other systems reviewed and are negative.    Objective: Vital Signs: There were no vitals taken for this visit.  Physical Exam Vitals and nursing note reviewed.  Constitutional:      Appearance: She is well-developed.  HENT:     Head: Atraumatic.     Nose: Nose normal.  Eyes:      Extraocular Movements: Extraocular movements intact.  Cardiovascular:     Pulses: Normal pulses.  Pulmonary:     Effort: Pulmonary effort is normal.  Abdominal:     Palpations: Abdomen is soft.  Musculoskeletal:     Cervical back: Neck supple.  Skin:    General: Skin is warm.     Capillary Refill: Capillary refill takes less than 2 seconds.  Neurological:     Mental Status: She is alert. Mental status is at baseline.  Psychiatric:        Behavior: Behavior normal.        Thought Content: Thought content normal.        Judgment: Judgment normal.     Ortho Exam Examination of the left hip is unremarkable.  Movement of the hip joint creates pain in the low back region. Specialty Comments:  No specialty comments available.  Imaging: XR Lumbar Spine 2-3 Views  Result Date: 11/07/2022 X-rays demonstrate preserved lumbar lordosis.  Facet disease is present throughout the lumbar spine.  XR Pelvis 1-2 Views  Result Date: 11/07/2022 X-rays demonstrate no acute or structural abnormalities of the hip joints.    PMFS History: Patient Active Problem List  Diagnosis Date Noted   Syncope 10/11/2020   Pacemaker 10/11/2020   AV heart block 11/20/2017   Pain in right hip 10/17/2017   Chronic left shoulder pain 03/17/2017   Grade 2 ankle sprain, unspecified laterality, subsequent encounter 05/28/2016   Dyspnea 10/25/2013   Hypertension 05/18/2010   ABDOMINAL PAIN RIGHT LOWER QUADRANT 05/18/2010   PERSONAL HISTORY OF COLONIC POLYPS 05/18/2010   Past Medical History:  Diagnosis Date   Cataract    Hyperlipidemia    Hypertension     Family History  Problem Relation Age of Onset   Ovarian cancer Mother    Hypertension Mother    Hypertension Father    Heart disease Father    Hypertension Sister    Hypertension Sister     Past Surgical History:  Procedure Laterality Date   ABDOMINAL HYSTERECTOMY     APPENDECTOMY     BILATERAL OOPHORECTOMY     CATARACT EXTRACTION, BILATERAL  Bilateral 1996   PACEMAKER IMPLANT N/A 11/24/2017   Procedure: PACEMAKER IMPLANT;  Surgeon: Marinus Maw, MD;  Location: [facility];  Service: Cardiovascular;  Laterality: N/A;   Social History   Occupational History   Not on file  Tobacco Use   Smoking status: Never   Smokeless tobacco: Never  Vaping Use   Vaping status: Never Used  Substance and Sexual Activity   Alcohol use: Yes    Comment: MIX OCCASIONALLY   Drug use: No   Sexual activity: Not on file

## 2022-11-11 DIAGNOSIS — J45909 Unspecified asthma, uncomplicated: Secondary | ICD-10-CM | POA: Diagnosis not present

## 2022-11-11 DIAGNOSIS — R0981 Nasal congestion: Secondary | ICD-10-CM | POA: Diagnosis not present

## 2022-11-11 DIAGNOSIS — R059 Cough, unspecified: Secondary | ICD-10-CM | POA: Diagnosis not present

## 2022-11-11 DIAGNOSIS — J069 Acute upper respiratory infection, unspecified: Secondary | ICD-10-CM | POA: Diagnosis not present

## 2022-11-11 DIAGNOSIS — Z1152 Encounter for screening for COVID-19: Secondary | ICD-10-CM | POA: Diagnosis not present

## 2022-11-14 ENCOUNTER — Ambulatory Visit: Payer: Medicare PPO | Admitting: Rehabilitative and Restorative Service Providers"

## 2022-11-28 ENCOUNTER — Ambulatory Visit: Payer: Medicare PPO | Admitting: Rehabilitative and Restorative Service Providers"

## 2022-11-28 ENCOUNTER — Encounter: Payer: Self-pay | Admitting: Rehabilitative and Restorative Service Providers"

## 2022-11-28 DIAGNOSIS — M5459 Other low back pain: Secondary | ICD-10-CM | POA: Diagnosis not present

## 2022-11-28 DIAGNOSIS — R293 Abnormal posture: Secondary | ICD-10-CM | POA: Diagnosis not present

## 2022-11-28 NOTE — Progress Notes (Signed)
OUTPATIENT PHYSICAL THERAPY THORACOLUMBAR EVALUATION  Referring diagnosis?  Diagnosis  M25.552 (ICD-10-CM) - Pain in left hip   Treatment diagnosis? (if different than referring diagnosis) R29.3   M54.59 What was this (referring dx) caused by? []  Surgery []  Fall []  Ongoing issue [x]  Arthritis []  Other: ____________  Laterality: []  Rt [x]  Lt []  Both  Check all possible CPT codes:  *CHOOSE 10 OR LESS*    []  97110 (Therapeutic Exercise)  []  92507 (SLP Treatment)  []  97112 (Neuro Re-ed)   []  92526 (Swallowing Treatment)   []  97116 (Gait Training)   []  81191 (Cognitive Training, 1st 15 minutes) []  97140 (Manual Therapy)   []  97130 (Cognitive Training, each add'l 15 minutes)  []  97164 (Re-evaluation)                              []  Other, List CPT Code ____________  []  97530 (Therapeutic Activities)     []  97535 (Self Care)   [x]  All codes above (97110 - 97535)  []  97012 (Mechanical Traction)  []  97014 (E-stim Unattended)  []  97032 (E-stim manual)  []  97033 (Ionto)  []  97035 (Ultrasound) []  97750 (Physical Performance Training) []  [id number] (Aquatic Therapy) []  97016 (Vasopneumatic Device) []  [id number] (Paraffin) []  97034 (Contrast Bath) []  97597 (Wound Care 1st 20 sq cm) []  97598 (Wound Care each add'l 20 sq cm) []  97760 (Orthotic Fabrication, Fitting, Training Initial) []  [id number] (Prosthetic Management and Training Initial) []  [id number] (Orthotic or Prosthetic Training/ Modification Subsequent)   Patient Name: Lauren Holloway MRN: [id number] DOB:[birth year], 89 y.o., female Today's Date: 11/28/2022  END OF SESSION:  PT End of Session - 11/28/22 1332     Visit Number 1    Number of Visits 12    Date for PT Re-Evaluation 01/23/23    Authorization Type [employer]    Authorization Time Period Through 01/23/2023    Authorization - Visit Number 1    Authorization - Number of Visits 12    Progress Note Due on Visit 12    PT Start Time 1058    PT Stop Time 1146    PT Time Calculation  (min) 48 min    Activity Tolerance Patient tolerated treatment well;No increased pain    Behavior During Therapy WFL for tasks assessed/performed             Past Medical History:  Diagnosis Date   Cataract    Hyperlipidemia    Hypertension    Past Surgical History:  Procedure Laterality Date   ABDOMINAL HYSTERECTOMY     APPENDECTOMY     BILATERAL OOPHORECTOMY     CATARACT EXTRACTION, BILATERAL Bilateral 1996   PACEMAKER IMPLANT N/A 11/24/2017   Procedure: PACEMAKER IMPLANT;  Surgeon: Marinus Maw, MD;  Location: [facility];  Service: Cardiovascular;  Laterality: N/A;   Patient Active Problem List   Diagnosis Date Noted   Syncope 10/11/2020   Pacemaker 10/11/2020   AV heart block 11/20/2017   Pain in right hip 10/17/2017   Chronic left shoulder pain 03/17/2017   Grade 2 ankle sprain, unspecified laterality, subsequent encounter 05/28/2016   Dyspnea 10/25/2013   Hypertension 05/18/2010   ABDOMINAL PAIN RIGHT LOWER QUADRANT 05/18/2010   PERSONAL HISTORY OF COLONIC POLYPS 05/18/2010    PCP: Rodrigo Ran, MD  REFERRING PROVIDER: Tarry Kos, MD  REFERRING DIAG:  Diagnosis  M25.552 (ICD-10-CM) - Pain in left hip    Rationale for Evaluation and Treatment:  Rehabilitation  THERAPY DIAG:  Abnormal posture - Plan: PT plan of care cert/re-cert  Other low back pain - Plan: PT plan of care cert/re-cert  ONSET DATE: 2-3 months   SUBJECTIVE:                                                                                                                                                                                           SUBJECTIVE STATEMENT: Lauren Holloway notes notes left sided low back pain, particularly when wearing a shoe with a heel, sitting or bending.  She has pain at night that wakes her up most nights.  The pain at night has recently started.  PERTINENT HISTORY:  HLD, HTN, pacemaker  PAIN:  Are you having pain? Yes: NPRS scale: 0-5/10 Pain location:  Left sided low back Pain description: Achy, stiff Aggravating factors: Weaning heels, prolonged sitting, bending and pain at night Relieving factors: Tylenol arthritis and the hemp rub  PRECAUTIONS: Back  RED FLAGS: None   WEIGHT BEARING RESTRICTIONS: No  FALLS:  Has patient fallen in last 6 months? No  LIVING ENVIRONMENT: Lives with: lives with their family and lives with their daughter Lives in: House/apartment Stairs:  More comfortable with a hand rail but can do Has following equipment at home: None  OCCUPATION: Retired  PLOF: Independent  PATIENT GOALS: Vacuuming, mopping, gardening  NEXT MD VISIT: NA  OBJECTIVE:   DIAGNOSTIC FINDINGS:  X-rays demonstrate no acute or structural abnormalities of the hip joints.  X-rays demonstrate preserved lumbar lordosis.  Facet disease is present  throughout the lumbar spine.  PATIENT SURVEYS:  FOTO 64 (risk-adjusted 57, Goal 68 in 11 visits)  SCREENING FOR RED FLAGS: Bowel or bladder incontinence: No Spinal tumors: No Cauda equina syndrome: No Compression fracture: No  COGNITION: Overall cognitive status: Within functional limits for tasks assessed     SENSATION: No complaints of peripheral pain or paresthesias  MUSCLE LENGTH: Hamstrings: Right 30 deg; Left 35 deg  POSTURE: rounded shoulders, forward head, and decreased lumbar lordosis  LUMBAR ROM:   AROM 11/28/2022  Flexion   Extension 5  Right lateral flexion   Left lateral flexion   Right rotation   Left rotation    (Blank rows = not tested)  LOWER EXTREMITY ROM:     Passive  Left/Right 11/28/2022   Hip flexion 85/80   Hip extension    Hip abduction    Hip adduction    Hip internal rotation 12/8   Hip external rotation 10/23   Knee flexion    Knee extension    Ankle dorsiflexion    Ankle plantarflexion    Ankle inversion  Ankle eversion     (Blank rows = not tested)  LOWER EXTREMITY STRENGTH:  Deferred at evaluation due to time  MMT  Left/Right 11/28/2022   Hip flexion    Hip extension    Hip abduction    Hip adduction    Hip internal rotation    Hip external rotation    Knee flexion    Knee extension    Ankle dorsiflexion    Ankle plantarflexion    Ankle inversion    Ankle eversion     (Blank rows = not tested)  GAIT: Distance walked: 50 feet Assistive device utilized: None Level of assistance: Complete Independence Comments: Slight flexed posture  TODAY'S TREATMENT:                                                                                                                              DATE: 11/28/2022  Single knee to chest stretch 2 x 20 seconds bilateral Supine hamstrings stretch 2 x 20 seconds bilateral Lumbar extension AROM 10 x 3 seconds Shoulder blade pinches 10 x 5 seconds  Functional Activities: Reviewed spine anatomy with spine model, logroll, correct mechanics for vacuuming and mopping, basic postural and body mechanics education, reviewed findings of today's examination and her starter home exercise program  PATIENT EDUCATION:  Education details: See above Person educated: Patient Education method: Explanation, Demonstration, Tactile cues, Verbal cues, and Handouts Education comprehension: verbalized understanding, returned demonstration, verbal cues required, tactile cues required, and needs further education  HOME EXERCISE PROGRAM: [email or web address]  ASSESSMENT:  CLINICAL IMPRESSION: Patient is a 89  y.o. female who was seen today for physical therapy evaluation and treatment for  low back pain and facet disease.  Jeilin mentions a 2 to 31-month history of left-sided low back pain that is worst with sitting, wearing heels and with flexed activities.  Limited lumbar extension AROM, limited hip flexibility and postural impairments were noted at evaluation.  We did spend quite a bit of time on education today along with her starter home exercise program.  It is prognosis to meet the below listed  goals is good with the recommended plan of care.  OBJECTIVE IMPAIRMENTS:decreased activity tolerance, decreased endurance, decreased knowledge of condition, decreased ROM, decreased strength, decreased safety awareness, impaired perceived functional ability, increased muscle spasms, impaired flexibility, improper body mechanics, postural dysfunction, and pain.   ACTIVITY LIMITATIONS: carrying, lifting, bending, sitting, sleeping, and bed mobility  PARTICIPATION LIMITATIONS: cleaning and community activity  PERSONAL FACTORS: HLD, HTN, pacemaker are also affecting patient's functional outcome.   REHAB POTENTIAL: Good  CLINICAL DECISION MAKING: Stable/uncomplicated  EVALUATION COMPLEXITY: Low   GOALS: Goals reviewed with patient? Yes  SHORT TERM GOALS: Target date: 12/26/2022  Brentney will be independent with her day 1 HEP. Baseline: Started 11/28/2022 Goal status: INITIAL  2.  Improve lumbar extension AROM to 10 degrees. Baseline: 5 degrees Goal status: INITIAL  3.  Adylyn will have improved posture and body mechanics awareness and  will be able to implement these into daily activities. Baseline: Started education 11/28/2022 Goal status: INITIAL  LONG TERM GOALS: Target date: 01/23/2023  Improve FOTO to 68 in 11 visits. Baseline: 64, risk-adjusted 57 Goal status: INITIAL  2.  Improve left side low back pain to consistently 0-2/10 on the Numeric Pain Rating Scale. Baseline: 0-5/10 Goal status: INITIAL  3.  Improve bilateral lower extremity flexibility for hip flexors to 100 degrees; hamstrings to 45 degrees and hip external rotation to 40 degrees Baseline: (Lt/Rt in degrees): 85/80; 35/30 and 10/23 respectively Goal status: INITIAL  4.  Lennice will be able to sit for 30 or more minutes with correct posture and lumbar support without having to move or change position due to pain Baseline: Limited sitting endurance due to pain Goal status: INITIAL  5.  Kinsee will be  independent with a long-term home exercise program at discharge Baseline: Started 11/28/2022 Goal status: INITIAL   PLAN:  PT FREQUENCY: 1-2x/week  PT DURATION: 8 weeks  PLANNED INTERVENTIONS: Therapeutic exercises, Therapeutic activity, Neuromuscular re-education, Balance training, Gait training, Patient/Family education, Self Care, Stair training, Dry Needling, Cryotherapy, and Manual therapy.  PLAN FOR NEXT SESSION: Review day 1 home exercise program, Janal requested a balance screen so complete BERG balance scale on visit 2.  Consider figure 4 stretch and beginning spine strength exercises.   Cherlyn Cushing, PT, MPT 11/28/2022, 1:34 PM

## 2022-12-04 ENCOUNTER — Other Ambulatory Visit: Payer: Self-pay | Admitting: Internal Medicine

## 2022-12-09 ENCOUNTER — Ambulatory Visit (INDEPENDENT_AMBULATORY_CARE_PROVIDER_SITE_OTHER): Payer: Medicare PPO

## 2022-12-09 DIAGNOSIS — I441 Atrioventricular block, second degree: Secondary | ICD-10-CM

## 2022-12-09 LAB — CUP PACEART REMOTE DEVICE CHECK
Battery Remaining Longevity: 25 mo
Battery Voltage: 2.92 V
Brady Statistic AP VP Percent: 71.82 %
Brady Statistic AP VS Percent: 0.02 %
Brady Statistic AS VP Percent: 28.09 %
Brady Statistic AS VS Percent: 0.07 %
Brady Statistic RA Percent Paced: 71.86 %
Brady Statistic RV Percent Paced: 99.91 %
Date Time Interrogation Session: 20241007020221
Implantable Lead Connection Status: 753985
Implantable Lead Connection Status: 753985
Implantable Lead Implant Date: 20190923
Implantable Lead Implant Date: 20190923
Implantable Lead Location: 753859
Implantable Lead Location: 753860
Implantable Lead Model: 3830
Implantable Lead Model: 5076
Implantable Pulse Generator Implant Date: 20190923
Lead Channel Impedance Value: 342 Ohm
Lead Channel Impedance Value: 342 Ohm
Lead Channel Impedance Value: 380 Ohm
Lead Channel Impedance Value: 418 Ohm
Lead Channel Pacing Threshold Amplitude: 0.5 V
Lead Channel Pacing Threshold Amplitude: 1.625 V
Lead Channel Pacing Threshold Pulse Width: 0.4 ms
Lead Channel Pacing Threshold Pulse Width: 0.4 ms
Lead Channel Sensing Intrinsic Amplitude: 1 mV
Lead Channel Sensing Intrinsic Amplitude: 1 mV
Lead Channel Sensing Intrinsic Amplitude: 8.375 mV
Lead Channel Sensing Intrinsic Amplitude: 8.375 mV
Lead Channel Setting Pacing Amplitude: 1.5 V
Lead Channel Setting Pacing Amplitude: 2.5 V
Lead Channel Setting Pacing Pulse Width: 1 ms
Lead Channel Setting Sensing Sensitivity: 1.2 mV
Zone Setting Status: 755011
Zone Setting Status: 755011

## 2022-12-10 DIAGNOSIS — H6123 Impacted cerumen, bilateral: Secondary | ICD-10-CM | POA: Diagnosis not present

## 2022-12-10 DIAGNOSIS — H9202 Otalgia, left ear: Secondary | ICD-10-CM | POA: Diagnosis not present

## 2022-12-10 DIAGNOSIS — H9012 Conductive hearing loss, unilateral, left ear, with unrestricted hearing on the contralateral side: Secondary | ICD-10-CM | POA: Diagnosis not present

## 2022-12-12 ENCOUNTER — Encounter: Payer: Medicare PPO | Admitting: Rehabilitative and Restorative Service Providers"

## 2022-12-18 ENCOUNTER — Ambulatory Visit: Payer: Medicare PPO | Admitting: Rehabilitative and Restorative Service Providers"

## 2022-12-18 ENCOUNTER — Encounter: Payer: Self-pay | Admitting: Rehabilitative and Restorative Service Providers"

## 2022-12-18 DIAGNOSIS — R293 Abnormal posture: Secondary | ICD-10-CM

## 2022-12-18 DIAGNOSIS — M5459 Other low back pain: Secondary | ICD-10-CM | POA: Diagnosis not present

## 2022-12-18 NOTE — Therapy (Signed)
OUTPATIENT PHYSICAL THERAPY TREATMENT NOTE   Patient Name: Lauren Holloway MRN: [id number] DOB:1931-08-29, 89 y.o., female Today's Date: 12/18/2022  END OF SESSION:   PT End of Session - 12/18/22 1023     Visit Number 2    Number of Visits 12    Date for PT Re-Evaluation 01/23/23    Authorization Type [employer]    Authorization Time Period Through 01/23/2023    Authorization - Visit Number 2    Authorization - Number of Visits 12    Progress Note Due on Visit 12    PT Start Time 1012    PT Stop Time 1052    PT Time Calculation (min) 40 min    Activity Tolerance Patient tolerated treatment well    Behavior During Therapy WFL for tasks assessed/performed             Past Medical History:  Diagnosis Date   Cataract    Hyperlipidemia    Hypertension    Past Surgical History:  Procedure Laterality Date   ABDOMINAL HYSTERECTOMY     APPENDECTOMY     BILATERAL OOPHORECTOMY     CATARACT EXTRACTION, BILATERAL Bilateral 1996   PACEMAKER IMPLANT N/A 11/24/2017   Procedure: PACEMAKER IMPLANT;  Surgeon: Marinus Maw, MD;  Location: [facility];  Service: Cardiovascular;  Laterality: N/A;   Patient Active Problem List   Diagnosis Date Noted   Syncope 10/11/2020   Pacemaker 10/11/2020   AV heart block 11/20/2017   Pain in right hip 10/17/2017   Chronic left shoulder pain 03/17/2017   Grade 2 ankle sprain, unspecified laterality, subsequent encounter 05/28/2016   Dyspnea 10/25/2013   Hypertension 05/18/2010   ABDOMINAL PAIN RIGHT LOWER QUADRANT 05/18/2010   History of colonic polyps 05/18/2010     THERAPY DIAG:  Abnormal posture  Other low back pain   PCP: Rodrigo Ran, MD   REFERRING PROVIDER: Tarry Kos, MD   REFERRING DIAG:    [phone] (ICD-10-CM) - Pain in left hip      Rationale for Evaluation and Treatment: Rehabilitation   THERAPY DIAG:  Abnormal posture - Plan: PT plan of care cert/re-cert   Other low back pain - Plan: PT plan of care  cert/re-cert   ONSET DATE: 2-3 months    SUBJECTIVE:                                                                                                                                                                                            SUBJECTIVE STATEMENT: Pt indicated doing better overall.  Reported waking up one night with pain.   Pt indicated  during the day feeling ok.  Pt indicated being still in static positioning can still be aggravating.    PERTINENT HISTORY:  HLD, HTN, pacemaker   PAIN:  PRS scale: up to 8/10 but short term.  Pain location: Left sided low back Pain description: Achy, stiff Aggravating factors: prolonged static posiitoning Relieving factors: Tylenol arthritis and the hemp rub   PRECAUTIONS: Back   RED FLAGS: None      WEIGHT BEARING RESTRICTIONS: No   FALLS:  Has patient fallen in last 6 months? No   LIVING ENVIRONMENT: Lives with: lives with their family and lives with their daughter Lives in: House/apartment Stairs:  More comfortable with a hand rail but can do Has following equipment at home: None   OCCUPATION: Retired   PLOF: Independent   PATIENT GOALS: Vacuuming, mopping, gardening   NEXT MD VISIT: NA   OBJECTIVE:    DIAGNOSTIC FINDINGS:  11/28/2022 review of chart: X-rays demonstrate no acute or structural abnormalities of the hip joints.   X-rays demonstrate preserved lumbar lordosis.  Facet disease is present  throughout the lumbar spine.   PATIENT SURVEYS:  11/28/2022 FOTO 64 (risk-adjusted 57, Goal 68 in 11 visits)   SCREENING FOR RED FLAGS: 11/28/2022 Bowel or bladder incontinence: No Spinal tumors: No Cauda equina syndrome: No Compression fracture: No   COGNITION: 11/28/2022 Overall cognitive status: Within functional limits for tasks assessed                          SENSATION: 11/28/2022 No complaints of peripheral pain or paresthesias   MUSCLE LENGTH: 11/28/2022 Hamstrings: Right 30 deg; Left 35 deg    POSTURE:  11/28/2022 rounded shoulders, forward head, and decreased lumbar lordosis   LUMBAR ROM:    AROM 11/28/2022 12/18/2022  Flexion     Extension 5 50% WFL with tightness reported.   Right lateral flexion     Left lateral flexion     Right rotation     Left rotation      (Blank rows = not tested)   LOWER EXTREMITY ROM:      Passive  Left/Right 11/28/2022    Hip flexion 85/80    Hip extension      Hip abduction      Hip adduction      Hip internal rotation 12/8    Hip external rotation 10/23    Knee flexion      Knee extension      Ankle dorsiflexion      Ankle plantarflexion      Ankle inversion      Ankle eversion       (Blank rows = not tested)   LOWER EXTREMITY STRENGTH:  11/28/2022 Deferred at evaluation due to time   MMT Left/Right 11/28/2022    Hip flexion      Hip extension      Hip abduction      Hip adduction      Hip internal rotation      Hip external rotation      Knee flexion      Knee extension      Ankle dorsiflexion      Ankle plantarflexion      Ankle inversion      Ankle eversion       (Blank rows = not tested)   BALANCE  12/18/22 0001  Balance  Balance Assessed Yes  Standardized Balance Assessment  Standardized Balance Assessment Berg Balance Test  Sharlene Motts  Balance Test  Sit to Stand 4  Standing Unsupported 4  Sitting with Back Unsupported but Feet Supported on Floor or Stool 4  Stand to Sit 4  Transfers 4  Standing Unsupported with Eyes Closed 4  Standing Unsupported with Feet Together 4  From Standing, Reach Forward with Outstretched Arm 4  From Standing Position, Pick up Object from Floor 4  From Standing Position, Turn to Look Behind Over each Shoulder 4  Turn 360 Degrees 4  Standing Unsupported, Alternately Place Feet on Step/Stool 4  Standing Unsupported, One Foot in Front 3  Standing on One Leg 3  Total Score 54    GAIT: 11/28/2022 Distance walked: 50 feet Assistive device utilized: None Level of assistance: Complete  Independence Comments: Slight flexed posture               TODAY'S TREATMENT:                                                             DATE: 12/18/2022 Therex: Nustep Lvl 5 8 mins UE/LE with instruction for aerobic exercise in home program Sit to stand to sit with cues for home use 18 inch chair x 11 Standing lumbar extension AROM x 5 Supine lumbar trunk rotation stretch  15 sec x 3 bilateral Supine figure 4 push away 15 sec x 3 bilateral (limited by symptoms) Supine bridge x 10   Neuro Re-ed Alt toe tapping on 6 inch step x 5 bilateral c SBA Tandem stance 30 sec x 1 bilateral with SBA, cues for home use in corner Berg testing and performance    TODAY'S TREATMENT:                                                             DATE: 11/28/2022  Single knee to chest stretch 2 x 20 seconds bilateral Supine hamstrings stretch 2 x 20 seconds bilateral Lumbar extension AROM 10 x 3 seconds Shoulder blade pinches 10 x 5 seconds   Functional Activities: Reviewed spine anatomy with spine model, logroll, correct mechanics for vacuuming and mopping, basic postural and body mechanics education, reviewed findings of today's examination and her starter home exercise program   PATIENT EDUCATION:  12/18/2022 Education details: HEP update Person educated: Patient Education method: Programmer, multimedia, Demonstration, Verbal cues, demonstration, and Handouts Education comprehension: verbalized understanding, returned demonstration, verbal cues required   HOME EXERCISE PROGRAM: Access Code: [email or web address] URL: [email or web address] Date: 12/18/2022 Prepared by: Chyrel Masson  Exercises - Standing Lumbar Extension at Wall - Forearms  - 5 x daily - 7 x weekly - 1 sets - 5 reps - 3 seconds hold - Standing Scapular Retraction  - 5 x daily - 7 x weekly - 1 sets - 5 reps - 5 second hold - Single Knee to Chest Stretch  - 2-3 x daily - 7 x weekly - 1 sets - 5 reps - 20 seconds hold - Supine  Hamstring Stretch  - 2-3 x daily - 7 x weekly - 1 sets - 5 reps - 20 seconds hold - Sit to Stand  - 3 x daily -  7 x weekly - 1 sets - 10 reps - Tandem Stance in Corner  - 1 x daily - 7 x weekly - 1 sets - 3-5 reps - 30 hold   ASSESSMENT:   CLINICAL IMPRESSION: Generally positive report of symptoms since first visit.  Check of HEP knowledge was pretty good today with occasional cues.  BERG testing was 54/56 today showing good overall score.  Added tandem stance at home  and sit to stand strengthening. ER in supine FABER was limited as noted in figure 4 push away stretch.    OBJECTIVE IMPAIRMENTS:decreased activity tolerance, decreased endurance, decreased knowledge of condition, decreased ROM, decreased strength, decreased safety awareness, impaired perceived functional ability, increased muscle spasms, impaired flexibility, improper body mechanics, postural dysfunction, and pain.    ACTIVITY LIMITATIONS: carrying, lifting, bending, sitting, sleeping, and bed mobility   PARTICIPATION LIMITATIONS: cleaning and community activity   PERSONAL FACTORS: HLD, HTN, pacemaker are also affecting patient's functional outcome.    REHAB POTENTIAL: Good   CLINICAL DECISION MAKING: Stable/uncomplicated   EVALUATION COMPLEXITY: Low     GOALS: Goals reviewed with patient? Yes   SHORT TERM GOALS: Target date: 12/26/2022   Taylynn will be independent with her day 1 HEP. Baseline: Started 11/28/2022 Goal status: on going 12/18/2022   2.  Improve lumbar extension AROM to 10 degrees. Baseline: 5 degrees Goal status: on going 12/18/2022   3.  Harleyann will have improved posture and body mechanics awareness and will be able to implement these into daily activities. Baseline: Started education 11/28/2022 Goal status: on going 12/18/2022   LONG TERM GOALS: Target date: 01/23/2023   Improve FOTO to 68 in 11 visits. Baseline: 64, risk-adjusted 57 Goal status: INITIAL   2.  Improve left side low back pain  to consistently 0-2/10 on the Numeric Pain Rating Scale. Baseline: 0-5/10 Goal status: INITIAL   3.  Improve bilateral lower extremity flexibility for hip flexors to 100 degrees; hamstrings to 45 degrees and hip external rotation to 40 degrees Baseline: (Lt/Rt in degrees): 85/80; 35/30 and 10/23 respectively Goal status: INITIAL   4.  Christana will be able to sit for 30 or more minutes with correct posture and lumbar support without having to move or change position due to pain Baseline: Limited sitting endurance due to pain Goal status: INITIAL   5.  Semajah will be independent with a long-term home exercise program at discharge Baseline: Started 11/28/2022 Goal status: INITIAL     PLAN:   PT FREQUENCY: 1-2x/week   PT DURATION: 8 weeks   PLANNED INTERVENTIONS: Therapeutic exercises, Therapeutic activity, Neuromuscular re-education, Balance training, Gait training, Patient/Family education, Self Care, Stair training, Dry Needling, Cryotherapy, and Manual therapy.   PLAN FOR NEXT SESSION: Hip mobility improvements, general exercise progression for endurance.    Chyrel Masson, PT, DPT, OCS, ATC 12/18/22  10:52 AM

## 2022-12-20 ENCOUNTER — Ambulatory Visit: Payer: Medicare PPO | Admitting: Rehabilitative and Restorative Service Providers"

## 2022-12-20 ENCOUNTER — Encounter: Payer: Self-pay | Admitting: Rehabilitative and Restorative Service Providers"

## 2022-12-20 DIAGNOSIS — M5459 Other low back pain: Secondary | ICD-10-CM

## 2022-12-20 DIAGNOSIS — R293 Abnormal posture: Secondary | ICD-10-CM

## 2022-12-20 NOTE — Therapy (Signed)
OUTPATIENT PHYSICAL THERAPY TREATMENT NOTE   Patient Name: Lauren Holloway MRN: [id number] DOB:1931/04/24, 89 y.o., female Today's Date: 12/20/2022  END OF SESSION:   PT End of Session - 12/20/22 1004     Visit Number 3    Number of Visits 12    Date for PT Re-Evaluation 01/23/23    Authorization Type [employer]    Authorization Time Period Through 01/23/2023    Authorization - Visit Number 3    Authorization - Number of Visits 12    Progress Note Due on Visit 12    PT Start Time 1003    PT Stop Time 1050    PT Time Calculation (min) 47 min    Activity Tolerance Patient tolerated treatment well;No increased pain    Behavior During Therapy WFL for tasks assessed/performed              Past Medical History:  Diagnosis Date   Cataract    Hyperlipidemia    Hypertension    Past Surgical History:  Procedure Laterality Date   ABDOMINAL HYSTERECTOMY     APPENDECTOMY     BILATERAL OOPHORECTOMY     CATARACT EXTRACTION, BILATERAL Bilateral 1996   PACEMAKER IMPLANT N/A 11/24/2017   Procedure: PACEMAKER IMPLANT;  Surgeon: Marinus Maw, MD;  Location: [facility];  Service: Cardiovascular;  Laterality: N/A;   Patient Active Problem List   Diagnosis Date Noted   Syncope 10/11/2020   Pacemaker 10/11/2020   AV heart block 11/20/2017   Pain in right hip 10/17/2017   Chronic left shoulder pain 03/17/2017   Grade 2 ankle sprain, unspecified laterality, subsequent encounter 05/28/2016   Dyspnea 10/25/2013   Hypertension 05/18/2010   ABDOMINAL PAIN RIGHT LOWER QUADRANT 05/18/2010   History of colonic polyps 05/18/2010     THERAPY DIAG:  Abnormal posture  Other low back pain   PCP: Rodrigo Ran, MD   REFERRING PROVIDER: Tarry Kos, MD   REFERRING DIAG:    [phone] (ICD-10-CM) - Pain in left hip      Rationale for Evaluation and Treatment: Rehabilitation   THERAPY DIAG:  Abnormal posture - Plan: PT plan of care cert/re-cert   Other low back pain - Plan:  PT plan of care cert/re-cert   ONSET DATE: 2-3 months    SUBJECTIVE:                                                                                                                                                                                            SUBJECTIVE STATEMENT: Lauren Holloway notes progress with her left sided low back pain since starting PT.  Only 1 episode of > 3/10 pain (at night).  PERTINENT HISTORY:  HLD, HTN, pacemaker   PAIN:  PRS scale: 2-3/10 most of the time with a 1 x 6/10 at night Monday Pain location: Left sided low back Pain description: Achy, stiff Aggravating factors: Prolonged static posiitoning Relieving factors: Tylenol arthritis and the hemp rub   PRECAUTIONS: Back   RED FLAGS: None      WEIGHT BEARING RESTRICTIONS: No   FALLS:  Has patient fallen in last 6 months? No   LIVING ENVIRONMENT: Lives with: lives with their family and lives with their daughter Lives in: House/apartment Stairs:  More comfortable with a hand rail but can do Has following equipment at home: None   OCCUPATION: Retired   PLOF: Independent   PATIENT GOALS: Vacuuming, mopping, gardening   NEXT MD VISIT: NA   OBJECTIVE:    DIAGNOSTIC FINDINGS:  11/28/2022 review of chart: X-rays demonstrate no acute or structural abnormalities of the hip joints.   X-rays demonstrate preserved lumbar lordosis.  Facet disease is present  throughout the lumbar spine.   PATIENT SURVEYS:  11/28/2022 FOTO 64 (risk-adjusted 57, Goal 68 in 11 visits)   SCREENING FOR RED FLAGS: 11/28/2022 Bowel or bladder incontinence: No Spinal tumors: No Cauda equina syndrome: No Compression fracture: No   COGNITION: 11/28/2022 Overall cognitive status: Within functional limits for tasks assessed                          SENSATION: 11/28/2022 No complaints of peripheral pain or paresthesias   MUSCLE LENGTH: 11/28/2022 Hamstrings: Right 30 deg; Left 35 deg   POSTURE:  11/28/2022 rounded  shoulders, forward head, and decreased lumbar lordosis   LUMBAR ROM:    AROM 11/28/2022 12/18/2022  Flexion     Extension 5 50% WFL with tightness reported.   Right lateral flexion     Left lateral flexion     Right rotation     Left rotation      (Blank rows = not tested)   LOWER EXTREMITY ROM:      Passive  Left/Right 11/28/2022    Hip flexion 85/80    Hip extension      Hip abduction      Hip adduction      Hip internal rotation 12/8    Hip external rotation 10/23    Knee flexion      Knee extension      Ankle dorsiflexion      Ankle plantarflexion      Ankle inversion      Ankle eversion       (Blank rows = not tested)   LOWER EXTREMITY STRENGTH:  11/28/2022 Deferred at evaluation due to time   MMT Left/Right 11/28/2022    Hip flexion      Hip extension      Hip abduction      Hip adduction      Hip internal rotation      Hip external rotation      Knee flexion      Knee extension      Ankle dorsiflexion      Ankle plantarflexion      Ankle inversion      Ankle eversion       (Blank rows = not tested)   BALANCE  12/18/22 0001  Balance  Balance Assessed Yes  Standardized Balance Assessment  Standardized Balance Assessment Berg Balance Test  [facility] Balance Test  Sit  to Stand 4  Standing Unsupported 4  Sitting with Back Unsupported but Feet Supported on Floor or Stool 4  Stand to Sit 4  Transfers 4  Standing Unsupported with Eyes Closed 4  Standing Unsupported with Feet Together 4  From Standing, Reach Forward with Outstretched Arm 4  From Standing Position, Pick up Object from Floor 4  From Standing Position, Turn to Look Behind Over each Shoulder 4  Turn 360 Degrees 4  Standing Unsupported, Alternately Place Feet on Step/Stool 4  Standing Unsupported, One Foot in Front 3  Standing on One Leg 3  Total Score 54    GAIT: 11/28/2022 Distance walked: 50 feet Assistive device utilized: None Level of assistance: Complete Independence Comments: Slight  flexed posture               TODAY'S TREATMENT:                                                             DATE:  12/20/2022 Single knee to chest stretch 5 x 20 seconds bilateral Supine hamstrings stretch 5 x 20 seconds bilateral Figure 4 (push away) stretch 5 x 20 seconds Yoga Bridge 10 x 5 seconds Lumbar extension AROM 10 x 3 seconds Shoulder blade pinches 10 x 5 seconds  Neuromuscular re-education: Tandem balance 6 x 20 seconds (cues needed for step strategy)   Functional Activities: Reviewed logroll, basic postural and body mechanics education, reviewed home exercise program Sit to stand slow eccentrics 10 x 3 seconds    12/18/2022 Therex: Nustep Lvl 5 8 mins UE/LE with instruction for aerobic exercise in home program Sit to stand to sit with cues for home use 18 inch chair x 11 Standing lumbar extension AROM x 5 Supine lumbar trunk rotation stretch  15 sec x 3 bilateral Supine figure 4 push away 15 sec x 3 bilateral (limited by symptoms) Supine bridge x 10   Neuro Re-ed Alt toe tapping on 6 inch step x 5 bilateral c SBA Tandem stance 30 sec x 1 bilateral with SBA, cues for home use in corner Berg testing and performance   11/28/2022  Single knee to chest stretch 2 x 20 seconds bilateral Supine hamstrings stretch 2 x 20 seconds bilateral Lumbar extension AROM 10 x 3 seconds Shoulder blade pinches 10 x 5 seconds   Functional Activities: Reviewed spine anatomy with spine model, logroll, correct mechanics for vacuuming and mopping, basic postural and body mechanics education, reviewed findings of today's examination and her starter home exercise program   PATIENT EDUCATION:  12/18/2022 Education details: HEP update Person educated: Patient Education method: Programmer, multimedia, Demonstration, Verbal cues, demonstration, and Handouts Education comprehension: verbalized understanding, returned demonstration, verbal cues required   HOME EXERCISE PROGRAM: Access Code:  [email or web address] URL: [email or web address] Date: 12/20/2022 Prepared by: Pauletta Browns  Exercises - Standing Lumbar Extension at Wall - Forearms  - 5 x daily - 7 x weekly - 1 sets - 5 reps - 3 seconds hold - Standing Scapular Retraction  - 5 x daily - 7 x weekly - 1 sets - 5 reps - 5 second hold - Single Knee to Chest Stretch  - 2 x daily - 7 x weekly - 1 sets - 5 reps - 20 seconds hold - Supine Hamstring Stretch  - 2 x daily -  7 x weekly - 1 sets - 5 reps - 20 seconds hold - Sit to Stand  - 2 x daily - 7 x weekly - 1 sets - 10 reps - Tandem Stance in Corner  - 1 x daily - 7 x weekly - 1 sets - 3-5 reps - 30 hold - Supine Figure 4 Piriformis Stretch  - 2 x daily - 7 x weekly - 1 sets - 5 reps - 20 seconds hold - Yoga Bridge  - 2 x daily - 7 x weekly - 1 sets - 10 reps - 5 seconds hold   ASSESSMENT:   CLINICAL IMPRESSION: Lauren Holloway notes early subjective and functional progress with her physical therapy.  Added some strength and flexibility activities to her HEP with discussions of posture and body mechanics.  Objective progress will be assessed next visit to assess progress towards long-term goals.   OBJECTIVE IMPAIRMENTS:decreased activity tolerance, decreased endurance, decreased knowledge of condition, decreased ROM, decreased strength, decreased safety awareness, impaired perceived functional ability, increased muscle spasms, impaired flexibility, improper body mechanics, postural dysfunction, and pain.    ACTIVITY LIMITATIONS: carrying, lifting, bending, sitting, sleeping, and bed mobility   PARTICIPATION LIMITATIONS: cleaning and community activity   PERSONAL FACTORS: HLD, HTN, pacemaker are also affecting patient's functional outcome.    REHAB POTENTIAL: Good   CLINICAL DECISION MAKING: Stable/uncomplicated   EVALUATION COMPLEXITY: Low     GOALS: Goals reviewed with patient? Yes   SHORT TERM GOALS: Target date: 12/26/2022   Lauren Holloway will be independent with her day 1  HEP. Baseline: Started 11/28/2022 Goal status: Met 12/20/2022   2.  Improve lumbar extension AROM to 10 degrees. Baseline: 5 degrees Goal status: On going 12/18/2022   3.  Lauren Holloway will have improved posture and body mechanics awareness and will be able to implement these into daily activities. Baseline: Started education 11/28/2022 Goal status: On going 12/20/2022   LONG TERM GOALS: Target date: 01/23/2023   Improve FOTO to 68 in 11 visits. Baseline: 64, risk-adjusted 57 Goal status: INITIAL   2.  Improve left side low back pain to consistently 0-2/10 on the Numeric Pain Rating Scale. Baseline: 0-5/10 Goal status: On Going 12/20/2022   3.  Improve bilateral lower extremity flexibility for hip flexors to 100 degrees; hamstrings to 45 degrees and hip external rotation to 40 degrees Baseline: (Lt/Rt in degrees): 85/80; 35/30 and 10/23 respectively Goal status: INITIAL   4.  Lauren Holloway will be able to sit for 30 or more minutes with correct posture and lumbar support without having to move or change position due to pain Baseline: Limited sitting endurance due to pain Goal status: INITIAL   5.  Lauren Holloway will be independent with a long-term home exercise program at discharge Baseline: Started 11/28/2022 Goal status: INITIAL     PLAN:   PT FREQUENCY: 1-2x/week   PT DURATION: 8 weeks   PLANNED INTERVENTIONS: Therapeutic exercises, Therapeutic activity, Neuromuscular re-education, Balance training, Gait training, Patient/Family education, Self Care, Stair training, Dry Needling, Cryotherapy, and Manual therapy.   PLAN FOR NEXT SESSION: Objective measures, hip hike?   Cherlyn Cushing PT, MPT 12/20/22  10:58 AM

## 2022-12-23 NOTE — Progress Notes (Signed)
Remote pacemaker transmission.   

## 2022-12-25 ENCOUNTER — Encounter: Payer: Medicare PPO | Admitting: Rehabilitative and Restorative Service Providers"

## 2023-01-01 ENCOUNTER — Encounter: Payer: Self-pay | Admitting: Rehabilitative and Restorative Service Providers"

## 2023-01-01 ENCOUNTER — Ambulatory Visit: Payer: Medicare PPO | Admitting: Rehabilitative and Restorative Service Providers"

## 2023-01-01 DIAGNOSIS — R293 Abnormal posture: Secondary | ICD-10-CM

## 2023-01-01 DIAGNOSIS — M5459 Other low back pain: Secondary | ICD-10-CM | POA: Diagnosis not present

## 2023-01-01 NOTE — Therapy (Signed)
OUTPATIENT PHYSICAL THERAPY TREATMENT NOTE   Patient Name: Lauren Holloway MRN: [id number] DOB:March 23, 1931, 89 y.o., female Today's Date: 01/01/2023  END OF SESSION:   PT End of Session - 01/01/23 1517     Visit Number 4    Number of Visits 12    Date for PT Re-Evaluation 01/23/23    Authorization Type [employer]    Authorization Time Period Through 01/23/2023    Authorization - Visit Number 4    Authorization - Number of Visits 12    Progress Note Due on Visit 12    PT Start Time 1516    PT Stop Time 1601    PT Time Calculation (min) 45 min    Activity Tolerance Patient tolerated treatment well;No increased pain    Behavior During Therapy WFL for tasks assessed/performed             Past Medical History:  Diagnosis Date   Cataract    Hyperlipidemia    Hypertension    Past Surgical History:  Procedure Laterality Date   ABDOMINAL HYSTERECTOMY     APPENDECTOMY     BILATERAL OOPHORECTOMY     CATARACT EXTRACTION, BILATERAL Bilateral 1996   PACEMAKER IMPLANT N/A 11/24/2017   Procedure: PACEMAKER IMPLANT;  Surgeon: Marinus Maw, MD;  Location: [facility];  Service: Cardiovascular;  Laterality: N/A;   Patient Active Problem List   Diagnosis Date Noted   Syncope 10/11/2020   Pacemaker 10/11/2020   AV heart block 11/20/2017   Pain in right hip 10/17/2017   Chronic left shoulder pain 03/17/2017   Grade 2 ankle sprain, unspecified laterality, subsequent encounter 05/28/2016   Dyspnea 10/25/2013   Hypertension 05/18/2010   ABDOMINAL PAIN RIGHT LOWER QUADRANT 05/18/2010   History of colonic polyps 05/18/2010     THERAPY DIAG:  Abnormal posture  Other low back pain   PCP: Rodrigo Ran, MD   REFERRING PROVIDER: Tarry Kos, MD   REFERRING DIAG:    [id number] (ICD-10-CM) - Pain in left hip      Rationale for Evaluation and Treatment: Rehabilitation   THERAPY DIAG:  Abnormal posture - Plan: PT plan of care cert/re-cert   Other low back pain - Plan: PT  plan of care cert/re-cert   ONSET DATE: 2-3 months    SUBJECTIVE:                                                                                                                                                                                            SUBJECTIVE STATEMENT: Lauren Holloway notes continued progress with her left sided low back pain since starting PT.  Lauren Holloway still can get 5-6/10 pain (at night) after activities with too much bending (like gardening).  PERTINENT HISTORY:  HLD, HTN, pacemaker   PAIN:  PRS scale: 1-2/10 most of the time with a 1 x 5/10 at night Tuesday after gardening Monday Pain location: Left sided low back Pain description: Achy, stiff Aggravating factors: Prolonged static posiitoning Relieving factors: Tylenol arthritis and the hemp rub   PRECAUTIONS: Back   RED FLAGS: None      WEIGHT BEARING RESTRICTIONS: No   FALLS:  Has patient fallen in last 6 months? No   LIVING ENVIRONMENT: Lives with: lives with their family and lives with their daughter Lives in: House/apartment Stairs:  More comfortable with a hand rail but can do Has following equipment at home: None   OCCUPATION: Retired   PLOF: Independent   PATIENT GOALS: Vacuuming, mopping, gardening   NEXT MD VISIT: NA   OBJECTIVE:    DIAGNOSTIC FINDINGS:  11/28/2022 review of chart: X-rays demonstrate no acute or structural abnormalities of the hip joints.   X-rays demonstrate preserved lumbar lordosis.  Facet disease is present  throughout the lumbar spine.   PATIENT SURVEYS:  01/01/2023 FOTO Next 51  11/28/2022 FOTO 64 (risk-adjusted 57, Goal 68 in 11 visits)   SCREENING FOR RED FLAGS: 11/28/2022 Bowel or bladder incontinence: No Spinal tumors: No Cauda equina syndrome: No Compression fracture: No   COGNITION: 11/28/2022 Overall cognitive status: Within functional limits for tasks assessed                          SENSATION: 11/28/2022 No complaints of peripheral pain or  paresthesias   MUSCLE LENGTH: 01/01/2023 Hamstrings: Right 45 deg; Left 40 deg  11/28/2022 Hamstrings: Right 30 deg; Left 35 deg   POSTURE:  11/28/2022 rounded shoulders, forward head, and decreased lumbar lordosis   LUMBAR ROM:    AROM 11/28/2022 12/18/2022 Next visit  Flexion      Extension 5 50% WFL with tightness reported.    Right lateral flexion      Left lateral flexion      Right rotation      Left rotation       (Blank rows = not tested)   LOWER EXTREMITY ROM:      Passive  Left/Right 11/28/2022 Left/Right 01/01/2023   Hip flexion 85/80  90/95  Hip extension      Hip abduction      Hip adduction      Hip internal rotation 12/8  10/12  Hip external rotation 10/23  15/21  Knee flexion      Knee extension      Ankle dorsiflexion      Ankle plantarflexion      Ankle inversion      Ankle eversion       (Blank rows = not tested)   LOWER EXTREMITY STRENGTH:  11/28/2022 Deferred at evaluation due to time   MMT Left/Right 11/28/2022    Hip flexion      Hip extension      Hip abduction      Hip adduction      Hip internal rotation      Hip external rotation      Knee flexion      Knee extension      Ankle dorsiflexion      Ankle plantarflexion      Ankle inversion      Ankle eversion       (Blank  rows = not tested)   BALANCE  12/18/22 0001  Balance  Balance Assessed Yes  Standardized Balance Assessment  Standardized Balance Assessment Berg Balance Test  Berg Balance Test  Sit to Stand 4  Standing Unsupported 4  Sitting with Back Unsupported but Feet Supported on Floor or Stool 4  Stand to Sit 4  Transfers 4  Standing Unsupported with Eyes Closed 4  Standing Unsupported with Feet Together 4  From Standing, Reach Forward with Outstretched Arm 4  From Standing Position, Pick up Object from Floor 4  From Standing Position, Turn to Look Behind Over each Shoulder 4  Turn 360 Degrees 4  Standing Unsupported, Alternately Place Feet on Step/Stool 4   Standing Unsupported, One Foot in Front 3  Standing on One Leg 3  Total Score 54    GAIT: 11/28/2022 Distance walked: 50 feet Assistive device utilized: None Level of assistance: Complete Independence Comments: Slight flexed posture               TODAY'S TREATMENT:                                                             DATE:  01/01/2023 Single knee to chest stretch 4 x 20 seconds bilateral Supine hamstrings stretch 4 x 20 seconds bilateral Figure 4 (push away) stretch 4 x 20 seconds Yoga Bridge 10 x 5 seconds Lumbar extension AROM 10 x 3 seconds Shoulder blade pinches 10 x 5 seconds Hip hike 2 sets of 10 for 3 seconds  Functional Activities: Reviewed logroll, postural and body mechanics education for gardening, reviewed updated home exercise program   12/20/2022 Single knee to chest stretch 5 x 20 seconds bilateral Supine hamstrings stretch 5 x 20 seconds bilateral Figure 4 (push away) stretch 5 x 20 seconds Yoga Bridge 10 x 5 seconds Lumbar extension AROM 10 x 3 seconds Shoulder blade pinches 10 x 5 seconds  Neuromuscular re-education: Tandem balance 6 x 20 seconds (cues needed for step strategy)   Functional Activities: Reviewed logroll, basic postural and body mechanics education, reviewed home exercise program Sit to stand slow eccentrics 10 x 3 seconds    12/18/2022 Therex: Nustep Lvl 5 8 mins UE/LE with instruction for aerobic exercise in home program Sit to stand to sit with cues for home use 18 inch chair x 11 Standing lumbar extension AROM x 5 Supine lumbar trunk rotation stretch  15 sec x 3 bilateral Supine figure 4 push away 15 sec x 3 bilateral (limited by symptoms) Supine bridge x 10   Neuro Re-ed Alt toe tapping on 6 inch step x 5 bilateral c SBA Tandem stance 30 sec x 1 bilateral with SBA, cues for home use in corner [place] testing and performance   PATIENT EDUCATION:  12/18/2022 Education details: HEP update Person educated:  Patient Education method: Programmer, multimedia, Facilities manager, Verbal cues, demonstration, and Handouts Education comprehension: verbalized understanding, returned demonstration, verbal cues required   HOME EXERCISE PROGRAM: Access Code: [email or web address] URL: [email or web address] Date: 12/20/2022 Prepared by: Pauletta Browns  Exercises - Standing Lumbar Extension at Wall - Forearms  - 5 x daily - 7 x weekly - 1 sets - 5 reps - 3 seconds hold - Standing Scapular Retraction  - 5 x daily - 7 x weekly - 1 sets - 5  reps - 5 second hold - Single Knee to Chest Stretch  - 2 x daily - 7 x weekly - 1 sets - 5 reps - 20 seconds hold - Supine Hamstring Stretch  - 2 x daily - 7 x weekly - 1 sets - 5 reps - 20 seconds hold - Sit to Stand  - 2 x daily - 7 x weekly - 1 sets - 10 reps - Tandem Stance in Corner  - 1 x daily - 7 x weekly - 1 sets - 3-5 reps - 30 hold - Supine Figure 4 Piriformis Stretch  - 2 x daily - 7 x weekly - 1 sets - 5 reps - 20 seconds hold - Yoga Bridge  - 2 x daily - 7 x weekly - 1 sets - 10 reps - 5 seconds hold   ASSESSMENT:   CLINICAL IMPRESSION: Lauren Holloway notes early progress with her physical therapy.  Consistency with her HEP (compliance, but not as much as prescribed) and avoiding flexed postures (like gardening) will allow Lauren Holloway to meet long-term goals.  Her prognosis remains good if HEP compliance and body mechanics awareness can improve.     OBJECTIVE IMPAIRMENTS:decreased activity tolerance, decreased endurance, decreased knowledge of condition, decreased ROM, decreased strength, decreased safety awareness, impaired perceived functional ability, increased muscle spasms, impaired flexibility, improper body mechanics, postural dysfunction, and pain.    ACTIVITY LIMITATIONS: carrying, lifting, bending, sitting, sleeping, and bed mobility   PARTICIPATION LIMITATIONS: cleaning and community activity   PERSONAL FACTORS: HLD, HTN, pacemaker are also affecting patient's functional  outcome.    REHAB POTENTIAL: Good   CLINICAL DECISION MAKING: Stable/uncomplicated   EVALUATION COMPLEXITY: Low     GOALS: Goals reviewed with patient? Yes   SHORT TERM GOALS: Target date: 12/26/2022   Lauren Holloway will be independent with her day 1 HEP. Baseline: Started 11/28/2022 Goal status: Met 12/20/2022   2.  Improve lumbar extension AROM to 10 degrees. Baseline: 5 degrees Goal status: On going 01/01/2023   3.  Lauren Holloway will have improved posture and body mechanics awareness and will be able to implement these into daily activities. Baseline: Started education 11/28/2022 Goal status: On going 01/01/2023   LONG TERM GOALS: Target date: 01/23/2023   Improve FOTO to 68 in 11 visits. Baseline: 64, risk-adjusted 57 Goal status: On Going 01/01/2023   2.  Improve left side low back pain to consistently 0-2/10 on the Numeric Pain Rating Scale. Baseline: 0-5/10 Goal status: On Going 01/01/2023   3.  Improve bilateral lower extremity flexibility for hip flexors to 100 degrees; hamstrings to 45 degrees and hip external rotation to 40 degrees Baseline: (Lt/Rt in degrees): 85/80; 35/30 and 10/23 respectively Goal status: Partially Met 01/01/2023   4.  Lauren Holloway will be able to sit for 30 or more minutes with correct posture and lumbar support without having to move or change position due to pain Baseline: Limited sitting endurance due to pain Goal status: On Going 01/01/2023   5.  Lauren Holloway will be independent with a long-term home exercise program at discharge Baseline: Started 11/28/2022 Goal status: INITIAL     PLAN:   PT FREQUENCY: 1-2x/week   PT DURATION: 8 weeks   PLANNED INTERVENTIONS: Therapeutic exercises, Therapeutic activity, Neuromuscular re-education, Balance training, Gait training, Patient/Family education, Self Care, Stair training, Dry Needling, Cryotherapy, and Manual therapy.   PLAN FOR NEXT SESSION: Objective lumbar spine measures, practical body mechanics, FOTO  (with help).   Cherlyn Cushing PT, MPT 01/01/23  5:12 PM

## 2023-01-06 ENCOUNTER — Ambulatory Visit: Payer: Medicare PPO | Attending: Internal Medicine | Admitting: Internal Medicine

## 2023-01-06 ENCOUNTER — Encounter: Payer: Self-pay | Admitting: Internal Medicine

## 2023-01-06 VITALS — BP 120/62 | HR 60 | Ht 64.0 in | Wt 158.0 lb

## 2023-01-06 DIAGNOSIS — I441 Atrioventricular block, second degree: Secondary | ICD-10-CM

## 2023-01-06 LAB — CUP PACEART INCLINIC DEVICE CHECK
Date Time Interrogation Session: 20241104195840
Implantable Lead Connection Status: 753985
Implantable Lead Connection Status: 753985
Implantable Lead Implant Date: 20190923
Implantable Lead Implant Date: 20190923
Implantable Lead Location: 753859
Implantable Lead Location: 753860
Implantable Lead Model: 3830
Implantable Lead Model: 5076
Implantable Pulse Generator Implant Date: 20190923

## 2023-01-06 NOTE — Progress Notes (Signed)
HPI Lauren Holloway returns today for followup. 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 She has HTN. She denies chest pain or sob. No edema. No syncope. She feels well. She has not had any palpitations and no edema.  Allergies  Allergen Reactions   Metformin     Other reaction(s): GI Upset (intolerance)   Sulfonamide Derivatives     HIVES    Latex Rash   Molnupiravir Rash   Sulfasalazine     Other reaction(s): Other (See Comments) HIVES     Current Outpatient Medications  Medication Sig Dispense Refill   acetaminophen (TYLENOL) 500 MG tablet Take 2 tablets (1,000 mg total) by mouth every 6 (six) hours as needed. 30 tablet 0   Alirocumab (PRALUENT) 150 MG/ML SOAJ Inject 150 mg into the skin every 14 (fourteen) days.     ALPRAZolam (XANAX) 0.5 MG tablet Take 0.25 mg by mouth 2 (two) times daily as needed for anxiety or sleep.  3   amLODipine (NORVASC) 5 MG tablet TAKE 1 TABLET EVERY DAY (DOSE DECREASED) 90 tablet 0   azithromycin (ZITHROMAX) 250 MG tablet Take 1 tablet (250 mg total) by mouth daily. Take first 2 tablets together, then 1 every day until finished. 6 tablet 0   escitalopram (LEXAPRO) 10 MG tablet Take 10 mg by mouth daily.     Fish Oil-Cholecalciferol (FISH OIL + D3 PO) Take 1 tablet by mouth daily.      lisinopril-hydrochlorothiazide (PRINZIDE,ZESTORETIC) 20-25 MG per tablet Take 1 tablet by mouth daily.     metoprolol tartrate (LOPRESSOR) 25 MG tablet Take 25 mg by mouth 2 (two) times daily.      Multiple Vitamins-Minerals (MULTIVITAMIN WITH MINERALS) tablet Take 1 tablet by mouth daily.     potassium chloride (KLOR-CON M) 10 MEQ tablet Take 30 mEq by mouth daily.     predniSONE (STERAPRED UNI-PAK 21 TAB) 10 MG (21) TBPK tablet Take as directed 21 tablet 3   torsemide (DEMADEX) 20 MG tablet Take 1 tablet by mouth every morning. TAKES 2 TABS MON & THU     traMADol (ULTRAM) 50 MG tablet Take 1 tablet (50 mg total)  by mouth every 12 (twelve) hours as needed. 30 tablet 2   No current facility-administered medications for this visit.     Past Medical History:  Diagnosis Date   Cataract    Hyperlipidemia    Hypertension     ROS:   All systems reviewed and negative except as noted in the HPI.   Past Surgical History:  Procedure Laterality Date   ABDOMINAL HYSTERECTOMY     APPENDECTOMY     BILATERAL OOPHORECTOMY     CATARACT EXTRACTION, BILATERAL Bilateral 1996   PACEMAKER IMPLANT N/A 11/24/2017   Procedure: PACEMAKER IMPLANT;  Surgeon: Marinus Maw, MD;  Location: [facility];  Service: Cardiovascular;  Laterality: N/A;     Family History  Problem Relation Age of Onset   Ovarian cancer Mother    Hypertension Mother    Hypertension Father    Heart disease Father    Hypertension Sister    Hypertension Sister      Social History   Socioeconomic History   Marital status: Widowed    Spouse name: Not on file   Number of children: 1   Years of education: Not on file   Highest education level: Not on file  Occupational History  Not on file  Tobacco Use   Smoking status: Never   Smokeless tobacco: Never  Vaping Use   Vaping status: Never Used  Substance and Sexual Activity   Alcohol use: Yes    Comment: MIX OCCASIONALLY   Drug use: No   Sexual activity: Not on file  Other Topics Concern   Not on file  Social History Narrative   Not on file   Social Determinants of Health   Financial Resource Strain: Not on file  Food Insecurity: Not on file  Transportation Needs: Not on file  Physical Activity: Not on file  Stress: Not on file  Social Connections: Not on file  Intimate Partner Violence: Not on file     BP 120/62   Pulse 60   Ht 5\' 4"  (1.626 m)   Wt 158 lb (71.7 kg)   SpO2 92%   BMI 27.12 kg/m   Physical Exam:  Well appearing 89 yo woman looking younger than her stated age, NAD HEENT: Unremarkable Neck:  No JVD, no thyromegally Lymphatics:   No adenopathy Back:  No CVA tenderness Lungs:  Clear with no wheezes HEART:  Regular rate rhythm, no murmurs, no rubs, no clicks Abd:  soft, positive bowel sounds, no organomegally, no rebound, no guarding Ext:  2 plus pulses, no edema, no cyanosis, no clubbing Skin:  No rashes no nodules Neuro:  CN II through XII intact, motor grossly intact  EKG - NSR with AV pacing  DEVICE  Normal device function.  See PaceArt for details.   Assess/Plan: Heart block - she is doing well s/p PPM insertion. No escape today. PPM - her [employer] device is working normally.  HTN - her bp is well controlled. We will follow. Dyslipidemia - she is on praluent.    Sharlot Gowda Brigid Vandekamp,MD

## 2023-01-06 NOTE — Patient Instructions (Signed)

## 2023-01-08 ENCOUNTER — Encounter: Payer: Self-pay | Admitting: Rehabilitative and Restorative Service Providers"

## 2023-01-08 ENCOUNTER — Ambulatory Visit: Payer: Medicare PPO | Admitting: Rehabilitative and Restorative Service Providers"

## 2023-01-08 DIAGNOSIS — M5459 Other low back pain: Secondary | ICD-10-CM | POA: Diagnosis not present

## 2023-01-08 DIAGNOSIS — R293 Abnormal posture: Secondary | ICD-10-CM | POA: Diagnosis not present

## 2023-01-08 NOTE — Therapy (Signed)
OUTPATIENT PHYSICAL THERAPY TREATMENT/DISCHARGE NOTE  PHYSICAL THERAPY DISCHARGE SUMMARY  Visits from Start of Care: 5  Current functional level related to goals / functional outcomes: See note   Remaining deficits: See note   Education / Equipment: Updated HEP   Patient agrees to discharge. Patient goals were met. Patient is being discharged due to being pleased with the current functional level.   Patient Name: Lauren Holloway MRN: [id number] DOB:October 03, 1931, 89 y.o., female Today's Date: 01/08/2023  END OF SESSION:   PT End of Session - 01/08/23 1515     Visit Number 5    Number of Visits 12    Date for PT Re-Evaluation 01/23/23    Authorization Type [employer]    Authorization Time Period Through 01/23/2023    Authorization - Visit Number 5    Authorization - Number of Visits 12    Progress Note Due on Visit 12    PT Start Time 1514    PT Stop Time 1558    PT Time Calculation (min) 44 min    Activity Tolerance Patient tolerated treatment well;No increased pain    Behavior During Therapy WFL for tasks assessed/performed              Past Medical History:  Diagnosis Date   Cataract    Hyperlipidemia    Hypertension    Past Surgical History:  Procedure Laterality Date   ABDOMINAL HYSTERECTOMY     APPENDECTOMY     BILATERAL OOPHORECTOMY     CATARACT EXTRACTION, BILATERAL Bilateral 1996   PACEMAKER IMPLANT N/A 11/24/2017   Procedure: PACEMAKER IMPLANT;  Surgeon: Marinus Maw, MD;  Location: [facility];  Service: Cardiovascular;  Laterality: N/A;   Patient Active Problem List   Diagnosis Date Noted   Syncope 10/11/2020   Pacemaker 10/11/2020   AV heart block 11/20/2017   Pain in right hip 10/17/2017   Chronic left shoulder pain 03/17/2017   Grade 2 ankle sprain, unspecified laterality, subsequent encounter 05/28/2016   Dyspnea 10/25/2013   Hypertension 05/18/2010   ABDOMINAL PAIN RIGHT LOWER QUADRANT 05/18/2010   History of colonic polyps  05/18/2010     THERAPY DIAG:  Abnormal posture  Other low back pain   PCP: Rodrigo Ran, MD   REFERRING PROVIDER: Tarry Kos, MD   REFERRING DIAG:    [phone] (ICD-10-CM) - Pain in left hip      Rationale for Evaluation and Treatment: Rehabilitation   THERAPY DIAG:  Abnormal posture - Plan: PT plan of care cert/re-cert   Other low back pain - Plan: PT plan of care cert/re-cert   ONSET DATE: 2-3 months    SUBJECTIVE:  SUBJECTIVE STATEMENT: Lauren Holloway notes back pain is 0-3/10 this week.  She is happy with her progress with her left sided low back pain since starting PT.  She has been very careful with body mechanics with activities with too much bending (like gardening).  PERTINENT HISTORY:  HLD, HTN, pacemaker   PAIN:  PRS scale: 1-2/10 most of the time with occasional 3/10 late in the day or with overuse Pain location: Left sided low back Pain description: Achy, stiff Aggravating factors: Prolonged static posiitoning Relieving factors: Tylenol arthritis and the hemp rub   PRECAUTIONS: Back   RED FLAGS: None      WEIGHT BEARING RESTRICTIONS: No   FALLS:  Has patient fallen in last 6 months? No   LIVING ENVIRONMENT: Lives with: lives with their family and lives with their daughter Lives in: House/apartment Stairs:  More comfortable with a hand rail but can do Has following equipment at home: None   OCCUPATION: Retired   PLOF: Independent   PATIENT GOALS: Vacuuming, mopping, gardening   NEXT MD VISIT: NA   OBJECTIVE:    DIAGNOSTIC FINDINGS:  11/28/2022 review of chart: X-rays demonstrate no acute or structural abnormalities of the hip joints.   X-rays demonstrate preserved lumbar lordosis.  Facet disease is present  throughout the lumbar spine.   PATIENT SURVEYS:   01/08/2023 FOTO 86 (Goal met)  01/01/2023 FOTO Next 51  11/28/2022 FOTO 64 (risk-adjusted 57, Goal 68 in 11 visits)   SCREENING FOR RED FLAGS: 11/28/2022 Bowel or bladder incontinence: No Spinal tumors: No Cauda equina syndrome: No Compression fracture: No   COGNITION: 11/28/2022 Overall cognitive status: Within functional limits for tasks assessed                          SENSATION: 11/28/2022 No complaints of peripheral pain or paresthesias   MUSCLE LENGTH: 01/01/2023 Hamstrings: Right 45 deg; Left 40 deg  11/28/2022 Hamstrings: Right 30 deg; Left 35 deg   POSTURE:  11/28/2022 rounded shoulders, forward head, and decreased lumbar lordosis   LUMBAR ROM:    AROM 11/28/2022 12/18/2022 01/08/2023  Flexion      Extension 5 50% WFL with tightness reported.  10  Right lateral flexion      Left lateral flexion      Right rotation      Left rotation       (Blank rows = not tested)   LOWER EXTREMITY ROM:      Passive  Left/Right 11/28/2022 Left/Right 01/01/2023   Hip flexion 85/80  90/95  Hip extension      Hip abduction      Hip adduction      Hip internal rotation 12/8  10/12  Hip external rotation 10/23  15/21  Knee flexion      Knee extension      Ankle dorsiflexion      Ankle plantarflexion      Ankle inversion      Ankle eversion       (Blank rows = not tested)   LOWER EXTREMITY STRENGTH:  11/28/2022 Deferred at evaluation due to time   MMT Left/Right 11/28/2022    Hip flexion      Hip extension      Hip abduction      Hip adduction      Hip internal rotation      Hip external rotation      Knee flexion      Knee extension  Ankle dorsiflexion      Ankle plantarflexion      Ankle inversion      Ankle eversion       (Blank rows = not tested)   BALANCE  12/18/22 0001  Balance  Balance Assessed Yes  Standardized Balance Assessment  Standardized Balance Assessment Berg Balance Test  Berg Balance Test  Sit to Stand 4  Standing Unsupported 4   Sitting with Back Unsupported but Feet Supported on Floor or Stool 4  Stand to Sit 4  Transfers 4  Standing Unsupported with Eyes Closed 4  Standing Unsupported with Feet Together 4  From Standing, Reach Forward with Outstretched Arm 4  From Standing Position, Pick up Object from Floor 4  From Standing Position, Turn to Look Behind Over each Shoulder 4  Turn 360 Degrees 4  Standing Unsupported, Alternately Place Feet on Step/Stool 4  Standing Unsupported, One Foot in Front 3  Standing on One Leg 3  Total Score 54    GAIT: 11/28/2022 Distance walked: 50 feet Assistive device utilized: None Level of assistance: Complete Independence Comments: Slight flexed posture               TODAY'S TREATMENT:                                                             DATE:  01/08/2023 Single knee to chest stretch 4 x 20 seconds bilateral Supine hamstrings stretch 4 x 20 seconds bilateral Figure 4 (push away) stretch 4 x 20 seconds Yoga Bridge 10 x 5 seconds Lumbar extension AROM 10 x 3 seconds Shoulder blade pinches 10 x 5 seconds Hip hike 2 sets of 10 for 3 seconds  Functional Activities: Reviewed bed mobility, spine anatomy, reviewed final updated home exercise program   01/01/2023 Single knee to chest stretch 4 x 20 seconds bilateral Supine hamstrings stretch 4 x 20 seconds bilateral Figure 4 (push away) stretch 4 x 20 seconds Yoga Bridge 10 x 5 seconds Lumbar extension AROM 10 x 3 seconds Shoulder blade pinches 10 x 5 seconds Hip hike 2 sets of 10 for 3 seconds  Functional Activities: Reviewed logroll, postural and body mechanics education for gardening, reviewed updated home exercise program   12/20/2022 Single knee to chest stretch 5 x 20 seconds bilateral Supine hamstrings stretch 5 x 20 seconds bilateral Figure 4 (push away) stretch 5 x 20 seconds Yoga Bridge 10 x 5 seconds Lumbar extension AROM 10 x 3 seconds Shoulder blade pinches 10 x 5 seconds  Neuromuscular  re-education: Tandem balance 6 x 20 seconds (cues needed for step strategy)   Functional Activities: Reviewed logroll, basic postural and body mechanics education, reviewed home exercise program Sit to stand slow eccentrics 10 x 3 seconds    PATIENT EDUCATION:  12/18/2022 Education details: HEP update Person educated: Patient Education method: Programmer, multimedia, Facilities manager, Verbal cues, demonstration, and Handouts Education comprehension: verbalized understanding, returned demonstration, verbal cues required   HOME EXERCISE PROGRAM: Access Code: [email or web address] URL: [email or web address] Date: 12/20/2022 Prepared by: Pauletta Browns  Exercises - Standing Lumbar Extension at Wall - Forearms  - 5 x daily - 7 x weekly - 1 sets - 5 reps - 3 seconds hold - Standing Scapular Retraction  - 5 x daily - 7 x weekly - 1 sets -  5 reps - 5 second hold - Single Knee to Chest Stretch  - 2 x daily - 7 x weekly - 1 sets - 5 reps - 20 seconds hold - Supine Hamstring Stretch  - 2 x daily - 7 x weekly - 1 sets - 5 reps - 20 seconds hold - Sit to Stand  - 2 x daily - 7 x weekly - 1 sets - 10 reps - Tandem Stance in Corner  - 1 x daily - 7 x weekly - 1 sets - 3-5 reps - 30 hold - Supine Figure 4 Piriformis Stretch  - 2 x daily - 7 x weekly - 1 sets - 5 reps - 20 seconds hold - Yoga Bridge  - 2 x daily - 7 x weekly - 1 sets - 10 reps - 5 seconds hold   ASSESSMENT:   CLINICAL IMPRESSION: Ladora notes continued progress with her physical therapy.  Consistency with her HEP.  Compliance and avoiding flexed postures (like gardening) will allow Kaiesha to meet long-term goals and have long-term success.  She is happy with her progress and feels ready to transfer into independent rehabilitation.   OBJECTIVE IMPAIRMENTS:decreased activity tolerance, decreased endurance, decreased knowledge of condition, decreased ROM, decreased strength, decreased safety awareness, impaired perceived functional ability,  increased muscle spasms, impaired flexibility, improper body mechanics, postural dysfunction, and pain.    ACTIVITY LIMITATIONS: carrying, lifting, bending, sitting, sleeping, and bed mobility   PARTICIPATION LIMITATIONS: cleaning and community activity   PERSONAL FACTORS: HLD, HTN, pacemaker are also affecting patient's functional outcome.    REHAB POTENTIAL: Good   CLINICAL DECISION MAKING: Stable/uncomplicated   EVALUATION COMPLEXITY: Low     GOALS: Goals reviewed with patient? Yes   SHORT TERM GOALS: Target date: 12/26/2022   Danyiel will be independent with her day 1 HEP. Baseline: Started 11/28/2022 Goal status: Met 12/20/2022   2.  Improve lumbar extension AROM to 10 degrees. Baseline: 5 degrees Goal status: Met 01/08/2023   3.  Deola will have improved posture and body mechanics awareness and will be able to implement these into daily activities. Baseline: Started education 11/28/2022 Goal status: Met 01/08/2023   LONG TERM GOALS: Target date: 01/23/2023   Improve FOTO to 68 in 11 visits. Baseline: 64, risk-adjusted 57 Goal status: Met 01/08/2023   2.  Improve left side low back pain to consistently 0-2/10 on the Numeric Pain Rating Scale. Baseline: 0-5/10 Goal status: On Going 01/08/2023   3.  Improve bilateral lower extremity flexibility for hip flexors to 100 degrees; hamstrings to 45 degrees and hip external rotation to 40 degrees Baseline: (Lt/Rt in degrees): 85/80; 35/30 and 10/23 respectively Goal status: Partially Met 01/01/2023   4.  Maycie will be able to sit for 30 or more minutes with correct posture and lumbar support without having to move or change position due to pain Baseline: Limited sitting endurance due to pain Goal status: Met 01/08/2023   5.  Fabian will be independent with a long-term home exercise program at discharge Baseline: Started 11/28/2022 Goal status: Met 01/08/2023     PLAN:   PT FREQUENCY: DC   PT DURATION: DC   PLANNED  INTERVENTIONS: Therapeutic exercises, Therapeutic activity, Neuromuscular re-education, Balance training, Gait training, Patient/Family education, Self Care, Stair training, Dry Needling, Cryotherapy, and Manual therapy.   PLAN FOR NEXT SESSION: DC   Cherlyn Cushing PT, MPT 01/08/23  4:59 PM

## 2023-01-10 ENCOUNTER — Ambulatory Visit: Payer: Medicare PPO | Admitting: Nurse Practitioner

## 2023-02-10 ENCOUNTER — Ambulatory Visit: Payer: Medicare PPO | Admitting: Podiatry

## 2023-02-17 ENCOUNTER — Ambulatory Visit: Payer: Medicare PPO | Admitting: Podiatry

## 2023-02-17 DIAGNOSIS — B351 Tinea unguium: Secondary | ICD-10-CM | POA: Diagnosis not present

## 2023-02-17 DIAGNOSIS — M79675 Pain in left toe(s): Secondary | ICD-10-CM | POA: Diagnosis not present

## 2023-02-17 MED ORDER — CICLOPIROX 8 % EX SOLN: Freq: Every day | CUTANEOUS | 11 refills | Status: AC

## 2023-02-17 NOTE — Progress Notes (Signed)
Chief Complaint  Patient presents with   Nail Problem    Left hallux nail, having pain sometimes it will be red and swelling, no drainage   HPI: 89 y.o. female presents today with concern of pain in the left great toenail.  She states that the medial lateral proximal nail fold and direct pressure on the nail causes discomfort.  This is aggravated in shoe gear.  She states that it also can part without shoes on.  She also notes mild discomfort at the first MPJ.  Denies any injury.  She does note that the nail is thick.  Past Medical History:  Diagnosis Date   Cataract    Hyperlipidemia    Hypertension     Past Surgical History:  Procedure Laterality Date   ABDOMINAL HYSTERECTOMY     APPENDECTOMY     BILATERAL OOPHORECTOMY     CATARACT EXTRACTION, BILATERAL Bilateral 1996   PACEMAKER IMPLANT N/A 11/24/2017   Procedure: PACEMAKER IMPLANT;  Surgeon: Marinus Maw, MD;  Location: [facility];  Service: Cardiovascular;  Laterality: N/A;    Allergies  Allergen Reactions   Atorvastatin     Other Reaction(s): aching in legs   Hydralazine     Other Reaction(s): severe fatigue. couldn't do anything   Metformin     Other reaction(s): GI Upset (intolerance)  Other Reaction(s): GI Intolerance, upset stomach   Pravastatin     Other Reaction(s): joint pains 2016   Rosuvastatin     Other Reaction(s): all sorrts of joint pains.   Latex Rash    Other Reaction(s): hives   Molnupiravir Rash   Sulfasalazine Other (See Comments)    Other reaction(s): Other (See Comments)  HIVES   Sulfonamide Derivatives Rash    Physical Exam: Palpable DP nonpalpable PT pulse left foot.  The left nail is 3 to 4 mm thick with yellow discoloration, subungual debris, distal onycholysis and pain with compression.  There is no erythema or edema of the surrounding periungual region.  Decreased range of motion of the first MPJ on the left.  No calor or rubor noted  Assessment/Plan of Care: 1.  Dermatophytosis of nail   2. Pain in left toe(s)      Meds ordered this encounter  Medications   ciclopirox (PENLAC) 8 % solution    Sig: Apply topically at bedtime. Apply thin amount to the toenail.  Try to avoid getting on the skin. Apply daily over previous coat. Remove weekly with polish remover and a nail file.    Dispense:  6.6 mL    Refill:  11   Discussed clinical findings with patient today.  Since the entire nail and periungual area have discomfort, recommended avulsion of the nail followed by treatment of the fungus and remaining nailbed with antifungal as the new nail grows in.  Due to the upcoming holidays the patient wanted to hold off until the new year.  I discussed the procedure with her in detail today.  Will go ahead and start prescription ciclopirox lacquer for the toenail fungus.  This was sent to her pharmacy and she was instructed on how to apply the medication  Follow-up in January for avulsion of the left great toenail   Kimmi Acocella D. Braxdon Gappa, DPM, FACFAS [facility]     [address]  [place], [place] [address]                Office [phone]  Fax [phone]

## 2023-03-11 ENCOUNTER — Ambulatory Visit (INDEPENDENT_AMBULATORY_CARE_PROVIDER_SITE_OTHER): Payer: Medicare PPO

## 2023-03-11 DIAGNOSIS — I441 Atrioventricular block, second degree: Secondary | ICD-10-CM | POA: Diagnosis not present

## 2023-03-12 LAB — CUP PACEART REMOTE DEVICE CHECK
Battery Remaining Longevity: 24 mo
Battery Voltage: 2.91 V
Brady Statistic AP VP Percent: 70.8 %
Brady Statistic AP VS Percent: 0.01 %
Brady Statistic AS VP Percent: 29.16 %
Brady Statistic AS VS Percent: 0.03 %
Brady Statistic RA Percent Paced: 70.81 %
Brady Statistic RV Percent Paced: 99.95 %
Date Time Interrogation Session: 20250106235551
Implantable Lead Connection Status: 753985
Implantable Lead Connection Status: 753985
Implantable Lead Implant Date: 20190923
Implantable Lead Implant Date: 20190923
Implantable Lead Location: 753859
Implantable Lead Location: 753860
Implantable Lead Model: 3830
Implantable Lead Model: 5076
Implantable Pulse Generator Implant Date: 20190923
Lead Channel Impedance Value: 342 Ohm
Lead Channel Impedance Value: 342 Ohm
Lead Channel Impedance Value: 380 Ohm
Lead Channel Impedance Value: 418 Ohm
Lead Channel Pacing Threshold Amplitude: 0.5 V
Lead Channel Pacing Threshold Amplitude: 1.5 V
Lead Channel Pacing Threshold Pulse Width: 0.4 ms
Lead Channel Pacing Threshold Pulse Width: 0.4 ms
Lead Channel Sensing Intrinsic Amplitude: 1.25 mV
Lead Channel Sensing Intrinsic Amplitude: 1.25 mV
Lead Channel Sensing Intrinsic Amplitude: 7.875 mV
Lead Channel Sensing Intrinsic Amplitude: 7.875 mV
Lead Channel Setting Pacing Amplitude: 1.5 V
Lead Channel Setting Pacing Amplitude: 2.5 V
Lead Channel Setting Pacing Pulse Width: 1 ms
Lead Channel Setting Sensing Sensitivity: 1.2 mV
Zone Setting Status: 755011
Zone Setting Status: 755011

## 2023-04-22 NOTE — Progress Notes (Signed)
 Remote pacemaker transmission.

## 2023-04-22 NOTE — Addendum Note (Signed)
 Addended by: Geralyn Flash D on: 04/22/2023 12:08 PM   Modules accepted: Orders

## 2023-06-11 ENCOUNTER — Ambulatory Visit (INDEPENDENT_AMBULATORY_CARE_PROVIDER_SITE_OTHER): Payer: Medicare PPO

## 2023-06-11 DIAGNOSIS — I441 Atrioventricular block, second degree: Secondary | ICD-10-CM

## 2023-06-11 LAB — CUP PACEART REMOTE DEVICE CHECK
Battery Remaining Longevity: 22 mo
Battery Voltage: 2.9 V
Brady Statistic AP VP Percent: 74.41 %
Brady Statistic AP VS Percent: 0.21 %
Brady Statistic AS VP Percent: 25.33 %
Brady Statistic AS VS Percent: 0.06 %
Brady Statistic RA Percent Paced: 74.62 %
Brady Statistic RV Percent Paced: 99.74 %
Date Time Interrogation Session: 20250408223417
Implantable Lead Connection Status: 753985
Implantable Lead Connection Status: 753985
Implantable Lead Implant Date: 20190923
Implantable Lead Implant Date: 20190923
Implantable Lead Location: 753859
Implantable Lead Location: 753860
Implantable Lead Model: 3830
Implantable Lead Model: 5076
Implantable Pulse Generator Implant Date: 20190923
Lead Channel Impedance Value: 323 Ohm
Lead Channel Impedance Value: 342 Ohm
Lead Channel Impedance Value: 399 Ohm
Lead Channel Impedance Value: 418 Ohm
Lead Channel Pacing Threshold Amplitude: 0.5 V
Lead Channel Pacing Threshold Amplitude: 1.5 V
Lead Channel Pacing Threshold Pulse Width: 0.4 ms
Lead Channel Pacing Threshold Pulse Width: 0.4 ms
Lead Channel Sensing Intrinsic Amplitude: 1.5 mV
Lead Channel Sensing Intrinsic Amplitude: 1.5 mV
Lead Channel Sensing Intrinsic Amplitude: 7.75 mV
Lead Channel Sensing Intrinsic Amplitude: 7.75 mV
Lead Channel Setting Pacing Amplitude: 1.5 V
Lead Channel Setting Pacing Amplitude: 2.5 V
Lead Channel Setting Pacing Pulse Width: 1 ms
Lead Channel Setting Sensing Sensitivity: 1.2 mV
Zone Setting Status: 755011
Zone Setting Status: 755011

## 2023-06-30 ENCOUNTER — Emergency Department (HOSPITAL_COMMUNITY)
Admission: EM | Admit: 2023-06-30 | Discharge: 2023-06-30 | Disposition: A | Discharge status: Home / Self Care | Attending: Emergency Medicine | Admitting: Emergency Medicine

## 2023-06-30 ENCOUNTER — Other Ambulatory Visit: Payer: Self-pay

## 2023-06-30 ENCOUNTER — Encounter (HOSPITAL_COMMUNITY): Payer: Self-pay

## 2023-06-30 ENCOUNTER — Emergency Department (HOSPITAL_COMMUNITY)

## 2023-06-30 DIAGNOSIS — S0990XA Unspecified injury of head, initial encounter: Secondary | ICD-10-CM | POA: Insufficient documentation

## 2023-06-30 DIAGNOSIS — S46911A Strain of unspecified muscle, fascia and tendon at shoulder and upper arm level, right arm, initial encounter: Secondary | ICD-10-CM | POA: Diagnosis not present

## 2023-06-30 DIAGNOSIS — W19XXXA Unspecified fall, initial encounter: Secondary | ICD-10-CM

## 2023-06-30 DIAGNOSIS — M25511 Pain in right shoulder: Secondary | ICD-10-CM | POA: Diagnosis present

## 2023-06-30 DIAGNOSIS — W109XXA Fall (on) (from) unspecified stairs and steps, initial encounter: Secondary | ICD-10-CM | POA: Insufficient documentation

## 2023-06-30 DIAGNOSIS — Z91040 Latex allergy status: Secondary | ICD-10-CM | POA: Insufficient documentation

## 2023-06-30 NOTE — ED Notes (Signed)
 Awaiting patient from lobby.

## 2023-06-30 NOTE — ED Triage Notes (Signed)
 Patient reports on Friday she fell backwards off approx 4 steps leading to her attic and landing on her back and hit head.  No blood thinners.  Reports she felt woozie but laid there for awhile then got up.  Complains of mild upper back pain, and intermittent sharp pain in head and some upper chest soreness.  Patient ambulatory to triage and appears well.

## 2023-06-30 NOTE — ED Notes (Signed)
 Patient transported to X-ray

## 2023-06-30 NOTE — ED Provider Notes (Signed)
 [facility] Provider Note   CSN: [id number] Arrival date & time: 06/30/23  1422     History  Chief Complaint  Patient presents with   Lauren Holloway    MERAIAH PETTERSON is a 89 y.o. female.  Patient presents for assessment since falling on Friday.  Patient's very healthy and active for age and excellently tripped and fell back down a few steps hitting her back in the back of her head.  No syncope seizures or vomiting.  Patient has mild upper back pain and mild right shoulder pain.  Initially had pain in the head that resolved.  No blood thinner use.  The history is provided by the patient.  Fall This is a new problem. Pertinent negatives include no chest pain, no abdominal pain, no headaches and no shortness of breath.       Home Medications Prior to Admission medications   Medication Sig Start Date End Date Taking? Authorizing Provider  acetaminophen  (TYLENOL ) 500 MG tablet Take 2 tablets (1,000 mg total) by mouth every 6 (six) hours as needed. 12/01/21   Starlene Eaton, FNP  Alirocumab (PRALUENT) 150 MG/ML SOAJ Inject 150 mg into the skin every 14 (fourteen) days.    [provider]  ALPRAZolam  (XANAX ) 0.5 MG tablet Take 0.25 mg by mouth 2 (two) times daily as needed for anxiety or sleep. 06/17/16   [provider]  amLODipine  (NORVASC ) 5 MG tablet TAKE 1 TABLET EVERY DAY (DOSE DECREASED) 12/05/22   Tammie Fall, MD  azithromycin  (ZITHROMAX ) 250 MG tablet Take 1 tablet (250 mg total) by mouth daily. Take first 2 tablets together, then 1 every day until finished. 06/15/22   Starlene Eaton, FNP  ciclopirox  (PENLAC ) 8 % solution Apply topically at bedtime. Apply thin amount to the toenail.  Try to avoid getting on the skin. Apply daily over previous coat. Remove weekly with polish remover and a nail file. 02/17/23   McCaughan, Dia D, DPM  escitalopram  (LEXAPRO ) 10 MG tablet Take 10 mg by mouth daily. 10/16/21    [provider]  Fish Oil-Cholecalciferol (FISH OIL + D3 PO) Take 1 tablet by mouth daily.     [provider]  lisinopril -hydrochlorothiazide  (PRINZIDE ,ZESTORETIC ) 20-25 MG per tablet Take 1 tablet by mouth daily. 05/03/14   [provider]  metoprolol tartrate (LOPRESSOR) 25 MG tablet Take 25 mg by mouth 2 (two) times daily.  03/18/13   [provider]  Multiple Vitamins-Minerals (MULTIVITAMIN WITH MINERALS) tablet Take 1 tablet by mouth daily.    [provider]  potassium chloride (KLOR-CON M) 10 MEQ tablet Take 30 mEq by mouth daily. 12/10/21   [provider]  predniSONE  (STERAPRED UNI-PAK 21 TAB) 10 MG (21) TBPK tablet Take as directed 11/07/22   Wes Hamman, MD  torsemide (DEMADEX) 20 MG tablet Take 1 tablet by mouth every morning. TAKES 2 TABS MON & THU 11/17/18   [provider]  traMADol  (ULTRAM ) 50 MG tablet Take 1 tablet (50 mg total) by mouth every 12 (twelve) hours as needed. 11/13/21   Sandie Cross, PA-C      Allergies    Atorvastatin, Hydralazine , Metformin, Pravastatin, Rosuvastatin , Latex, Molnupiravir, Sulfasalazine, and Sulfonamide derivatives    Review of Systems   Review of Systems  Constitutional:  Negative for chills and fever.  HENT:  Negative for congestion.   Eyes:  Negative for visual disturbance.  Respiratory:  Negative for shortness of breath.  Cardiovascular:  Negative for chest pain.  Gastrointestinal:  Negative for abdominal pain and vomiting.  Genitourinary:  Negative for dysuria and flank pain.  Musculoskeletal:  Positive for back pain. Negative for neck pain and neck stiffness.  Skin:  Negative for rash.  Neurological:  Negative for syncope, weakness, light-headedness and headaches.    Physical Exam Updated Vital Signs BP (!) 144/65 (BP Location: Right Arm)   Pulse 61   Temp 98 F (36.7 C)   Resp 18   Ht 5\' 4"  (1.626 m)   Wt 71.7 kg   SpO2 98%   BMI 27.12 kg/m  Physical  Exam Vitals and nursing note reviewed.  Constitutional:      General: She is not in acute distress.    Appearance: She is well-developed.  HENT:     Head: Normocephalic and atraumatic.     Mouth/Throat:     Mouth: Mucous membranes are moist.  Eyes:     General:        Right eye: No discharge.        Left eye: No discharge.     Conjunctiva/sclera: Conjunctivae normal.  Neck:     Trachea: No tracheal deviation.  Cardiovascular:     Rate and Rhythm: Normal rate and regular rhythm.     Heart sounds: No murmur heard. Pulmonary:     Effort: Pulmonary effort is normal.     Breath sounds: Normal breath sounds.  Abdominal:     General: There is no distension.     Palpations: Abdomen is soft.     Tenderness: There is no abdominal tenderness. There is no guarding.  Musculoskeletal:        General: Tenderness present. No swelling or deformity.     Cervical back: Normal range of motion and neck supple. No rigidity.     Comments: Patient has no midline cervical thoracic or lumbar tenderness, full range of motion in neck without pain.  Patient has mild discomfort with flexion palpation anterior right shoulder no deformity.  No pain with internal/external rotation.  No pain with ambulating in the hips or knees.  Normal strength bilateral.  Skin:    General: Skin is warm.     Capillary Refill: Capillary refill takes less than 2 seconds.     Findings: No rash.  Neurological:     General: No focal deficit present.     Mental Status: She is alert.     Cranial Nerves: No cranial nerve deficit.  Psychiatric:        Mood and Affect: Mood normal.     ED Results / Procedures / Treatments   Labs (all labs ordered are listed, but only abnormal results are displayed) Labs Reviewed - No data to display  EKG None  Radiology DG Thoracic Spine 2 View Result Date: 06/30/2023 CLINICAL DATA:  Fall. Patient fell on Friday down steps. Struck back and head. Mild upper back pain. EXAM: THORACIC SPINE 2  VIEWS COMPARISON:  Chest radiograph 06/30/2023 FINDINGS: Cardiac pacemaker. Normal alignment of the thoracic spine. No focal bone lesion or bone destruction. No vertebral compression deformities. Mild endplate osteophyte formation. No paraspinal soft tissue swelling. Visualized ribs are grossly intact. Calcification of the aorta. IMPRESSION: Normal alignment of the thoracic spine. Mild degenerative changes. No acute bony abnormality seen. Electronically Signed   By: Boyce Byes M.D.   On: 06/30/2023 15:44   DG Chest 2 View Result Date: 06/30/2023 CLINICAL DATA:  Marvell Slider landing on back, hit head EXAM: CHEST -  2 VIEW COMPARISON:  06/15/2022 FINDINGS: Frontal and lateral views of the chest demonstrate a stable dual lead pacemaker. Cardiac silhouette is unremarkable. No acute airspace disease, effusion, or pneumothorax. There are no acute bony abnormalities. IMPRESSION: 1. No acute intrathoracic process. Electronically Signed   By: Bobbye Burrow M.D.   On: 06/30/2023 15:43    Procedures Procedures    Medications Ordered in ED Medications - No data to display  ED Course/ Medical Decision Making/ A&P                                 Medical Decision Making  Patient presents since low risk fall mechanical on Friday.  Fortunately patient is doing well with no significant signs or symptoms except for mild musculoskeletal tenderness.  X-rays ordered independently reviewed no acute fracture or dislocation.  CT scan head and neck were ordered however with patient doing well and symptoms resolved, no blood thinner use we had shared decision making and patient comfortable with canceling.  Highly unlikely intracranial bleeding with no syncope, vomiting, blood thinner use or headache.  Patient stable for discharge and ambulating well in the ER.        Final Clinical Impression(s) / ED Diagnoses Final diagnoses:  Fall, initial encounter  Acute head injury, initial encounter  Strain of right  shoulder, initial encounter    Rx / DC Orders ED Discharge Orders     None         Clay Cummins, MD 06/30/23 1557

## 2023-06-30 NOTE — ED Notes (Signed)
 Patient discharged by RN with no additional questions for RN. Patient ambulatory to lobby at time of discharge.

## 2023-06-30 NOTE — Discharge Instructions (Signed)
 Use Tylenol  every 4 hours and ice as needed for pain. Return the emergency room for vomiting, severe headache, lethargy, syncope/passing out or new concerns.

## 2023-07-08 ENCOUNTER — Ambulatory Visit (INDEPENDENT_AMBULATORY_CARE_PROVIDER_SITE_OTHER)

## 2023-07-08 ENCOUNTER — Ambulatory Visit (HOSPITAL_COMMUNITY)
Admission: EM | Admit: 2023-07-08 | Discharge: 2023-07-08 | Disposition: A | Discharge status: Home / Self Care | Attending: Physician Assistant | Admitting: Physician Assistant

## 2023-07-08 ENCOUNTER — Encounter (HOSPITAL_COMMUNITY): Payer: Self-pay

## 2023-07-08 DIAGNOSIS — W19XXXA Unspecified fall, initial encounter: Secondary | ICD-10-CM

## 2023-07-08 DIAGNOSIS — M79661 Pain in right lower leg: Secondary | ICD-10-CM | POA: Diagnosis not present

## 2023-07-08 DIAGNOSIS — M79631 Pain in right forearm: Secondary | ICD-10-CM

## 2023-07-08 DIAGNOSIS — M25511 Pain in right shoulder: Secondary | ICD-10-CM | POA: Diagnosis not present

## 2023-07-08 NOTE — ED Triage Notes (Signed)
 Pt states fell forward this am while in her garden and landing on rt arm. C/o bruising and pain to rt forearm and rt shin. Denies LOC.

## 2023-07-08 NOTE — ED Provider Notes (Signed)
 [facility]    CSN: [id number] Arrival date & time: 07/08/23  1154      History   Chief Complaint Chief Complaint  Patient presents with   Fall    HPI Lauren Holloway is a 89 y.o. female.   Patient presents today after falling this morning in her garden. She is unsure if she tripped but denies any LOC or lightheadedness, chest pain, shortness of breath prior to fall. She did not hit her head. She denies LOC or confusion after fall. She notes she hurt her right forearm, right shin and right shoulder during fall and have had some pain with movement of her right wrist, right shoulder and right lower leg since fall. She has a small bruise that has developed to right anterior lower leg and a more significant bruise with swelling to right wrist. She has not had any numbness or tingling.   The history is provided by the patient.  Fall Pertinent negatives include no abdominal pain and no shortness of breath.    Past Medical History:  Diagnosis Date   Cataract    Hyperlipidemia    Hypertension     Patient Active Problem List   Diagnosis Date Noted   Syncope 10/11/2020   Pacemaker 10/11/2020   AV heart block 11/20/2017   Pain in right hip 10/17/2017   Chronic left shoulder pain 03/17/2017   Grade 2 ankle sprain, unspecified laterality, subsequent encounter 05/28/2016   Dyspnea 10/25/2013   Hypertension 05/18/2010   ABDOMINAL PAIN RIGHT LOWER QUADRANT 05/18/2010   History of colonic polyps 05/18/2010    Past Surgical History:  Procedure Laterality Date   ABDOMINAL HYSTERECTOMY     APPENDECTOMY     BILATERAL OOPHORECTOMY     CATARACT EXTRACTION, BILATERAL Bilateral 1996   PACEMAKER IMPLANT N/A 11/24/2017   Procedure: PACEMAKER IMPLANT;  Surgeon: Tammie Fall, MD;  Location: [facility];  Service: Cardiovascular;  Laterality: N/A;    OB History   No obstetric history on file.      Home Medications    Prior to Admission medications   Medication  Sig Start Date End Date Taking? Authorizing Provider  acetaminophen  (TYLENOL ) 500 MG tablet Take 2 tablets (1,000 mg total) by mouth every 6 (six) hours as needed. 12/01/21   Starlene Eaton, FNP  Alirocumab (PRALUENT) 150 MG/ML SOAJ Inject 150 mg into the skin every 14 (fourteen) days.    [provider]  ALPRAZolam  (XANAX ) 0.5 MG tablet Take 0.25 mg by mouth 2 (two) times daily as needed for anxiety or sleep. 06/17/16   [provider]  amLODipine  (NORVASC ) 5 MG tablet TAKE 1 TABLET EVERY DAY (DOSE DECREASED) 12/05/22   Tammie Fall, MD  ciclopirox  (PENLAC ) 8 % solution Apply topically at bedtime. Apply thin amount to the toenail.  Try to avoid getting on the skin. Apply daily over previous coat. Remove weekly with polish remover and a nail file. 02/17/23   McCaughan, Dia D, DPM  escitalopram  (LEXAPRO ) 10 MG tablet Take 10 mg by mouth daily. 10/16/21   [provider]  Fish Oil-Cholecalciferol (FISH OIL + D3 PO) Take 1 tablet by mouth daily.     [provider]  lisinopril -hydrochlorothiazide  (PRINZIDE ,ZESTORETIC ) 20-25 MG per tablet Take 1 tablet by mouth daily. 05/03/14   [provider]  metoprolol tartrate (LOPRESSOR) 25 MG tablet Take 25 mg by mouth 2 (two) times daily.  03/18/13   [provider]  Multiple Vitamins-Minerals (MULTIVITAMIN WITH  MINERALS) tablet Take 1 tablet by mouth daily.    [provider]  potassium chloride (KLOR-CON M) 10 MEQ tablet Take 30 mEq by mouth daily. 12/10/21   [provider]  torsemide (DEMADEX) 20 MG tablet Take 1 tablet by mouth every morning. TAKES 2 TABS MON & THU 11/17/18   [provider]  traMADol  (ULTRAM ) 50 MG tablet Take 1 tablet (50 mg total) by mouth every 12 (twelve) hours as needed. 11/13/21   Sandie Cross, PA-C    Family History Family History  Problem Relation Age of Onset   Ovarian cancer Mother    Hypertension Mother    Hypertension Father    Heart  disease Father    Hypertension Sister    Hypertension Sister     Social History Social History   Tobacco Use   Smoking status: Never   Smokeless tobacco: Never  Vaping Use   Vaping status: Never Used  Substance Use Topics   Alcohol  use: Yes    Comment: MIX OCCASIONALLY   Drug use: No     Allergies   Atorvastatin, Hydralazine , Metformin, Pravastatin, Rosuvastatin , Latex, Molnupiravir, Sulfasalazine, and Sulfonamide derivatives   Review of Systems Review of Systems  Constitutional:  Negative for chills and fever.  Eyes:  Negative for discharge and redness.  Respiratory:  Negative for shortness of breath.   Gastrointestinal:  Negative for abdominal pain, nausea and vomiting.  Musculoskeletal:  Positive for arthralgias and myalgias.  Neurological:  Negative for numbness.     Physical Exam Triage Vital Signs ED Triage Vitals  Encounter Vitals Group     BP 07/08/23 1221 121/63     Systolic BP Percentile --      Diastolic BP Percentile --      Pulse Rate 07/08/23 1221 61     Resp 07/08/23 1221 18     Temp 07/08/23 1221 98 F (36.7 C)     Temp src --      SpO2 07/08/23 1221 93 %     Weight --      Height --      Head Circumference --      Peak Flow --      Pain Score 07/08/23 1222 6     Pain Loc --      Pain Education --      Exclude from Growth Chart --    No data found.  Updated Vital Signs BP 121/63 (BP Location: Left Arm)   Pulse 61   Temp 98 F (36.7 C)   Resp 18   SpO2 93%   Visual Acuity Right Eye Distance:   Left Eye Distance:   Bilateral Distance:    Right Eye Near:   Left Eye Near:    Bilateral Near:     Physical Exam Vitals and nursing note reviewed.  Constitutional:      General: She is not in acute distress.    Appearance: Normal appearance. She is not ill-appearing.  HENT:     Head: Normocephalic and atraumatic.  Eyes:     Conjunctiva/sclera: Conjunctivae normal.  Cardiovascular:     Rate and Rhythm: Normal rate.  Pulmonary:      Effort: Pulmonary effort is normal. No respiratory distress.  Musculoskeletal:     Comments: Decreased ROM of right wrist, shoulder due to pain, normal ROM of right elbow with pain in wrist with extremes of ROM. Area of bruising and swelling to ventral surface of right wrist/ forearm. Normal ROM of right  fingers. Normal gait  Skin:    Capillary Refill: Normal cap refill to right fingers Neurological:     Mental Status: She is alert.     Comments: Gross sensation intact to distal right fingers  Psychiatric:        Mood and Affect: Mood normal.        Behavior: Behavior normal.        Thought Content: Thought content normal.      UC Treatments / Results  Labs (all labs ordered are listed, but only abnormal results are displayed) Labs Reviewed - No data to display  EKG   Radiology DG Shoulder Right Result Date: 07/08/2023 CLINICAL DATA:  Right shoulder pain after fall today. EXAM: RIGHT SHOULDER - 2+ VIEW COMPARISON:  None Available. FINDINGS: There is no evidence of fracture or dislocation. Mild degenerative changes seen involving right acromioclavicular joint. Soft tissues are unremarkable. IMPRESSION: Mild degenerative joint disease of right acromioclavicular joint. No acute abnormality seen. Electronically Signed   By: Rosalene Colon M.D.   On: 07/08/2023 13:40   DG Wrist Complete Right Result Date: 07/08/2023 CLINICAL DATA:  Right wrist pain after fall today. EXAM: RIGHT WRIST - COMPLETE 3+ VIEW COMPARISON:  None Available. FINDINGS: There is no evidence of fracture or dislocation. Moderate degenerative changes seen involving first carpometacarpal joint. Soft tissues are unremarkable. IMPRESSION: Moderate osteoarthritis of first carpometacarpal joint. No acute abnormality seen. Electronically Signed   By: Rosalene Colon M.D.   On: 07/08/2023 13:39   DG Tibia/Fibula Right Result Date: 07/08/2023 CLINICAL DATA:  Right leg pain after fall today. EXAM: RIGHT TIBIA AND FIBULA - 2  VIEW COMPARISON:  None Available. FINDINGS: There is no evidence of fracture or other focal bone lesions. Soft tissues are unremarkable. IMPRESSION: Negative. Electronically Signed   By: Rosalene Colon M.D.   On: 07/08/2023 13:37    Procedures Procedures (including critical care time)  Medications Ordered in UC Medications - No data to display  Initial Impression / Assessment and Plan / UC Course  I have reviewed the triage vital signs and the nursing notes.  Pertinent labs & imaging results that were available during my care of the patient were reviewed by me and considered in my medical decision making (see chart for details).    Multiple xrays ordered- all without acute fracture. Suspect sprains and bruising. Advised OTC pain relief if needed with ice, compression to help with swelling and pain. Encouraged follow up with PCP if no gradual improvement or sooner follow up with any worsening. Patient expresses understanding.   Final Clinical Impressions(s) / UC Diagnoses   Final diagnoses:  Fall, initial encounter  Right forearm pain  Pain in right lower leg  Acute pain of right shoulder   Discharge Instructions   None    ED Prescriptions   None    PDMP not reviewed this encounter.   Vernestine Gondola, PA-C 07/08/23 1409

## 2023-07-25 NOTE — Progress Notes (Signed)
 Remote pacemaker transmission.

## 2023-09-11 ENCOUNTER — Ambulatory Visit: Payer: Self-pay | Admitting: Internal Medicine

## 2023-09-11 ENCOUNTER — Ambulatory Visit (INDEPENDENT_AMBULATORY_CARE_PROVIDER_SITE_OTHER): Payer: Medicare PPO

## 2023-09-11 DIAGNOSIS — I441 Atrioventricular block, second degree: Secondary | ICD-10-CM

## 2023-09-11 LAB — CUP PACEART REMOTE DEVICE CHECK
Battery Remaining Longevity: 20 mo
Battery Voltage: 2.88 V
Brady Statistic AP VP Percent: 77.06 %
Brady Statistic AP VS Percent: 0.02 %
Brady Statistic AS VP Percent: 22.88 %
Brady Statistic AS VS Percent: 0.04 %
Brady Statistic RA Percent Paced: 77.09 %
Brady Statistic RV Percent Paced: 99.94 %
Date Time Interrogation Session: 20250709221238
Implantable Lead Connection Status: 753985
Implantable Lead Connection Status: 753985
Implantable Lead Implant Date: 20190923
Implantable Lead Implant Date: 20190923
Implantable Lead Location: 753859
Implantable Lead Location: 753860
Implantable Lead Model: 3830
Implantable Lead Model: 5076
Implantable Pulse Generator Implant Date: 20190923
Lead Channel Impedance Value: 323 Ohm
Lead Channel Impedance Value: 342 Ohm
Lead Channel Impedance Value: 380 Ohm
Lead Channel Impedance Value: 399 Ohm
Lead Channel Pacing Threshold Amplitude: 0.5 V
Lead Channel Pacing Threshold Amplitude: 1.5 V
Lead Channel Pacing Threshold Pulse Width: 0.4 ms
Lead Channel Pacing Threshold Pulse Width: 0.4 ms
Lead Channel Sensing Intrinsic Amplitude: 1.125 mV
Lead Channel Sensing Intrinsic Amplitude: 1.125 mV
Lead Channel Sensing Intrinsic Amplitude: 10.5 mV
Lead Channel Sensing Intrinsic Amplitude: 10.5 mV
Lead Channel Setting Pacing Amplitude: 1.5 V
Lead Channel Setting Pacing Amplitude: 2.5 V
Lead Channel Setting Pacing Pulse Width: 1 ms
Lead Channel Setting Sensing Sensitivity: 1.2 mV
Zone Setting Status: 755011
Zone Setting Status: 755011

## 2023-11-12 ENCOUNTER — Encounter (HOSPITAL_COMMUNITY): Payer: Self-pay | Admitting: Emergency Medicine

## 2023-11-12 ENCOUNTER — Ambulatory Visit (INDEPENDENT_AMBULATORY_CARE_PROVIDER_SITE_OTHER)

## 2023-11-12 ENCOUNTER — Ambulatory Visit (HOSPITAL_COMMUNITY)
Admission: EM | Admit: 2023-11-12 | Discharge: 2023-11-12 | Disposition: A | Discharge status: Home / Self Care | Attending: Emergency Medicine | Admitting: Emergency Medicine

## 2023-11-12 DIAGNOSIS — M25551 Pain in right hip: Secondary | ICD-10-CM

## 2023-11-12 DIAGNOSIS — M160 Bilateral primary osteoarthritis of hip: Secondary | ICD-10-CM

## 2023-11-12 DIAGNOSIS — W010XXA Fall on same level from slipping, tripping and stumbling without subsequent striking against object, initial encounter: Secondary | ICD-10-CM | POA: Diagnosis not present

## 2023-11-12 MED ORDER — LIDOCAINE 5 % EX PTCH: 1.0000 | MEDICATED_PATCH | CUTANEOUS | 0 refills | Status: AC

## 2023-11-12 MED ORDER — TRIAMCINOLONE ACETONIDE 40 MG/ML IJ SUSP
40.0000 mg | Freq: Once | INTRAMUSCULAR | Status: AC
  Administered 2023-11-12: 40 mg via INTRAMUSCULAR

## 2023-11-12 MED ORDER — TRIAMCINOLONE ACETONIDE 40 MG/ML IJ SUSP
INTRAMUSCULAR | Status: AC
  Filled 2023-11-12: qty 1

## 2023-11-12 NOTE — ED Triage Notes (Addendum)
 Pt reports she fell over a cord that was in the floor when got up. Pt c/o right hip and pelvic pain esp with movement. Pt drove  herself her. Hasn't taken taken any medications for pain. Pt denies any LOC or taking blood thinners.   Pt reports went to ED first but was so busy in lobby left and came here.

## 2023-11-12 NOTE — Discharge Instructions (Signed)
 During your visit today, you received an injection of a high-dose steroidal anti-inflammatory medication that should significantly reduce your pain for the next 6 to 8 hours.   This evening, please begin taking Tylenol  1000 mg 3 times daily (every 8 hours).  Please know that It is safe to take a maximum 3000 mg of Tylenol  in a 24-hour period.  Please do not exceed this amount.  Tylenol  works best when taken on a scheduled basis.   I have also provided you with a prescription for lidocaine  patches.  You can wear each patch for a total of 12 hours, but please do not use more than 1 patch per day.  Please take it easy for the next few days.  Please let your fellow church members that this year you will need to be helping out with homecoming in [place] only.  As we discussed, please ask a family member to come help you get organized for your trip to [place] .  I hope you have a safe trip.  Thank you for visiting [facility] today.  We appreciate the opportunity to participate in your care.

## 2023-11-12 NOTE — ED Provider Notes (Signed)
 [facility]    CSN: [id number] Arrival date & time: 11/12/23  1831    HISTORY   Chief Complaint  Patient presents with   Fall   Hip Pain   Pelvic Pain   HPI Lauren Holloway is a pleasant, 89 y.o. female who presents to urgent care today. Patient states that she tripped over an electrical cord in her living room and fell on her right hip.  Patient complains of acute onset of pain in her right hip and pelvis especially when she is moving.  Patient states she was able to drive herself here to urgent care, initially went to the emergency room but decided not to stay because it looked too busy.  Patient states she did not hit her head when she fell, states she did not lose consciousness and is not currently taking any blood thinners.  Patient states she has had several recent falls at home and is concerned about her personal safety.  States she lives alone.  The history is provided by the patient.  Fall  Hip Pain  Pelvic Pain   Past Medical History:  Diagnosis Date   Cataract    Hyperlipidemia    Hypertension    Patient Active Problem List   Diagnosis Date Noted   Syncope 10/11/2020   Pacemaker 10/11/2020   AV heart block 11/20/2017   Pain in right hip 10/17/2017   Chronic left shoulder pain 03/17/2017   Grade 2 ankle sprain, unspecified laterality, subsequent encounter 05/28/2016   Dyspnea 10/25/2013   Hypertension 05/18/2010   ABDOMINAL PAIN RIGHT LOWER QUADRANT 05/18/2010   History of colonic polyps 05/18/2010   Past Surgical History:  Procedure Laterality Date   ABDOMINAL HYSTERECTOMY     APPENDECTOMY     BILATERAL OOPHORECTOMY     CATARACT EXTRACTION, BILATERAL Bilateral 1996   PACEMAKER IMPLANT N/A 11/24/2017   Procedure: PACEMAKER IMPLANT;  Surgeon: Waddell Danelle ORN, MD;  Location: [facility] INVASIVE [facility];  Service: Cardiovascular;  Laterality: N/A;   OB History   No obstetric history on file.    Home Medications    Prior to Admission medications    Medication Sig Start Date End Date Taking? Authorizing Provider  lidocaine  (LIDODERM ) 5 % Place 1 patch onto the skin daily for 7 days. Remove & Discard patch within 12 hours or as directed by MD 11/12/23 11/19/23 Yes Joesph Shaver Scales, PA-C  acetaminophen  (TYLENOL ) 500 MG tablet Take 2 tablets (1,000 mg total) by mouth every 6 (six) hours as needed. 12/01/21   Enedelia Dorna HERO, FNP  Alirocumab (PRALUENT) 150 MG/ML SOAJ Inject 150 mg into the skin every 14 (fourteen) days.    [provider]  amLODipine  (NORVASC ) 5 MG tablet TAKE 1 TABLET EVERY DAY (DOSE DECREASED) 12/05/22   Waddell Danelle ORN, MD  ciclopirox  (PENLAC ) 8 % solution Apply topically at bedtime. Apply thin amount to the toenail.  Try to avoid getting on the skin. Apply daily over previous coat. Remove weekly with polish remover and a nail file. 02/17/23   McCaughan, Dia D, DPM  escitalopram  (LEXAPRO ) 10 MG tablet Take 10 mg by mouth daily. 10/16/21   [provider]  Fish Oil-Cholecalciferol (FISH OIL + D3 PO) Take 1 tablet by mouth daily.     [provider]  lisinopril -hydrochlorothiazide  (PRINZIDE ,ZESTORETIC ) 20-25 MG per tablet Take 1 tablet by mouth daily. 05/03/14   [provider]  metoprolol tartrate (LOPRESSOR) 25 MG tablet Take 25 mg by mouth 2 (two) times daily.  03/18/13   [provider]  Multiple Vitamins-Minerals (MULTIVITAMIN WITH MINERALS) tablet Take 1 tablet by mouth daily.    [provider]  potassium chloride (KLOR-CON M) 10 MEQ tablet Take 30 mEq by mouth daily. 12/10/21   [provider]  torsemide (DEMADEX) 20 MG tablet Take 1 tablet by mouth every morning. TAKES 2 TABS MON & THU 11/17/18   [provider]    Family History Family History  Problem Relation Age of Onset   Ovarian cancer Mother    Hypertension Mother    Hypertension Father    Heart disease Father    Hypertension Sister    Hypertension Sister    Social History Social  History   Tobacco Use   Smoking status: Never   Smokeless tobacco: Never  Vaping Use   Vaping status: Never Used  Substance Use Topics   Alcohol  use: Yes    Comment: MIX OCCASIONALLY   Drug use: No   Allergies   Atorvastatin, Hydralazine , Metformin, Pravastatin, Rosuvastatin , Latex, Molnupiravir, Sulfasalazine, and Sulfonamide derivatives  Review of Systems Review of Systems  Genitourinary:  Positive for pelvic pain.   Pertinent findings revealed after performing a 14 point review of systems has been noted in the history of present illness.  Physical Exam Vital Signs BP (!) 118/56 (BP Location: Right Arm)   Pulse 60   Temp 98.1 F (36.7 C) (Oral)   Resp 18   SpO2 91%   No data found.  Physical Exam Vitals and nursing note reviewed.  Constitutional:      General: She is awake. She is not in acute distress.    Appearance: Normal appearance. She is well-developed and well-groomed. She is not ill-appearing.  Musculoskeletal:     Lumbar back: Normal.     Right hip: Tenderness, bony tenderness and crepitus present. No deformity. Normal range of motion. Decreased strength.     Right upper leg: Normal.  Neurological:     Mental Status: She is alert.  Psychiatric:        Behavior: Behavior is cooperative.     UC Couse / Diagnostics / Procedures:     Radiology DG Hip Unilat With Pelvis 2-3 Views Right Result Date: 11/12/2023 CLINICAL DATA:  Right hip pain. EXAM: DG HIP (WITH OR WITHOUT PELVIS) 2-3V RIGHT COMPARISON:  None Available. FINDINGS: No acute fracture or dislocation. The bones are osteopenic. Mild bilateral hip arthritic changes. The soft tissues are unremarkable IMPRESSION: 1. No acute fracture or dislocation. 2. Mild bilateral hip arthritic changes. Electronically Signed   By: Vanetta Chou M.D.   On: 11/12/2023 19:57    Procedures Procedures (including critical care time) EKG  Pending results:  Labs Reviewed - No data to display  Medications Ordered  in UC: Medications  triamcinolone  acetonide (KENALOG -40) injection 40 mg (40 mg Intramuscular Given 11/12/23 2008)    UC Diagnoses / Final Clinical Impressions(s)   I have reviewed the triage vital signs and the nursing notes.  Pertinent labs & imaging results that were available during my care of the patient were reviewed by me and considered in my medical decision making (see chart for details).    Final diagnoses:  Pain in right hip  Fall due to stumbling, initial encounter  Primary osteoarthritis of both hips   Patient provided with a copy of her radiology report.  Patient advised imaging is concerning for mild osteoarthritis, which was remarkable for someone of her age as well as no acute fracture or dislocation which  is also remarkable for someone of her age.  Given patient's history of fall as well as possible decreased kidney function based on review of her EMR, we will avoid sedating and renally metabolized medications at this time.  Recommend Tylenol  1000 mg every 8 hours and lidocaine  patches daily for 12 hours only.  Patient advised to follow-up with primary care provider if no improvement in the next 5 to 7 days.  Emergency precautions advised.  Please see discharge instructions below for details of plan of care as provided to patient. ED Prescriptions     Medication Sig Dispense Auth. Provider   lidocaine  (LIDODERM ) 5 % Place 1 patch onto the skin daily for 7 days. Remove & Discard patch within 12 hours or as directed by MD 7 patch Joesph Shaver Scales, PA-C      I have reviewed the PDMP during this encounter.    Discharge Instructions      During your visit today, you received an injection of a high-dose steroidal anti-inflammatory medication that should significantly reduce your pain for the next 6 to 8 hours.   This evening, please begin taking Tylenol  1000 mg 3 times daily (every 8 hours).  Please know that It is safe to take a maximum 3000 mg of Tylenol  in a  24-hour period.  Please do not exceed this amount.  Tylenol  works best when taken on a scheduled basis.   I have also provided you with a prescription for lidocaine  patches.  You can wear each patch for a total of 12 hours, but please do not use more than 1 patch per day.  Please take it easy for the next few days.  Please let your fellow church members that this year you will need to be helping out with homecoming in [place] only.  As we discussed, please ask a family member to come help you get organized for your trip to [place] .  I hope you have a safe trip.  Thank you for visiting [facility] today.  We appreciate the opportunity to participate in your care.     Disposition Upon Discharge:  Condition: stable for discharge home Home: take medications as prescribed; routine discharge instructions as discussed; follow up as advised.  Patient presented with an acute illness with associated systemic symptoms and significant discomfort requiring urgent management. In my opinion, this is a condition that a prudent lay person (someone who possesses an average knowledge of health and medicine) may potentially expect to result in complications if not addressed urgently such as respiratory distress, impairment of bodily function or dysfunction of bodily organs.   Routine symptom specific, illness specific and/or disease specific instructions were discussed with the patient and/or caregiver at length.   As such, the patient has been evaluated and assessed, work-up was performed and treatment was provided in alignment with urgent care protocols and evidence based medicine.  Patient/parent/caregiver has been advised that the patient may require follow up for further testing and treatment if the symptoms continue in spite of treatment, as clinically indicated and appropriate.  Patient/parent/caregiver has been advised to report to orthopedic urgent care clinic or return to the [facility] or PCP in  3-5 days if no better; follow-up with orthopedics, PCP or the Emergency Department if new signs and symptoms develop or if the current signs or symptoms continue to change or worsen for further workup, evaluation and treatment as clinically indicated and appropriate  The patient will follow up with their current PCP if and  as advised. If the patient does not currently have a PCP we will have assisted them in obtaining one.   The patient may need specialty follow up if the symptoms continue, in spite of conservative treatment and management, for further workup, evaluation, consultation and treatment as clinically indicated and appropriate.  Patient/parent/caregiver verbalized understanding and agreement of plan as discussed.  All questions were addressed during visit.  Please see discharge instructions below for further details of plan.  This office note has been dictated using Teaching laboratory technician.  Unfortunately, this method of dictation can sometimes lead to typographical or grammatical errors.  I apologize for your inconvenience in advance if this occurs.  Please do not hesitate to reach out to me if clarification is needed.      Joesph Shaver Scales, PA-C 11/14/23 1338

## 2023-12-11 NOTE — Progress Notes (Signed)
 Remote PPM Transmission

## 2023-12-12 ENCOUNTER — Ambulatory Visit: Payer: Medicare PPO

## 2023-12-12 DIAGNOSIS — I441 Atrioventricular block, second degree: Secondary | ICD-10-CM

## 2023-12-12 LAB — CUP PACEART REMOTE DEVICE CHECK
Battery Remaining Longevity: 17 mo
Battery Voltage: 2.87 V
Brady Statistic AP VP Percent: 74.8 %
Brady Statistic AP VS Percent: 0.01 %
Brady Statistic AS VP Percent: 25.17 %
Brady Statistic AS VS Percent: 0.02 %
Brady Statistic RA Percent Paced: 74.81 %
Brady Statistic RV Percent Paced: 99.97 %
Date Time Interrogation Session: 20251009222318
Implantable Lead Connection Status: 753985
Implantable Lead Connection Status: 753985
Implantable Lead Implant Date: 20190923
Implantable Lead Implant Date: 20190923
Implantable Lead Location: 753859
Implantable Lead Location: 753860
Implantable Lead Model: 3830
Implantable Lead Model: 5076
Implantable Pulse Generator Implant Date: 20190923
Lead Channel Impedance Value: 342 Ohm
Lead Channel Impedance Value: 342 Ohm
Lead Channel Impedance Value: 399 Ohm
Lead Channel Impedance Value: 418 Ohm
Lead Channel Pacing Threshold Amplitude: 0.5 V
Lead Channel Pacing Threshold Amplitude: 1.625 V
Lead Channel Pacing Threshold Pulse Width: 0.4 ms
Lead Channel Pacing Threshold Pulse Width: 0.4 ms
Lead Channel Sensing Intrinsic Amplitude: 1.25 mV
Lead Channel Sensing Intrinsic Amplitude: 1.25 mV
Lead Channel Sensing Intrinsic Amplitude: 10.5 mV
Lead Channel Sensing Intrinsic Amplitude: 10.5 mV
Lead Channel Setting Pacing Amplitude: 1.5 V
Lead Channel Setting Pacing Amplitude: 2.5 V
Lead Channel Setting Pacing Pulse Width: 1 ms
Lead Channel Setting Sensing Sensitivity: 1.2 mV
Zone Setting Status: 755011
Zone Setting Status: 755011

## 2023-12-15 NOTE — Progress Notes (Signed)
 Remote PPM Transmission

## 2023-12-18 ENCOUNTER — Ambulatory Visit: Payer: Self-pay | Admitting: Internal Medicine

## 2023-12-19 ENCOUNTER — Encounter: Admitting: Orthopaedic Surgery

## 2024-03-11 ENCOUNTER — Ambulatory Visit: Admitting: Emergency Medicine

## 2024-03-12 ENCOUNTER — Ambulatory Visit: Payer: Medicare PPO

## 2024-03-12 DIAGNOSIS — I441 Atrioventricular block, second degree: Secondary | ICD-10-CM | POA: Diagnosis not present

## 2024-03-14 ENCOUNTER — Ambulatory Visit: Payer: Self-pay | Admitting: Cardiology

## 2024-03-14 LAB — CUP PACEART REMOTE DEVICE CHECK
Battery Remaining Longevity: 14 mo
Battery Voltage: 2.84 V
Brady Statistic AP VP Percent: 72.74 %
Brady Statistic AP VS Percent: 0.23 %
Brady Statistic AS VP Percent: 26.82 %
Brady Statistic AS VS Percent: 0.21 %
Brady Statistic RA Percent Paced: 73.03 %
Brady Statistic RV Percent Paced: 99.56 %
Date Time Interrogation Session: 20260108232419
Implantable Lead Connection Status: 753985
Implantable Lead Connection Status: 753985
Implantable Lead Implant Date: 20190923
Implantable Lead Implant Date: 20190923
Implantable Lead Location: 753859
Implantable Lead Location: 753860
Implantable Lead Model: 3830
Implantable Lead Model: 5076
Implantable Pulse Generator Implant Date: 20190923
Lead Channel Impedance Value: 323 Ohm
Lead Channel Impedance Value: 342 Ohm
Lead Channel Impedance Value: 380 Ohm
Lead Channel Impedance Value: 418 Ohm
Lead Channel Pacing Threshold Amplitude: 0.5 V
Lead Channel Pacing Threshold Amplitude: 1.375 V
Lead Channel Pacing Threshold Pulse Width: 0.4 ms
Lead Channel Pacing Threshold Pulse Width: 0.4 ms
Lead Channel Sensing Intrinsic Amplitude: 1.25 mV
Lead Channel Sensing Intrinsic Amplitude: 1.25 mV
Lead Channel Sensing Intrinsic Amplitude: 8.75 mV
Lead Channel Sensing Intrinsic Amplitude: 8.75 mV
Lead Channel Setting Pacing Amplitude: 1.5 V
Lead Channel Setting Pacing Amplitude: 2.5 V
Lead Channel Setting Pacing Pulse Width: 1 ms
Lead Channel Setting Sensing Sensitivity: 1.2 mV
Zone Setting Status: 755011
Zone Setting Status: 755011

## 2024-03-15 ENCOUNTER — Encounter: Payer: Self-pay | Admitting: Emergency Medicine

## 2024-03-15 ENCOUNTER — Ambulatory Visit: Admitting: Emergency Medicine

## 2024-03-15 VITALS — BP 106/54 | HR 60 | Ht 64.0 in | Wt 157.0 lb

## 2024-03-15 DIAGNOSIS — I10 Essential (primary) hypertension: Secondary | ICD-10-CM

## 2024-03-15 DIAGNOSIS — E785 Hyperlipidemia, unspecified: Secondary | ICD-10-CM

## 2024-03-15 DIAGNOSIS — Z950 Presence of cardiac pacemaker: Secondary | ICD-10-CM

## 2024-03-15 DIAGNOSIS — I441 Atrioventricular block, second degree: Secondary | ICD-10-CM

## 2024-03-15 NOTE — Progress Notes (Signed)
 " Cardiology Office Note:    Date:  03/15/2024  ID:  Lauren Holloway, DOB 02-17-1932, MRN [id number] PCP: Lauren Anes, MD  [facility] Providers Cardiologist:  Maude Emmer, MD       Patient Profile:       Chief Complaint: 1 year follow-up History of Present Illness:  Lauren Holloway is a 89 y.o. female with visit-pertinent history of 2:1 AV block who is s/p PPM, hypertension, dyslipidemia  Echocardiogram 11/2017 showed LVEF 65 to 70%, mild LVH, no RWMA, trivial AI, mild MR, elevated PASP at 53 mmHg.  History of 2:1 AV block and alternating RBBB and LBBB with near syncope who is s/p PPM insertion in 2017.  Last seen in clinic on 01/06/2023 by Dr. Waddell.  She was doing well and no changes were made.   Discussed the use of AI scribe software for clinical note transcription with the patient, who gave verbal consent to proceed.  History of Present Illness Lauren Holloway is a 89 year old female with heart block and pacemaker placement who presents for cardiology follow-up. She was previously under the care of Dr. Waddell, who has retired.  Today she is doing well without acute cardiovascular concerns or complaints.  She is on metoprolol, lisinopril -hydrochlorothiazide , and amlodipine  for blood pressure.  She uses Praluent injections for cholesterol control.  She has no chest pain, shortness of breath, dizziness, lightheadedness, palpitations, near-syncope, or syncope.  She does currently live with her daughter and remains independent. She was previously active with [employer] activities but has become less active recently.   Review of systems:  Please see the history of present illness. All other systems are reviewed and otherwise negative.      Studies Reviewed:    EKG Interpretation Date/Time:  Monday March 15 2024 08:17:04 EST Ventricular Rate:  60 PR Interval:  296 QRS Duration:  136 QT Interval:  480 QTC Calculation: 480 R Axis:   -39  Text Interpretation: AV  dual-paced rhythm with prolonged AV conduction When compared with ECG of 06-Jan-2023 14:06, No significant change was found Confirmed by Rana Dixon [phone]) on 03/15/2024 9:04:58 AM    Echocardiogram 11/21/2017 - Left ventricle: The cavity size was normal. Wall thickness was    increased in a pattern of mild LVH. Systolic function was    vigorous. The estimated ejection fraction was in the range of 65%    to 70%. Wall motion was normal; there were no regional wall    motion abnormalities. The study is not technically sufficient to    allow evaluation of LV diastolic function.  - Aortic valve: There was trivial regurgitation.  - Mitral valve: There was mild regurgitation.  - Pulmonary arteries: Systolic pressure was moderately increased.    PA peak pressure: 53 mm Hg (S).   Risk Assessment/Calculations:              Physical Exam:   VS:  BP (!) 106/54 (BP Location: Left Arm, Patient Position: Sitting, Cuff Size: Normal)   Pulse 60   Ht 5' 4 (1.626 m)   Wt 157 lb (71.2 kg)   BMI 26.95 kg/m    Wt Readings from Last 3 Encounters:  03/15/24 157 lb (71.2 kg)  06/30/23 158 lb (71.7 kg)  01/06/23 158 lb (71.7 kg)    GEN: Well nourished, well developed in no acute distress NECK: No JVD; No carotid bruits CARDIAC: RRR, no murmurs, rubs, gallops RESPIRATORY:  Clear to auscultation without rales, wheezing or rhonchi  ABDOMEN: Soft, non-tender, non-distended EXTREMITIES:  No edema; No acute deformity      Assessment and Plan:  2:1 heart block History of heart block with alternating RBBB and LBBB s/p PPM  - Most recent PaceArt normal on 03/14/2024 - No syncope, presyncope, lightheadedness, dizziness - Former Dr. Waddell patient, will have her establish with Dr. Kennyth for EP  Hypertension Blood pressure today is well-controlled at 106/54 - Continue amlodipine  5 mg daily, lisinopril -hydrochlorothiazide  20-25 mg daily, metoprolol tartrate 25 mg twice daily, torsemide 20 mg  daily  Hyperlipidemia LDL 86 on 04/2023 - Managed on Praluent by her PCP       Dispo: Establish care with Dr. Kennyth within the next 3 months.  Follow-up with Dr. Nishan in 1 year.  Signed, Lauren LITTIE Louis, NP  "

## 2024-03-15 NOTE — Patient Instructions (Addendum)
 Medication Instructions:  NO CHANGES  Lab Work: NONE TO BE DONE TODAY.  Testing/Procedures: NONE  Follow-Up: At [facility], you and your health needs are our priority.  As part of our continuing mission to provide you with exceptional heart care, our providers are all part of one team.  This team includes your primary Cardiologist (physician) and Advanced Practice Providers or APPs (Physician Assistants and Nurse Practitioners) who all work together to provide you with the care you need, when you need it.  Your next appointment:   ESTABLISH CARE WITH DR. PARKER IN EP IN 3 MONTHS (FORMER DR. WADDELL PATIENT)  Provider:   MADISON FOUNTAIN, NP  We recommend signing up for the patient portal called MyChart.  Sign up information is provided on this After Visit Summary.  MyChart is used to connect with patients for Virtual Visits (Telemedicine).  Patients are able to view lab/test results, encounter notes, upcoming appointments, etc.  Non-urgent messages can be sent to your provider as well.   To learn more about what you can do with MyChart, go to [email or web address].   Other Instructions:

## 2024-03-16 NOTE — Progress Notes (Signed)
 Remote PPM Transmission

## 2024-03-24 ENCOUNTER — Encounter: Payer: Self-pay | Admitting: Podiatry

## 2024-03-24 ENCOUNTER — Ambulatory Visit: Admitting: Podiatry

## 2024-03-24 DIAGNOSIS — M79674 Pain in right toe(s): Secondary | ICD-10-CM | POA: Diagnosis not present

## 2024-03-24 DIAGNOSIS — B351 Tinea unguium: Secondary | ICD-10-CM | POA: Diagnosis not present

## 2024-03-24 DIAGNOSIS — M79675 Pain in left toe(s): Secondary | ICD-10-CM | POA: Diagnosis not present

## 2024-03-29 NOTE — Progress Notes (Signed)
"  °  Subjective:  Patient ID: Lauren Holloway, female    DOB: [birth year],  MRN: [id number]  Lauren Holloway presents to clinic today for thick, elongated toenails left great toe which are tender when wearing enclosed shoe gear.  Patient states toenail is very tender. Denies any drainage or swelling. Has used ciclopirox  in the past. Chief Complaint  Patient presents with   Toe Pain    Patient presents today for Left hallux toe pain. Ongoing for a few months    New problem(s): None.   PCP is Shayne Anes, MD.  Allergies[1]  Review of Systems: Negative except as noted in the HPI.  Objective: No changes noted in today's physical examination. There were no vitals filed for this visit. Lauren Holloway is a pleasant 89 y.o. female in NAD. AAO x 3.  Vascular Examination: Capillary refill time immediate b/l. Vascular status intact b/l with palpable DP pulses. Faintly palpable PT pulse right foot; nonpalpable PT left foot. Pedal hair absent b/l. No edema. No pain with calf compression b/l. Skin temperature gradient WNL b/l. No cyanosis or clubbing noted b/l. No ischemia or gangrene b/l.   Neurological Examination: Sensation grossly intact b/l with 10 gram monofilament.   Dermatological Examination: Pedal skin with normal turgor, texture and tone b/l.  No open wounds. No interdigital macerations.   Toenails left great toe thick, discolored, lamellated, with subungual debris and pain on dorsal palpation.   No hyperkeratotic nor porokeratotic lesions.  Musculoskeletal Examination: Muscle strength 5/5 to all lower extremity muscle groups bilaterally. No pain, crepitus or joint limitation noted with ROM b/l LE. No gross bony pedal deformities b/l. Patient ambulates independently without assistive aids.  Radiographs: None  Assessment/Plan: 1. Pain due to onychomycosis of toenails of both feet   Patient was evaluated and treated. All patient's and/or POA's questions/concerns addressed on today's  visit. Toenails left great toe debrided in length and girth without incident. Patient noted relief post debridement. Continue soft, supportive shoe gear daily. Report any pedal injuries to medical professional. Call office if there are any questions/concerns. -Patient/POA to call should there be question/concern in the interim.   Return in about 3 months (around 06/22/2024).  Lauren Holloway, DPM      [facility] LOCATION: [address] [place] [address]                   Office [phone]    LOCATION: [address] [place], [place] [address] Office [phone]     [1]  Allergies Allergen Reactions   Atorvastatin     Other Reaction(s): aching in legs   Hydralazine      Other Reaction(s): severe fatigue. couldn't do anything   Metformin     Other reaction(s): GI Upset (intolerance)  Other Reaction(s): GI Intolerance, upset stomach   Pravastatin     Other Reaction(s): joint pains 2016   Rosuvastatin      Other Reaction(s): all sorrts of joint pains.   Latex Rash    Other Reaction(s): hives   Molnupiravir Rash   Sulfasalazine Other (See Comments)    Other reaction(s): Other (See Comments)  HIVES   Sulfonamide Derivatives Rash   "

## 2024-06-15 ENCOUNTER — Ambulatory Visit: Admitting: Cardiology

## 2024-06-30 ENCOUNTER — Ambulatory Visit: Admitting: Podiatry
